# Patient Record
Sex: Female | Born: 1995 | Race: Black or African American | Hispanic: No | Marital: Single | Smoking: Never smoker
Health system: Southern US, Community
[De-identification: ages and names within clinical notes are randomized; demographics above are authoritative.]

## PROBLEM LIST (undated history)

## (undated) ENCOUNTER — Inpatient Hospital Stay: Payer: Self-pay

## (undated) DIAGNOSIS — F419 Anxiety disorder, unspecified: Secondary | ICD-10-CM

## (undated) DIAGNOSIS — K219 Gastro-esophageal reflux disease without esophagitis: Secondary | ICD-10-CM

## (undated) DIAGNOSIS — D649 Anemia, unspecified: Secondary | ICD-10-CM

## (undated) DIAGNOSIS — F1991 Other psychoactive substance use, unspecified, in remission: Secondary | ICD-10-CM

## (undated) DIAGNOSIS — F329 Major depressive disorder, single episode, unspecified: Secondary | ICD-10-CM

## (undated) DIAGNOSIS — Z8619 Personal history of other infectious and parasitic diseases: Secondary | ICD-10-CM

## (undated) DIAGNOSIS — R2990 Unspecified symptoms and signs involving the nervous system: Secondary | ICD-10-CM

## (undated) DIAGNOSIS — Z87898 Personal history of other specified conditions: Secondary | ICD-10-CM

## (undated) DIAGNOSIS — G459 Transient cerebral ischemic attack, unspecified: Secondary | ICD-10-CM

## (undated) DIAGNOSIS — K8020 Calculus of gallbladder without cholecystitis without obstruction: Secondary | ICD-10-CM

## (undated) DIAGNOSIS — F32A Depression, unspecified: Secondary | ICD-10-CM

## (undated) DIAGNOSIS — M7918 Myalgia, other site: Secondary | ICD-10-CM

## (undated) HISTORY — PX: NO PAST SURGERIES: SHX2092

## (undated) HISTORY — DX: Other psychoactive substance use, unspecified, in remission: F19.91

## (undated) HISTORY — DX: Personal history of other specified conditions: Z87.898

---
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
## 2013-01-03 ENCOUNTER — Emergency Department: Payer: Self-pay | Admitting: Emergency Medicine

## 2013-01-07 ENCOUNTER — Emergency Department: Payer: Self-pay | Admitting: Emergency Medicine

## 2013-01-09 LAB — BETA STREP CULTURE(ARMC)

## 2013-04-30 ENCOUNTER — Emergency Department: Payer: Self-pay | Admitting: Emergency Medicine

## 2013-04-30 LAB — CBC WITH DIFFERENTIAL/PLATELET
BASOS ABS: 0.1 10*3/uL (ref 0.0–0.1)
BASOS PCT: 0.4 %
EOS ABS: 0 10*3/uL (ref 0.0–0.7)
Eosinophil %: 0.1 %
HCT: 36.7 % (ref 35.0–47.0)
HGB: 12.1 g/dL (ref 12.0–16.0)
LYMPHS PCT: 7.8 %
Lymphocyte #: 1.4 10*3/uL (ref 1.0–3.6)
MCH: 25.8 pg — ABNORMAL LOW (ref 26.0–34.0)
MCHC: 32.9 g/dL (ref 32.0–36.0)
MCV: 78 fL — ABNORMAL LOW (ref 80–100)
MONOS PCT: 7.4 %
Monocyte #: 1.3 x10 3/mm — ABNORMAL HIGH (ref 0.2–0.9)
NEUTROS PCT: 84.3 %
Neutrophil #: 15.2 10*3/uL — ABNORMAL HIGH (ref 1.4–6.5)
Platelet: 342 10*3/uL (ref 150–440)
RBC: 4.69 10*6/uL (ref 3.80–5.20)
RDW: 14.1 % (ref 11.5–14.5)
WBC: 18 10*3/uL — AB (ref 3.6–11.0)

## 2013-04-30 LAB — COMPREHENSIVE METABOLIC PANEL
AST: 31 U/L — AB (ref 0–26)
Albumin: 3.3 g/dL — ABNORMAL LOW (ref 3.8–5.6)
Alkaline Phosphatase: 130 U/L — ABNORMAL HIGH
Anion Gap: 6 — ABNORMAL LOW (ref 7–16)
BILIRUBIN TOTAL: 0.4 mg/dL (ref 0.2–1.0)
BUN: 8 mg/dL (ref 7–18)
CO2: 24 mmol/L (ref 21–32)
CREATININE: 0.87 mg/dL (ref 0.60–1.30)
Calcium, Total: 8.9 mg/dL — ABNORMAL LOW (ref 9.0–10.7)
Chloride: 103 mmol/L (ref 98–107)
EGFR (African American): >60
EGFR (Non-African Amer.): >60
GLUCOSE: 108 mg/dL — AB (ref 65–99)
OSMOLALITY: 265 (ref 275–301)
Potassium: 3.4 mmol/L — ABNORMAL LOW (ref 3.5–5.1)
SGPT (ALT): 57 U/L (ref 12–78)
Sodium: 133 mmol/L — ABNORMAL LOW (ref 136–145)
TOTAL PROTEIN: 8.9 g/dL — AB (ref 6.4–8.6)

## 2013-04-30 LAB — GC/CHLAMYDIA PROBE AMP

## 2013-04-30 LAB — MONONUCLEOSIS SCREEN: Mono Test: NEGATIVE

## 2013-05-02 LAB — BETA STREP CULTURE(ARMC)

## 2013-05-11 ENCOUNTER — Emergency Department: Payer: Self-pay | Admitting: Emergency Medicine

## 2013-05-11 LAB — CBC
HCT: 33.4 % — ABNORMAL LOW (ref 35.0–47.0)
HGB: 11 g/dL — ABNORMAL LOW (ref 12.0–16.0)
MCH: 25.7 pg — AB (ref 26.0–34.0)
MCHC: 32.8 g/dL (ref 32.0–36.0)
MCV: 78 fL — AB (ref 80–100)
PLATELETS: 351 10*3/uL (ref 150–440)
RBC: 4.26 10*6/uL (ref 3.80–5.20)
RDW: 14.1 % (ref 11.5–14.5)
WBC: 13.5 10*3/uL — AB (ref 3.6–11.0)

## 2013-05-11 LAB — MONONUCLEOSIS SCREEN: Mono Test: NEGATIVE

## 2014-02-08 ENCOUNTER — Emergency Department: Payer: Self-pay | Admitting: Emergency Medicine

## 2014-02-08 LAB — COMPREHENSIVE METABOLIC PANEL
ANION GAP: 9 (ref 7–16)
Albumin: 2.9 g/dL — ABNORMAL LOW (ref 3.4–5.0)
Alkaline Phosphatase: 121 U/L — ABNORMAL HIGH
BILIRUBIN TOTAL: 0.6 mg/dL (ref 0.2–1.0)
BUN: 6 mg/dL — ABNORMAL LOW (ref 7–18)
CHLORIDE: 99 mmol/L (ref 98–107)
CO2: 27 mmol/L (ref 21–32)
CREATININE: 1.07 mg/dL (ref 0.60–1.30)
Calcium, Total: 9.2 mg/dL (ref 8.5–10.1)
EGFR (Non-African Amer.): >60
Glucose: 122 mg/dL — ABNORMAL HIGH (ref 65–99)
OSMOLALITY: 269 (ref 275–301)
POTASSIUM: 3.2 mmol/L — AB (ref 3.5–5.1)
SGOT(AST): 33 U/L (ref 15–37)
SGPT (ALT): 52 U/L
SODIUM: 135 mmol/L — AB (ref 136–145)
Total Protein: 8.9 g/dL — ABNORMAL HIGH (ref 6.4–8.2)

## 2014-02-08 LAB — URINALYSIS, COMPLETE
Glucose,UR: NEGATIVE mg/dL (ref 0–75)
Ketone: NEGATIVE
Leukocyte Esterase: NEGATIVE
Nitrite: NEGATIVE
Ph: 5 (ref 4.5–8.0)
Protein: 100
RBC,UR: 13 /HPF (ref 0–5)
Specific Gravity: 1.031 (ref 1.003–1.030)
Squamous Epithelial: 1
WBC UR: 10 /HPF (ref 0–5)

## 2014-02-08 LAB — CBC WITH DIFFERENTIAL/PLATELET
BASOS PCT: 0.2 %
Basophil #: 0 10*3/uL (ref 0.0–0.1)
Eosinophil #: 0 10*3/uL (ref 0.0–0.7)
Eosinophil %: 0 %
HCT: 34.1 % — ABNORMAL LOW (ref 35.0–47.0)
HGB: 11 g/dL — AB (ref 12.0–16.0)
LYMPHS PCT: 6.5 %
Lymphocyte #: 0.9 10*3/uL — ABNORMAL LOW (ref 1.0–3.6)
MCH: 25.3 pg — ABNORMAL LOW (ref 26.0–34.0)
MCHC: 32.3 g/dL (ref 32.0–36.0)
MCV: 78 fL — ABNORMAL LOW (ref 80–100)
Monocyte #: 1.4 x10 3/mm — ABNORMAL HIGH (ref 0.2–0.9)
Monocyte %: 10 %
NEUTROS PCT: 83.3 %
Neutrophil #: 11.7 10*3/uL — ABNORMAL HIGH (ref 1.4–6.5)
Platelet: 267 10*3/uL (ref 150–440)
RBC: 4.36 10*6/uL (ref 3.80–5.20)
RDW: 14.5 % (ref 11.5–14.5)
WBC: 14.1 10*3/uL — ABNORMAL HIGH (ref 3.6–11.0)

## 2014-02-11 LAB — BETA STREP CULTURE(ARMC)

## 2014-04-18 ENCOUNTER — Emergency Department: Payer: Self-pay | Admitting: Emergency Medicine

## 2014-06-13 NOTE — H&P (Signed)
L&D Evaluation:  History:   HPI 19 yo G1 [facility]648[facility]0d [facility] presenting with abdominal pain and labial swelling.  Also initially reported decreased fetal movement but has felt good movement ever since presenting to L&D.  No LOF, no VB.  Pain described as bilateral flank pain radiating into groin, exacerbated by standing and walking.  Radiating into vagina as well.    Presents with abdominal pain, vaginal swelling    Patient's Medical History No Chronic Illness    Patient's Surgical History none    Medications Pre Natal Vitamins    Allergies NKDA   ROS:   ROS All systems were reviewed.  HEENT, CNS, GI, GU, Respiratory, CV, Renal and Musculoskeletal systems were found to be normal.   Exam:   Vital Signs stable    Urine Protein UA    General no apparent distress    Mental Status clear    Abdomen gravid, non-tender    Estimated Fetal Weight Average for gestational age    Back no CVAT    Edema No lower extremity edema, some right labial edema, no erythema, no tenderness, no fluctulance.  Appears consistent with dependent edema  as opposed to infection    FHT normal rate with no decels    Ucx absent   Impression:   Impression Round ligament pain, labial swelling   Plan:   Comments 1) Round ligament pain - discussed supportive measures, warm packs, tylenol 2) Labial swelling - looks consistent with dependent edema.  Discussed cold compressed pelvic rest to avoid irritating further.  If patient notes worsening of swelling, or development of fevers, discharge discussed representing to re-evaluation 3) UA - pending to r/o possible UTI as contributing to patient abomdinal pain. 4) F/U 11/4    Follow Up Appointment already scheduled   Electronic Signatures for Addendum Section:  Lorrene ReidStaebler, Kasandra Fehr M (MD) (Signed Addendum 20-Oct-13 17:43)  Rx macrobid for possible UTI, UCx sent   Electronic Signatures: Lorrene ReidStaebler, Joshoa Shawler M (MD)  (Signed 20-Oct-13  17:43)  Authored: L&D Evaluation   Last Updated: 20-Oct-13 17:43 by Lorrene ReidStaebler, Roneisha Stern M (MD)

## 2014-06-13 NOTE — H&P (Signed)
L&D Evaluation:  History Expanded:   HPI 19 yo [facility] 3543[facility]6d [facility] with worsening contractions since 0500 and spontaneous rupture of membranes here on labo floor. No vaginal bleeding.   [facility]. No complications.    Gravida 1    Term 0    [facility]    Presents with contractions    Patient's Medical History No Chronic Illness    Patient's Surgical History none    Medications Pre Natal Vitamins    Allergies NKDA    Social History none    Family History Non-Contributory   ROS:   ROS All systems were reviewed.  HEENT, CNS, GI, GU, Respiratory, CV, Renal and Musculoskeletal systems were found to be normal.   Exam:   Vital Signs stable    General no apparent distress    Mental Status clear    Chest clear    Heart normal sinus rhythm    Abdomen gravid, tender with contractions    Estimated Fetal Weight Average for gestational age    Back no CVAT    Edema no edema    Reflexes 1+    Pelvic no external lesions, 6/100/0, spontaneous rupture of membranes forebag    Mebranes Ruptured    Description clear    FHT normal rate with no decels    Fetal Heart Rate 130    Ucx regular, absent    Ucx Frequency 2 min    Skin dry   Impression:   Impression active labor   Plan:   Plan EFM/NST, monitor contractions and for cervical change, fluids    Comments Epidural and pain meds Counseled on labor expectations    Follow Up Appointment already scheduled   Electronic Signatures: Letitia LibraHarris, Robert Paul (MD)  (Signed 21-Dec-13 08:57)  Authored: L&D Evaluation   Last Updated: 21-Dec-13 08:57 by Letitia LibraHarris, Robert Paul (MD)

## 2014-06-13 NOTE — H&P (Signed)
L&D Evaluation:  History:   HPI 19 yo G1P0 @ 22wks EDC 01/25/12 by 9 wk uls presents with vaginal bleeding.  She had spotting with wiping this morning.  Reports bleeding significantly increased later and she passed a clot.  Had bleeding that ran down her leg.  Denies sex or anything else in her vagina.  +FM.  Denies pain or other symptoms.    Presents with vaginal bleeding    Patient's Medical History No Chronic Illness    Patient's Surgical History none    Medications Pre Natal Vitamins    Allergies NKDA    Social History none    Family History Breast cancer, DM, thyroid disease   ROS:   ROS see HPI, all others reviewed and neg   Exam:   Vital Signs stable    Urine Protein not completed    General no apparent distress    Mental Status clear    Abdomen gravid, non-tender    Edema no edema    Pelvic cervix closed and thick, no bleeding in vagina or cervix, no pooling, neg nitrazine.  1cm superficial laceration left labia minora with active bleeding    Mebranes Intact    FHT 150s, reassuring for EGA    Ucx absent    Skin dry   Impression:   Impression Labial laceration   Plan:   Comments Pt reported that she washed herself vigorously during bathing yesterday and "scratched herself".  Denies recent sex.  Conservative management.  Superficial lac with no need for repair.  Keep area clean and dry.  Return with worsening bleeding or other concerns.   Electronic Signatures: Senaida LangeWeaver-Lee, Jessica Randall (MD)  (Signed 17-Aug-13 18:00)  Authored: L&D Evaluation   Last Updated: 17-Aug-13 18:00 by Senaida LangeWeaver-Lee, Jessica Randall (MD)

## 2014-08-08 ENCOUNTER — Encounter: Payer: Self-pay | Admitting: Emergency Medicine

## 2014-08-08 ENCOUNTER — Emergency Department: Payer: Medicaid Other

## 2014-08-08 ENCOUNTER — Emergency Department
Admission: EM | Admit: 2014-08-08 | Discharge: 2014-08-08 | Disposition: A | Discharge status: Home / Self Care | Payer: Medicaid Other | Attending: Emergency Medicine | Admitting: Emergency Medicine

## 2014-08-08 DIAGNOSIS — Z3A08 8 weeks gestation of pregnancy: Secondary | ICD-10-CM | POA: Diagnosis not present

## 2014-08-08 DIAGNOSIS — O200 Threatened abortion: Secondary | ICD-10-CM | POA: Insufficient documentation

## 2014-08-08 DIAGNOSIS — O209 Hemorrhage in early pregnancy, unspecified: Secondary | ICD-10-CM | POA: Diagnosis present

## 2014-08-08 LAB — ABO/RH: ABO/RH(D): A POS

## 2014-08-08 LAB — CHLAMYDIA/NGC RT PCR (ARMC ONLY)
Chlamydia Tr: NOT DETECTED
N GONORRHOEAE: NOT DETECTED

## 2014-08-08 LAB — CBC WITH DIFFERENTIAL/PLATELET
Basophils Absolute: 0 10*3/uL (ref 0–0.1)
Basophils Relative: 0 %
EOS PCT: 1 %
Eosinophils Absolute: 0.1 10*3/uL (ref 0–0.7)
HCT: 34.1 % — ABNORMAL LOW (ref 35.0–47.0)
Hemoglobin: 11.3 g/dL — ABNORMAL LOW (ref 12.0–16.0)
Lymphocytes Relative: 17 %
Lymphs Abs: 1.7 10*3/uL (ref 1.0–3.6)
MCH: 25.7 pg — ABNORMAL LOW (ref 26.0–34.0)
MCHC: 33.3 g/dL (ref 32.0–36.0)
MCV: 77 fL — ABNORMAL LOW (ref 80.0–100.0)
Monocytes Absolute: 0.8 10*3/uL (ref 0.2–0.9)
Monocytes Relative: 8 %
NEUTROS PCT: 74 %
Neutro Abs: 7.4 10*3/uL — ABNORMAL HIGH (ref 1.4–6.5)
PLATELETS: 243 10*3/uL (ref 150–440)
RBC: 4.42 MIL/uL (ref 3.80–5.20)
RDW: 16 % — AB (ref 11.5–14.5)
WBC: 10 10*3/uL (ref 3.6–11.0)

## 2014-08-08 LAB — COMPREHENSIVE METABOLIC PANEL
ALBUMIN: 3.4 g/dL — AB (ref 3.5–5.0)
ALK PHOS: 53 U/L (ref 38–126)
ALT: 16 U/L (ref 14–54)
AST: 20 U/L (ref 15–41)
Anion gap: 7 (ref 5–15)
BUN: 7 mg/dL (ref 6–20)
CALCIUM: 9.1 mg/dL (ref 8.9–10.3)
CO2: 22 mmol/L (ref 22–32)
Chloride: 105 mmol/L (ref 101–111)
Creatinine, Ser: 0.6 mg/dL (ref 0.44–1.00)
GFR calc non Af Amer: >60 mL/min (ref >60)
Glucose, Bld: 82 mg/dL (ref 65–99)
Potassium: 3.6 mmol/L (ref 3.5–5.1)
Sodium: 134 mmol/L — ABNORMAL LOW (ref 135–145)
TOTAL PROTEIN: 7 g/dL (ref 6.5–8.1)
Total Bilirubin: 0.3 mg/dL (ref 0.3–1.2)

## 2014-08-08 LAB — WET PREP, GENITAL
Clue Cells Wet Prep HPF POC: NONE SEEN
Trich, Wet Prep: NONE SEEN
YEAST WET PREP: NONE SEEN

## 2014-08-08 LAB — TYPE AND SCREEN
ABO/RH(D): A POS
ANTIBODY SCREEN: NEGATIVE

## 2014-08-08 LAB — PREGNANCY, URINE: PREG TEST UR: POSITIVE — AB

## 2014-08-08 NOTE — ED Notes (Signed)
Patient reports light vaginal bleeding last two days that subsided. States bleeding today is like normal period. Patient states she had positive home pregnancy test. Did not have period last month, but unsure of how many weeks she is.

## 2014-08-08 NOTE — ED Provider Notes (Addendum)
[facility] Emergency Department Provider Note  ____________________________________________  Time seen: Approximately 1:15 PM  I have reviewed the triage vital signs and the nursing notes.   HISTORY  Chief Complaint Vaginal Bleeding    HPI Jessica Randall is a 19 y.o. female patient reports she missed her menstrual period last month and began having a little spotting 2 days ago stopped and has returned now with a little bit of cramping and bleeding about as much as as a normal menstrual period. She has not had this before   History reviewed. No pertinent past medical history.  There are no active problems to display for this patient.   History reviewed. No pertinent past surgical history.  No current outpatient prescriptions on file.  Allergies Review of patient's allergies indicates no known allergies.  No family history on file.  Social History History  Substance Use Topics  . Smoking status: Never Smoker   . Smokeless tobacco: Never Used  . Alcohol Use: No     Comment: occ.     Review of Systems Constitutional: No fever/chills Eyes: No visual changes. ENT: No sore throat. Cardiovascular: Denies chest pain. Respiratory: Denies shortness of breath. Gastrointestinal: No abdominal pain.  No nausea, no vomiting.  No diarrhea.  No constipation. Genitourinary: Negative for dysuria. Musculoskeletal: Negative for back pain. Skin: Negative for rash. Neurological: Negative for headaches, focal weakness or numbness.  10-point ROS otherwise negative.  ____________________________________________   PHYSICAL EXAM:  VITAL SIGNS: ED Triage Vitals  Enc Vitals Group     BP 08/08/14 1224 117/51 mmHg     Pulse Rate 08/08/14 1224 82     Resp 08/08/14 1224 18     Temp 08/08/14 1224 98.2 F (36.8 C)     Temp Source 08/08/14 1224 Oral     SpO2 08/08/14 1224 99 %     Weight 08/08/14 1224 170 lb (77.111 kg)     Height 08/08/14 1224 5\' 1"  (1.549 m)      Head Cir --      Peak Flow --      Pain Score 08/08/14 1225 5     Pain Loc --      Pain Edu? --      Excl. in GC? --     Constitutional: Alert and oriented. Well appearing and in no acute distress. Eyes: Conjunctivae are normal. PERRL. EOMI. Head: Atraumatic. Nose: No congestion/rhinnorhea. Mouth/Throat: Mucous membranes are moist.  Oropharynx non-erythematous. Neck: No stridor. Cardiovascular: Normal rate, regular rhythm. Grossly normal heart sounds.  Good peripheral circulation. Respiratory: Normal respiratory effort.  No retractions. Lungs CTAB. Gastrointestinal: Soft and nontender. No distention. No abdominal bruits. No CVA tenderness. Genitourinary: Normal introitus and vagina there is a very small amount of blood at the os. No discharge. Bimanual shows a closed cervix is no cervical motion tenderness uterus is slightly enlarged. There are no masses on palpation. Musculoskeletal: No lower extremity tenderness nor edema.  No joint effusions. Neurologic:  Normal speech and language. No gross focal neurologic deficits are appreciated. Speech is normal. No gait instability. Skin:  Skin is warm, dry and intact. No rash noted. Psychiatric: Mood and affect are normal. Speech and behavior are normal.  ____________________________________________   LABS (all labs ordered are listed, but only abnormal results are displayed)  Labs Reviewed  WET PREP, GENITAL - Abnormal; Notable for the following:    WBC, Wet Prep HPF POC FEW (*)    All other components within normal limits  COMPREHENSIVE METABOLIC  PANEL - Abnormal; Notable for the following:    Sodium 134 (*)    Albumin 3.4 (*)    All other components within normal limits  CBC WITH DIFFERENTIAL/PLATELET - Abnormal; Notable for the following:    Hemoglobin 11.3 (*)    HCT 34.1 (*)    MCV 77.0 (*)    MCH 25.7 (*)    RDW 16.0 (*)    Neutro Abs 7.4 (*)    All other components within normal limits  PREGNANCY, URINE - Abnormal;  Notable for the following:    Preg Test, Ur POSITIVE (*)    All other components within normal limits  CHLAMYDIA/NGC RT PCR ([facility] ONLY)  HCG, QUANTITATIVE, PREGNANCY  TYPE AND SCREEN  ABO/RH   ____________________________________________  EKG   ____________________________________________  RADIOLOGY  Ultrasound shows a normal IUP at 7 weeks and 6 days with no acute findings discussed with patient the fact that she will be diagnosed as having a threatened miscarriage will need to follow-up with OB Dr. Dalbert Garnet who could not O clinic is on-call only for the patient to Dr. Dalbert Garnet structure for threatened miscarriage provided ____________________________________________   PROCEDURES    ____________________________________________   INITIAL IMPRESSION / ASSESSMENT AND PLAN / ED COURSE  Pertinent labs & imaging results that were available during my care of the patient were reviewed by me and considered in my medical decision making (see chart for details).   ____________________________________________   FINAL CLINICAL IMPRESSION(S) / ED DIAGNOSES  Final diagnoses:  Threatened miscarriage in early pregnancy      Arnaldo Natal, MD 08/08/14 1618  Scheduled Dr. Dalbert Garnet who wishes to make sure that the patient's taking prenatal vitamins with folic acid I have given her prescription for that  Arnaldo Natal, MD 08/08/14 2350

## 2014-08-08 NOTE — Discharge Instructions (Signed)

## 2014-08-23 ENCOUNTER — Emergency Department
Admission: EM | Admit: 2014-08-23 | Discharge: 2014-08-23 | Disposition: A | Discharge status: Home / Self Care | Payer: Medicaid Other | Attending: Emergency Medicine | Admitting: Emergency Medicine

## 2014-08-23 ENCOUNTER — Encounter: Payer: Self-pay | Admitting: Urgent Care

## 2014-08-23 DIAGNOSIS — O200 Threatened abortion: Secondary | ICD-10-CM | POA: Insufficient documentation

## 2014-08-23 DIAGNOSIS — B9689 Other specified bacterial agents as the cause of diseases classified elsewhere: Secondary | ICD-10-CM

## 2014-08-23 DIAGNOSIS — O23591 Infection of other part of genital tract in pregnancy, first trimester: Secondary | ICD-10-CM | POA: Insufficient documentation

## 2014-08-23 DIAGNOSIS — O209 Hemorrhage in early pregnancy, unspecified: Secondary | ICD-10-CM | POA: Diagnosis present

## 2014-08-23 DIAGNOSIS — Z3A12 12 weeks gestation of pregnancy: Secondary | ICD-10-CM | POA: Diagnosis not present

## 2014-08-23 DIAGNOSIS — O2341 Unspecified infection of urinary tract in pregnancy, first trimester: Secondary | ICD-10-CM | POA: Diagnosis not present

## 2014-08-23 DIAGNOSIS — N390 Urinary tract infection, site not specified: Secondary | ICD-10-CM

## 2014-08-23 DIAGNOSIS — N760 Acute vaginitis: Secondary | ICD-10-CM

## 2014-08-23 LAB — CBC
HEMATOCRIT: 34.4 % — AB (ref 35.0–47.0)
Hemoglobin: 11.5 g/dL — ABNORMAL LOW (ref 12.0–16.0)
MCH: 25.6 pg — ABNORMAL LOW (ref 26.0–34.0)
MCHC: 33.2 g/dL (ref 32.0–36.0)
MCV: 77.1 fL — ABNORMAL LOW (ref 80.0–100.0)
PLATELETS: 268 10*3/uL (ref 150–440)
RBC: 4.47 MIL/uL (ref 3.80–5.20)
RDW: 16.7 % — AB (ref 11.5–14.5)
WBC: 12.3 10*3/uL — AB (ref 3.6–11.0)

## 2014-08-23 LAB — URINALYSIS COMPLETE WITH MICROSCOPIC (ARMC ONLY)
Bacteria, UA: NONE SEEN
Bilirubin Urine: NEGATIVE
Glucose, UA: NEGATIVE mg/dL
Hgb urine dipstick: NEGATIVE
Ketones, ur: NEGATIVE mg/dL
NITRITE: NEGATIVE
PH: 6 (ref 5.0–8.0)
Protein, ur: 30 mg/dL — AB
Specific Gravity, Urine: 1.032 — ABNORMAL HIGH (ref 1.005–1.030)

## 2014-08-23 LAB — CHLAMYDIA/NGC RT PCR (ARMC ONLY)
CHLAMYDIA TR: NOT DETECTED
N GONORRHOEAE: NOT DETECTED

## 2014-08-23 LAB — BASIC METABOLIC PANEL
Anion gap: 7 (ref 5–15)
BUN: 8 mg/dL (ref 6–20)
CHLORIDE: 106 mmol/L (ref 101–111)
CO2: 22 mmol/L (ref 22–32)
Calcium: 9.3 mg/dL (ref 8.9–10.3)
Creatinine, Ser: 0.69 mg/dL (ref 0.44–1.00)
GLUCOSE: 91 mg/dL (ref 65–99)
Potassium: 3.2 mmol/L — ABNORMAL LOW (ref 3.5–5.1)
Sodium: 135 mmol/L (ref 135–145)

## 2014-08-23 LAB — WET PREP, GENITAL
TRICH WET PREP: NONE SEEN
Yeast Wet Prep HPF POC: NONE SEEN

## 2014-08-23 LAB — HCG, QUANTITATIVE, PREGNANCY: hCG, Beta Chain, Quant, S: 262045 m[IU]/mL — ABNORMAL HIGH (ref <5)

## 2014-08-23 MED ORDER — METRONIDAZOLE 500 MG PO TABS: 500.0000 mg | ORAL_TABLET | Freq: Two times a day (BID) | ORAL | Status: AC

## 2014-08-23 MED ORDER — METRONIDAZOLE 500 MG PO TABS
500.0000 mg | ORAL_TABLET | Freq: Once | ORAL | Status: AC
  Administered 2014-08-23: 500 mg via ORAL
  Filled 2014-08-23: qty 1

## 2014-08-23 MED ORDER — CEPHALEXIN 500 MG PO CAPS
500.0000 mg | ORAL_CAPSULE | Freq: Once | ORAL | Status: AC
  Administered 2014-08-23: 500 mg via ORAL
  Filled 2014-08-23: qty 1

## 2014-08-23 MED ORDER — CEPHALEXIN 500 MG PO CAPS: 500.0000 mg | ORAL_CAPSULE | Freq: Four times a day (QID) | ORAL | Status: AC

## 2014-08-23 NOTE — ED Provider Notes (Signed)
[facility] Emergency Department Provider Note  ____________________________________________  Time seen: Approximately 945 PM  I have reviewed the triage vital signs and the nursing notes.   HISTORY  Chief Complaint Vaginal Bleeding and Abdominal Cramping    HPI Jessica Randall is a 19 y.o. female who is a G2 P1 at 5812 weeks gestation who presents with lower abdominal cramping as well as vaginal spotting. She is also complaining of a white to yellow discharge. She has a previous history of chlamydia but is not concerned for STDs at this time.  She said that she was spotting earlier in the day today but it since has stopped. Denies any dysuria. York SpanielSaid that this happened several weeks ago and was evaluated at this emergency department. Has a prescription for by mouth vitamins at home but has not filled it. [facility].   History reviewed. No pertinent past medical history.  There are no active problems to display for this patient.   History reviewed. No pertinent past surgical history.  No current outpatient prescriptions on file.  Allergies Review of patient's allergies indicates no known allergies.  No family history on file.  Social History History  Substance Use Topics  . Smoking status: Never Smoker   . Smokeless tobacco: Never Used  . Alcohol Use: No     Comment: occ.     Review of Systems Constitutional: No fever/chills Eyes: No visual changes. ENT: No sore throat. Cardiovascular: Denies chest pain. Respiratory: Denies shortness of breath. Gastrointestinal:   No nausea, no vomiting.  No diarrhea.  No constipation. Genitourinary: Negative for dysuria. Musculoskeletal: Negative for back pain. Skin: Negative for rash. Neurological: Negative for headaches, focal weakness or numbness.  10-point ROS otherwise negative.  ____________________________________________   PHYSICAL  EXAM:  VITAL SIGNS: ED Triage Vitals  Enc Vitals Group     BP 08/23/14 2110 111/46 mmHg     Pulse Rate 08/23/14 2110 93     Resp 08/23/14 2110 18     Temp 08/23/14 2110 98.6 F (37 C)     Temp src --      SpO2 08/23/14 2110 100 %     Weight 08/23/14 2110 167 lb (75.751 kg)     Height 08/23/14 2110 4\' 11"  (1.499 m)     Head Cir --      Peak Flow --      Pain Score 08/23/14 2110 5     Pain Loc --      Pain Edu? --      Excl. in GC? --     Constitutional: Alert and oriented. Well appearing and in no acute distress. Eyes: Conjunctivae are normal. PERRL. EOMI. Head: Atraumatic. Nose: No congestion/rhinnorhea. Mouth/Throat: Mucous membranes are moist.  Oropharynx non-erythematous. Neck: No stridor.   Cardiovascular: Normal rate, regular rhythm. Grossly normal heart sounds.  Good peripheral circulation. Respiratory: Normal respiratory effort.  No retractions. Lungs CTAB. Gastrointestinal: Soft and nontender. No distention. No abdominal bruits. No CVA tenderness. Genitourinary: Normal external exam. Speculum exam without any bleeding. Small amount of mucus-like discharge around the cervix. Bimanual exam with a closed cervix. Mild uterine tenderness without any bogginess. No adnexal masses or tenderness to palpation. Musculoskeletal: No lower extremity tenderness nor edema.  No joint effusions. Neurologic:  Normal speech and language. No gross focal neurologic deficits are appreciated. No gait instability. Skin:  Skin is warm, dry and intact. No rash noted. Psychiatric: Mood and affect are  normal. Speech and behavior are normal.  ____________________________________________   LABS (all labs ordered are listed, but only abnormal results are displayed)  Labs Reviewed  WET PREP, GENITAL - Abnormal; Notable for the following:    Clue Cells Wet Prep HPF POC FEW (*)    WBC, Wet Prep HPF POC MODERATE (*)    All other components within normal limits  CBC - Abnormal; Notable for the  following:    WBC 12.3 (*)    Hemoglobin 11.5 (*)    HCT 34.4 (*)    MCV 77.1 (*)    MCH 25.6 (*)    RDW 16.7 (*)    All other components within normal limits  BASIC METABOLIC PANEL - Abnormal; Notable for the following:    Potassium 3.2 (*)    All other components within normal limits  URINALYSIS COMPLETEWITH MICROSCOPIC ([facility] ONLY) - Abnormal; Notable for the following:    Color, Urine YELLOW (*)    APPearance CLEAR (*)    Specific Gravity, Urine 1.032 (*)    Protein, ur 30 (*)    Leukocytes, UA TRACE (*)    Squamous Epithelial / LPF 0-5 (*)    All other components within normal limits  CHLAMYDIA/NGC RT PCR ([facility] ONLY)  URINE CULTURE  HCG, QUANTITATIVE, PREGNANCY   ____________________________________________  EKG   ____________________________________________  RADIOLOGY  Bedside ultrasound done with fetal heart rate of 158. ____________________________________________   PROCEDURES    ____________________________________________   INITIAL IMPRESSION / ASSESSMENT AND PLAN / ED COURSE  Pertinent labs & imaging results that were available during my care of the patient were reviewed by me and considered in my medical decision making (see chart for details).  Trace leukocytes as well as few clue cells seen on the wet mount. Because this is the patient's second visit with spotting and cramping I we'll treat for both urinary tract infection and bacterial vaginosis. Patient will have prescription filled for her prenatal vitamins when she picks up these medicines. ____________________________________________   FINAL CLINICAL IMPRESSION(S) / ED DIAGNOSES  Acute threatened abortion. Acute urinary tract infection. Acute bacterial vaginosis. Return visit.    Myrna Blazer, MD 08/23/14 [phone]

## 2014-08-23 NOTE — ED Notes (Signed)
Patient presents with c/o vaginal bleeding and abdominal cramping that began earlier today. Of note, patient is a 12 week G2P1 who has previously been seen and evaluated here and advised of a threatened miscarriage.

## 2014-08-25 LAB — URINE CULTURE: Culture: NO GROWTH

## 2014-09-17 ENCOUNTER — Emergency Department: Payer: Medicaid Other

## 2014-09-17 ENCOUNTER — Encounter: Payer: Self-pay | Admitting: Emergency Medicine

## 2014-09-17 ENCOUNTER — Emergency Department
Admission: EM | Admit: 2014-09-17 | Discharge: 2014-09-17 | Disposition: A | Discharge status: Home / Self Care | Payer: Medicaid Other | Attending: Emergency Medicine | Admitting: Emergency Medicine

## 2014-09-17 DIAGNOSIS — R102 Pelvic and perineal pain: Secondary | ICD-10-CM | POA: Diagnosis not present

## 2014-09-17 DIAGNOSIS — O9989 Other specified diseases and conditions complicating pregnancy, childbirth and the puerperium: Secondary | ICD-10-CM | POA: Insufficient documentation

## 2014-09-17 DIAGNOSIS — O26891 Other specified pregnancy related conditions, first trimester: Secondary | ICD-10-CM

## 2014-09-17 DIAGNOSIS — Z3A16 16 weeks gestation of pregnancy: Secondary | ICD-10-CM | POA: Diagnosis not present

## 2014-09-17 LAB — URINALYSIS COMPLETE WITH MICROSCOPIC (ARMC ONLY)
BILIRUBIN URINE: NEGATIVE
Glucose, UA: NEGATIVE mg/dL
Hgb urine dipstick: NEGATIVE
NITRITE: NEGATIVE
PROTEIN: 30 mg/dL — AB
SPECIFIC GRAVITY, URINE: 1.032 — AB (ref 1.005–1.030)
pH: 5 (ref 5.0–8.0)

## 2014-09-17 LAB — CBC
HCT: 33.6 % — ABNORMAL LOW (ref 35.0–47.0)
Hemoglobin: 11.2 g/dL — ABNORMAL LOW (ref 12.0–16.0)
MCH: 26.1 pg (ref 26.0–34.0)
MCHC: 33.3 g/dL (ref 32.0–36.0)
MCV: 78.2 fL — ABNORMAL LOW (ref 80.0–100.0)
PLATELETS: 278 10*3/uL (ref 150–440)
RBC: 4.3 MIL/uL (ref 3.80–5.20)
RDW: 17.4 % — ABNORMAL HIGH (ref 11.5–14.5)
WBC: 11.9 10*3/uL — ABNORMAL HIGH (ref 3.6–11.0)

## 2014-09-17 LAB — HCG, QUANTITATIVE, PREGNANCY: hCG, Beta Chain, Quant, S: 65292 m[IU]/mL — ABNORMAL HIGH (ref <5)

## 2014-09-17 NOTE — ED Notes (Signed)
Patient reports lower abd cramping for 3 days.  Denies vaginal bleeding.

## 2014-09-17 NOTE — ED Provider Notes (Addendum)
[facility] Emergency Department Provider Note  ____________________________________________  Time seen: 3:20 AM  I have reviewed the triage vital signs and the nursing notes.   HISTORY  Chief Complaint Abdominal Pain     HPI Jessica Randall is a 19 y.o. female who reports she is proximally [redacted] weeks pregnant. She reports having left abdominal discomfort. This is worse when she first wakes up in the morning and is noticeable when she is up walking. She is concerned about her pregnancy. She has been seen in the emergency department twice in the past 5 weeks. 5 weeks ago she had some light spotting and was diagnosed with possible miscarriage. She had a subsequent visit to the emergency department towards the end of July and diagnosed with a urinary tract infection.  She denies any bleeding. She feels like she is having some cramping.    History reviewed. No pertinent past medical history.  There are no active problems to display for this patient.   History reviewed. No pertinent past surgical history.  No current outpatient prescriptions on file.  Allergies Review of patient's allergies indicates no known allergies.  History reviewed. No pertinent family history.  Social History Social History  Substance Use Topics  . Smoking status: Never Smoker   . Smokeless tobacco: Never Used  . Alcohol Use: No     Comment: occ.     Review of Systems  Constitutional: Negative for fever. ENT: Negative for sore throat. Cardiovascular: Negative for chest pain. Respiratory: Negative for shortness of breath. Gastrointestinal: Left abdominal discomfort. Pregnant approximate 16 weeks. See history of present illness Genitourinary: Pregnant. UTI a few weeks ago but no dysuria currently. Pain in left pelvis. Musculoskeletal: No myalgias or injuries. Skin: Negative for rash. Neurological: Negative for headaches   10-point ROS otherwise  negative.  ____________________________________________   PHYSICAL EXAM:  VITAL SIGNS: ED Triage Vitals  Enc Vitals Group     BP 09/17/14 0157 121/67 mmHg     Pulse Rate 09/17/14 0157 102     Resp --      Temp 09/17/14 0157 98.3 F (36.8 C)     Temp Source 09/17/14 0157 Oral     SpO2 09/17/14 0157 99 %     Weight 09/17/14 0157 154 lb (69.854 kg)     Height 09/17/14 0157 4\' 11"  (1.499 m)     Head Cir --      Peak Flow --      Pain Score 09/17/14 0204 6     Pain Loc --      Pain Edu? --      Excl. in GC? --     Constitutional:  Alert and oriented. Well appearing and in no distress. ENT   Head: Normocephalic and atraumatic.   Nose: No congestion/rhinnorhea. Cardiovascular: Normal rate, regular rhythm, no murmur noted Respiratory:  Normal respiratory effort, no tachypnea.    Breath sounds are clear and equal bilaterally.  Gastrointestinal: Soft. Mild tenderness in the left abdomen, worse in the lower portion. She has a palpable uterus. There is no distention.  Back: No muscle spasm, no tenderness, no CVA tenderness. Musculoskeletal: No deformity noted. Nontender with normal range of motion in all extremities.  No noted edema. Neurologic:  Normal speech and language. No gross focal neurologic deficits are appreciated.  Skin:  Skin is warm, dry. No rash noted. Psychiatric: Mood and affect are normal. Speech and behavior are normal.  ____________________________________________    LABS (pertinent positives/negatives)  Labs Reviewed  CBC - Abnormal; Notable for the following:    WBC 11.9 (*)    Hemoglobin 11.2 (*)    HCT 33.6 (*)    MCV 78.2 (*)    RDW 17.4 (*)    All other components within normal limits  HCG, QUANTITATIVE, PREGNANCY - Abnormal; Notable for the following:    hCG, Beta Chain, Quant, [place] [address] (*)    All other components within normal limits  URINALYSIS COMPLETEWITH MICROSCOPIC ([facility] ONLY) - Abnormal; Notable for the following:    Color, Urine  YELLOW (*)    APPearance CLEAR (*)    Ketones, ur 2+ (*)    Specific Gravity, Urine 1.032 (*)    Protein, ur 30 (*)    Leukocytes, UA 1+ (*)    Bacteria, UA RARE (*)    Squamous Epithelial / LPF 0-5 (*)    All other components within normal limits     ____________________________________________    RADIOLOGY  Pelvic ultrasound, OB:  IMPRESSION: Single live intrauterine pregnancy noted, with a crown-rump length of 7.7 cm, corresponding to a gestational age of [redacted] weeks 5 days. This matches the gestational age of [redacted] weeks 4 days by the first ultrasound, reflecting an estimated date of delivery of March 21, 2015.   ____________________________________________   INITIAL IMPRESSION / ASSESSMENT AND PLAN / ED COURSE  Pertinent labs & imaging results that were available during my care of the patient were reviewed by me and considered in my medical decision making (see chart for details).  Overall well-appearing 19 year old female G2, P1, [redacted] weeks pregnant, with left-sided abdominal pain.  She is concerned about pregnancy. We attempted to obtain Doppler fetal heart tones but were not able to detect them. We will obtain an ultrasound for further evaluation of the pregnancy.  ----------------------------------------- 6:40 AM on 09/17/2014 -----------------------------------------  Ultrasound shows a single live IUP consistent with 13 weeks 5 days with fetal heart rate of 155.  Reassessment of the patient finds her overall comfortable. We will discharge her for follow-up with [facility].  ____________________________________________   FINAL CLINICAL IMPRESSION(S) / ED DIAGNOSES  Final diagnoses:  Pregnancy related pelvic pain, antepartum, first trimester      Darien Ramus, MD 09/17/14 1308  Darien Ramus, MD 09/17/14 [phone]

## 2014-09-17 NOTE — Discharge Instructions (Signed)
Your ultrasound looks normal. 13 weeks 5 days with normal fetal heart activity. Follow-up with [facility]. Return to the emergency department if you have worsening pain or other urgent concerns.  Abdominal Pain During Pregnancy Abdominal pain is common in pregnancy. Most of the time, it does not cause harm. There are many causes of abdominal pain. Some causes are more serious than others. Some of the causes of abdominal pain in pregnancy are easily diagnosed. Occasionally, the diagnosis takes time to understand. Other times, the cause is not determined. Abdominal pain can be a sign that something is very wrong with the pregnancy, or the pain may have nothing to do with the pregnancy at all. For this reason, always tell your health care provider if you have any abdominal discomfort. HOME CARE INSTRUCTIONS  Monitor your abdominal pain for any changes. The following actions may help to alleviate any discomfort you are experiencing:  Do not have sexual intercourse or put anything in your vagina until your symptoms go away completely.  Get plenty of rest until your pain improves.  Drink clear fluids if you feel nauseous. Avoid solid food as long as you are uncomfortable or nauseous.  Only take over-the-counter or prescription medicine as directed by your health care provider.  Keep all follow-up appointments with your health care provider. SEEK IMMEDIATE MEDICAL CARE IF:  You are bleeding, leaking fluid, or passing tissue from the vagina.  You have increasing pain or cramping.  You have persistent vomiting.  You have painful or bloody urination.  You have a fever.  You notice a decrease in your baby's movements.  You have extreme weakness or feel faint.  You have shortness of breath, with or without abdominal pain.  You develop a severe headache with abdominal pain.  You have abnormal vaginal discharge with abdominal pain.  You have persistent diarrhea.  You have abdominal pain  that continues even after rest, or gets worse. MAKE SURE YOU:   Understand these instructions.  Will watch your condition.  Will get help right away if you are not doing well or get worse. Document Released: 01/20/2005 Document Revised: 11/10/2012 Document Reviewed: 08/19/2012 [facility] Patient Information 2015 [place], [place]. This information is not intended to replace advice given to you by your health care provider. Make sure you discuss any questions you have with your health care provider.

## 2014-11-10 LAB — OB RESULTS CONSOLE RUBELLA ANTIBODY, IGM: Rubella: IMMUNE

## 2014-11-27 ENCOUNTER — Other Ambulatory Visit: Payer: Self-pay | Admitting: Physician Assistant

## 2014-11-27 DIAGNOSIS — Z3490 Encounter for supervision of normal pregnancy, unspecified, unspecified trimester: Secondary | ICD-10-CM

## 2014-12-01 ENCOUNTER — Other Ambulatory Visit: Payer: Self-pay | Admitting: Physician Assistant

## 2014-12-01 DIAGNOSIS — Z3689 Encounter for other specified antenatal screening: Secondary | ICD-10-CM

## 2014-12-06 ENCOUNTER — Ambulatory Visit: Payer: Medicaid Other

## 2014-12-25 LAB — OB RESULTS CONSOLE HIV ANTIBODY (ROUTINE TESTING): HIV: NONREACTIVE

## 2014-12-25 LAB — OB RESULTS CONSOLE RPR: RPR: NONREACTIVE

## 2015-01-06 ENCOUNTER — Observation Stay
Admission: EM | Admit: 2015-01-06 | Discharge: 2015-01-06 | Disposition: A | Discharge status: Home / Self Care | Payer: Medicaid Other | Attending: Obstetrics & Gynecology | Admitting: Obstetrics & Gynecology

## 2015-01-06 DIAGNOSIS — O26893 Other specified pregnancy related conditions, third trimester: Principal | ICD-10-CM | POA: Insufficient documentation

## 2015-01-06 DIAGNOSIS — Z3A29 29 weeks gestation of pregnancy: Secondary | ICD-10-CM | POA: Diagnosis not present

## 2015-01-06 DIAGNOSIS — M549 Dorsalgia, unspecified: Secondary | ICD-10-CM | POA: Diagnosis present

## 2015-01-06 NOTE — OB Triage Note (Signed)
[name] here with c/o back pain and contractions, states every 10 min for past few hours, denies bleeding, LOF.

## 2015-01-07 NOTE — Discharge Summary (Signed)
Patient presented for evaluation of back pain and contractions @ 29.4 weeks. Upon being put on the monitor there were no contractions.  Patient stated that her pain and concern had resolved.   I reviewed her vital signs and fetal tracing, both of which were reassuring.  Patient was discharged as she was not laboring.  Baseline FHT 130, mod + 10x10s NST interpretation: Reactive.  Jessica Plumberhelsea Glorious Flicker, MD Attending Obstetrician and Gynecologist [facility]

## 2015-01-09 ENCOUNTER — Inpatient Hospital Stay: Payer: Medicaid Other | Admitting: Oncology

## 2015-01-12 ENCOUNTER — Observation Stay
Admission: EM | Admit: 2015-01-12 | Discharge: 2015-01-12 | Disposition: A | Discharge status: Home / Self Care | Payer: Medicaid Other | Attending: Obstetrics and Gynecology | Admitting: Obstetrics and Gynecology

## 2015-01-12 DIAGNOSIS — N6489 Other specified disorders of breast: Secondary | ICD-10-CM | POA: Diagnosis present

## 2015-01-12 DIAGNOSIS — O26893 Other specified pregnancy related conditions, third trimester: Principal | ICD-10-CM | POA: Insufficient documentation

## 2015-01-12 DIAGNOSIS — Z3A30 30 weeks gestation of pregnancy: Secondary | ICD-10-CM | POA: Diagnosis not present

## 2015-01-12 DIAGNOSIS — R109 Unspecified abdominal pain: Secondary | ICD-10-CM | POA: Diagnosis present

## 2015-01-12 LAB — CHLAMYDIA/NGC RT PCR (ARMC ONLY)
Chlamydia Tr: NOT DETECTED
N gonorrhoeae: NOT DETECTED

## 2015-01-12 NOTE — Final Progress Note (Signed)
Physician Final Progress Note  Patient ID: Jessica Randall MRN: [id number] DOB/AGE: 1995-10-27 19 y.o.  Admit date: 01/12/2015 Admitting provider: Conard NovakStephen D Tasnim Balentine, MD Discharge date: 01/12/2015   Admission Diagnoses: Chlamydia exposure in pregnancy  Discharge Diagnoses:  Active Problems:   Labor and delivery, indication for care  Chlamydia exposure, no evidence of infection on exam and testing  Consults: None  Significant Findings/ Diagnostic Studies: microbiology: Gonorrhea/chlamydia NAAT negative  See progress note from Tresea MallJane Gledhill, CNM, who performed her exam on arrival to L&D.  Procedures:  NST reactive  Discharge Condition: good  Disposition: 01-Home or Self Care  Diet: Regular diet  Discharge Activity: Activity as tolerated     Medication List    Notice    You have not been prescribed any medications.       Total time spent taking care of this patient: 30 minutes  Signed: Conard NovakJackson, Jolene Guyett D, MD 01/12/2015, 6:31 PM

## 2015-01-12 NOTE — Progress Notes (Signed)
Patient ID: Jessica StampsDalisha Barnett, female   DOB: 12/04/1995, 19 y.o.   MRN: [id number]   S: Pt here with complaint of vaginal discharge, abdominal cramps  O: BP 108/83 mmHg  Pulse 97  Temp(Src) 98.2 F (36.8 C) (Oral)  LMP 06/12/2014 (Approximate) General: NAD Abdomen: Gravid, NT Genital: Speculum exam, leukorrhea, cervix closed  A: 19 y.o. G2P0 at 8563w4d presents with the above.  Noted recent diagnosis of chlamydia in partner.  P: Aptima screen for GC/CT   This patient and plan were discussed with Dr Jean RosenthalJackson  01/12/2015   Tresea MallGLEDHILL,Zacheriah Stumpe, CNM 01/12/2015 4:51 PM

## 2015-01-12 NOTE — OB Triage Note (Signed)
Pt placed on monitor and oriented to obs rm 2. C/o contractions/cramping this morning and white creamy vaginal discharge. Sexual partner was dx with chlamydia 01/12/2015 would like to be checked.

## 2015-01-22 ENCOUNTER — Inpatient Hospital Stay: Payer: Medicaid Other | Attending: Oncology | Admitting: Oncology

## 2015-02-04 NOTE — L&D Delivery Note (Signed)
Delivery Note At 4:46 PM a viable unspecified sex was delivered via Vaginal, Spontaneous Delivery (Presentation: ROA ).  APGAR: 8, 8; weight 6 lb 13 oz (3090 g).   Placenta status: Intact, Spontaneous.  Cord: 3 vessels with the following complications: none.  Cord pH: NA  Called to see patient.  Mom pushed to delivery viable female infant.  The head followed by shoulders, which delivered without difficulty, and the rest of the body.  No nuchal cord noted.  Baby to mom's chest.  Cord clamped and cut after 5 min delay.  Cord blood obtained.  Placenta delivered spontaneously, intact, with a 3-vessel cord.  Perineum intact.  All counts correct.  Hemostasis obtained with IV pitocin and fundal massage. EBL 100 mL.    Anesthesia: Epidural  Episiotomy: None Lacerations: None Suture Repair: None Est. Blood Loss (mL):   Mom to postpartum.  Baby to Couplet care / Skin to Skin.  Tresea Mall, CNM  Dr Jean Rosenthal was present for observation/assistance with the delivery.

## 2015-03-07 LAB — OB RESULTS CONSOLE GBS: STREP GROUP B AG: POSITIVE

## 2015-03-07 LAB — OB RESULTS CONSOLE GC/CHLAMYDIA
Chlamydia: NEGATIVE
Gonorrhea: NEGATIVE

## 2015-03-19 ENCOUNTER — Observation Stay
Admission: EM | Admit: 2015-03-19 | Discharge: 2015-03-19 | Disposition: A | Discharge status: Home / Self Care | Payer: Medicaid Other | Attending: Obstetrics & Gynecology | Admitting: Obstetrics & Gynecology

## 2015-03-19 DIAGNOSIS — Z3A40 40 weeks gestation of pregnancy: Secondary | ICD-10-CM | POA: Insufficient documentation

## 2015-03-19 DIAGNOSIS — O368130 Decreased fetal movements, third trimester, not applicable or unspecified: Secondary | ICD-10-CM | POA: Diagnosis present

## 2015-03-19 DIAGNOSIS — O471 False labor at or after 37 completed weeks of gestation: Secondary | ICD-10-CM | POA: Diagnosis not present

## 2015-03-19 HISTORY — DX: Anemia, unspecified: D64.9

## 2015-03-19 MED ORDER — ZOLPIDEM TARTRATE 5 MG PO TABS
ORAL_TABLET | ORAL | Status: AC
  Administered 2015-03-19: 5 mg via ORAL
  Filled 2015-03-19: qty 1

## 2015-03-19 MED ORDER — ZOLPIDEM TARTRATE 5 MG PO TABS
5.0000 mg | ORAL_TABLET | Freq: Every evening | ORAL | Status: DC | PRN
  Administered 2015-03-19: 5 mg via ORAL

## 2015-03-19 NOTE — Discharge Summary (Signed)
Obstetric History and Physical  Jessica Randall is a 20 y.o. G2P1001 with Estimated Date of Delivery: 03/19/15 per LMP and 2nd trimester [place] who presents at [redacted]w[redacted]d  presenting for occasional contractions. No vaginal bleeding, intact membranes, with decreased  fetal movement.  FM became active once on NST. Pt also very uncomfortable and reporting difficulty sleeping.   Prenatal Course Source of Care: [facility]  with onset of care at 22 weeks Pregnancy complications or risks: Late to care + Gonorrhea with negative TOC H/o depression with h/o SI -no meds currently Anemia  Hx of MJ use - neg UDS in February  Patient Active Problem List   Diagnosis Date Noted  . Labor and delivery, indication for care 01/12/2015   She plans to breastfeed, plans to bottle feed She desires Nexplanon for postpartum contraception.    Prenatal Transfer Tool   Past Medical History  Diagnosis Date  . Anemia   Depression  History reviewed. No pertinent past surgical history.  OB History  Gravida Para Term Preterm AB SAB TAB Ectopic Multiple Living  2 0 0 0 0     1    # Outcome Date GA Lbr Len/2nd Weight Sex Delivery Anes PTL Lv  2 Current           1 Gravida 01/24/12              Social History   Social History  . Marital Status: Single    Spouse Name: N/A  . Number of Children: N/A  . Years of Education: N/A   Social History Main Topics  . Smoking status: Never Smoker   . Smokeless tobacco: Never Used  . Alcohol Use: No     Comment: occ.   . Drug Use: No  . Sexual Activity: Yes   Other Topics Concern  . None   Social History Narrative    History reviewed. No pertinent family history.  No prescriptions prior to admission    No Known Allergies  Review of Systems: Negative except for what is mentioned in HPI.  Physical Exam: BP 123/62 mmHg  Pulse 93  LMP 06/12/2014 (Approximate) GENERAL: Well-developed, well-nourished female in no acute distress.  ABDOMEN: Soft, nontender,  nondistended, gravid. EXTREMITIES: Nontender, no edema Cervical Exam: Dilatation 2.5-3 cm   Effacement 80 %   Station -2   Presentation: cephalic FHT: Category: 1 Baseline rate 130 bpm   Variability moderate  Accelerations present   Decelerations none Contractions: irregular   Pertinent Labs/Studies:   No results found for this or any previous visit (from the past 24 hour(s)).  Assessment : IUP at [redacted]w[redacted]d, false labor   Plan: Discharge Home  Ambien given and Rx given to pt.  IOL scheduled for 2/19 pm - has appt on 2/15 at [facility]

## 2015-03-19 NOTE — OB Triage Note (Signed)
Pt. reports occasional contracting beginning yesterday. Reports slight LOF; denies VB. States she has not felt fetal movement today.

## 2015-03-21 ENCOUNTER — Inpatient Hospital Stay: Payer: Medicaid Other | Admitting: Certified Registered Nurse Anesthetist

## 2015-03-21 ENCOUNTER — Inpatient Hospital Stay: Payer: Medicaid Other

## 2015-03-21 ENCOUNTER — Inpatient Hospital Stay
Admission: AD | Admit: 2015-03-21 | Discharge: 2015-03-23 | DRG: 775 | Disposition: A | Discharge status: Home / Self Care | Payer: Medicaid Other | Source: Other Acute Inpatient Hospital | Attending: Obstetrics and Gynecology | Admitting: Obstetrics and Gynecology

## 2015-03-21 DIAGNOSIS — W19XXXA Unspecified fall, initial encounter: Secondary | ICD-10-CM

## 2015-03-21 DIAGNOSIS — F419 Anxiety disorder, unspecified: Secondary | ICD-10-CM | POA: Diagnosis present

## 2015-03-21 DIAGNOSIS — Z3490 Encounter for supervision of normal pregnancy, unspecified, unspecified trimester: Secondary | ICD-10-CM

## 2015-03-21 DIAGNOSIS — F329 Major depressive disorder, single episode, unspecified: Secondary | ICD-10-CM | POA: Diagnosis present

## 2015-03-21 DIAGNOSIS — O480 Post-term pregnancy: Secondary | ICD-10-CM | POA: Diagnosis present

## 2015-03-21 DIAGNOSIS — W1830XA Fall on same level, unspecified, initial encounter: Secondary | ICD-10-CM | POA: Diagnosis not present

## 2015-03-21 DIAGNOSIS — Y92230 Patient room in hospital as the place of occurrence of the external cause: Secondary | ICD-10-CM | POA: Diagnosis not present

## 2015-03-21 DIAGNOSIS — O0933 Supervision of pregnancy with insufficient antenatal care, third trimester: Secondary | ICD-10-CM | POA: Diagnosis not present

## 2015-03-21 DIAGNOSIS — O99824 Streptococcus B carrier state complicating childbirth: Principal | ICD-10-CM | POA: Diagnosis present

## 2015-03-21 DIAGNOSIS — O99820 Streptococcus B carrier state complicating pregnancy: Secondary | ICD-10-CM | POA: Diagnosis present

## 2015-03-21 DIAGNOSIS — Z3A40 40 weeks gestation of pregnancy: Secondary | ICD-10-CM | POA: Diagnosis not present

## 2015-03-21 DIAGNOSIS — R51 Headache: Secondary | ICD-10-CM | POA: Diagnosis not present

## 2015-03-21 DIAGNOSIS — O99344 Other mental disorders complicating childbirth: Secondary | ICD-10-CM | POA: Diagnosis present

## 2015-03-21 DIAGNOSIS — Z23 Encounter for immunization: Secondary | ICD-10-CM

## 2015-03-21 DIAGNOSIS — R42 Dizziness and giddiness: Secondary | ICD-10-CM | POA: Diagnosis not present

## 2015-03-21 DIAGNOSIS — Y92239 Unspecified place in hospital as the place of occurrence of the external cause: Secondary | ICD-10-CM

## 2015-03-21 HISTORY — DX: Major depressive disorder, single episode, unspecified: F32.9

## 2015-03-21 HISTORY — DX: Personal history of other infectious and parasitic diseases: Z86.19

## 2015-03-21 HISTORY — DX: Depression, unspecified: F32.A

## 2015-03-21 HISTORY — DX: Anxiety disorder, unspecified: F41.9

## 2015-03-21 LAB — CBC
HEMATOCRIT: 25.8 % — AB (ref 35.0–47.0)
Hemoglobin: 8.2 g/dL — ABNORMAL LOW (ref 12.0–16.0)
MCH: 21.3 pg — ABNORMAL LOW (ref 26.0–34.0)
MCHC: 31.7 g/dL — AB (ref 32.0–36.0)
MCV: 67.2 fL — ABNORMAL LOW (ref 80.0–100.0)
PLATELETS: 265 10*3/uL (ref 150–440)
RBC: 3.84 MIL/uL (ref 3.80–5.20)
RDW: 17.1 % — AB (ref 11.5–14.5)
WBC: 10.2 10*3/uL (ref 3.6–11.0)

## 2015-03-21 LAB — URINE DRUG SCREEN, QUALITATIVE (ARMC ONLY)
AMPHETAMINES, UR SCREEN: NOT DETECTED
Barbiturates, Ur Screen: NOT DETECTED
Benzodiazepine, Ur Scrn: NOT DETECTED
COCAINE METABOLITE, UR ~~LOC~~: NOT DETECTED
Cannabinoid 50 Ng, Ur ~~LOC~~: NOT DETECTED
MDMA (ECSTASY) UR SCREEN: NOT DETECTED
Methadone Scn, Ur: NOT DETECTED
Opiate, Ur Screen: NOT DETECTED
PHENCYCLIDINE (PCP) UR S: NOT DETECTED
Tricyclic, Ur Screen: NOT DETECTED

## 2015-03-21 LAB — TYPE AND SCREEN
ABO/RH(D): A POS
ANTIBODY SCREEN: NEGATIVE

## 2015-03-21 LAB — CHLAMYDIA/NGC RT PCR (ARMC ONLY)
Chlamydia Tr: NOT DETECTED
N GONORRHOEAE: NOT DETECTED

## 2015-03-21 MED ORDER — OXYTOCIN 40 UNITS IN LACTATED RINGERS INFUSION - SIMPLE MED
2.5000 [IU]/h | INTRAVENOUS | Status: DC
  Administered 2015-03-21: 2.5 [IU]/h via INTRAVENOUS

## 2015-03-21 MED ORDER — ACETAMINOPHEN 325 MG PO TABS
650.0000 mg | ORAL_TABLET | ORAL | Status: DC | PRN
  Administered 2015-03-21: 650 mg via ORAL
  Filled 2015-03-21: qty 2

## 2015-03-21 MED ORDER — IBUPROFEN 600 MG PO TABS
600.0000 mg | ORAL_TABLET | Freq: Four times a day (QID) | ORAL | Status: DC
  Administered 2015-03-21 – 2015-03-23 (×5): 600 mg via ORAL
  Filled 2015-03-21 (×6): qty 1

## 2015-03-21 MED ORDER — EPHEDRINE 5 MG/ML INJ
10.0000 mg | INTRAVENOUS | Status: DC | PRN
  Filled 2015-03-21: qty 2

## 2015-03-21 MED ORDER — LACTATED RINGERS IV SOLN
INTRAVENOUS | Status: DC
  Administered 2015-03-21 (×2): via INTRAVENOUS

## 2015-03-21 MED ORDER — MISOPROSTOL 200 MCG PO TABS
ORAL_TABLET | ORAL | Status: AC
  Filled 2015-03-21: qty 4

## 2015-03-21 MED ORDER — LIDOCAINE-EPINEPHRINE (PF) 1.5 %-1:200000 IJ SOLN
INTRAMUSCULAR | Status: DC | PRN
  Administered 2015-03-21: 3 mL via EPIDURAL

## 2015-03-21 MED ORDER — PRENATAL MULTIVITAMIN CH
1.0000 | ORAL_TABLET | Freq: Every day | ORAL | Status: DC
  Administered 2015-03-22 – 2015-03-23 (×2): 1 via ORAL
  Filled 2015-03-21 (×2): qty 1

## 2015-03-21 MED ORDER — FENTANYL CITRATE (PF) 100 MCG/2ML IJ SOLN
50.0000 ug | INTRAMUSCULAR | Status: DC | PRN
  Administered 2015-03-21: 50 ug via INTRAVENOUS
  Filled 2015-03-21: qty 2

## 2015-03-21 MED ORDER — PHENYLEPHRINE 40 MCG/ML (10ML) SYRINGE FOR IV PUSH (FOR BLOOD PRESSURE SUPPORT)
80.0000 ug | PREFILLED_SYRINGE | INTRAVENOUS | Status: DC | PRN
  Filled 2015-03-21: qty 2

## 2015-03-21 MED ORDER — FENTANYL 2.5 MCG/ML W/ROPIVACAINE 0.2% IN NS 100 ML EPIDURAL INFUSION (ARMC-ANES)
EPIDURAL | Status: AC
  Filled 2015-03-21: qty 100

## 2015-03-21 MED ORDER — DIPHENHYDRAMINE HCL 25 MG PO CAPS: 25.0000 mg | ORAL_CAPSULE | Freq: Four times a day (QID) | ORAL | Status: DC | PRN

## 2015-03-21 MED ORDER — CITRIC ACID-SODIUM CITRATE 334-500 MG/5ML PO SOLN: 30.0000 mL | ORAL | Status: DC | PRN

## 2015-03-21 MED ORDER — OXYCODONE HCL 5 MG PO TABS
5.0000 mg | ORAL_TABLET | ORAL | Status: DC | PRN
Start: 2015-03-21 — End: 2015-03-23
  Administered 2015-03-21 – 2015-03-22 (×3): 5 mg via ORAL
  Filled 2015-03-21 (×3): qty 1

## 2015-03-21 MED ORDER — SIMETHICONE 80 MG PO CHEW: 80.0000 mg | CHEWABLE_TABLET | ORAL | Status: DC | PRN

## 2015-03-21 MED ORDER — DIBUCAINE 1 % RE OINT: 1.0000 "application " | TOPICAL_OINTMENT | RECTAL | Status: DC | PRN

## 2015-03-21 MED ORDER — LIDOCAINE HCL (PF) 1 % IJ SOLN
INTRAMUSCULAR | Status: DC | PRN
  Administered 2015-03-21 (×2): 3 mL via SUBCUTANEOUS

## 2015-03-21 MED ORDER — TERBUTALINE SULFATE 1 MG/ML IJ SOLN: 0.2500 mg | Freq: Once | INTRAMUSCULAR | Status: DC | PRN

## 2015-03-21 MED ORDER — LACTATED RINGERS IV SOLN: 500.0000 mL | INTRAVENOUS | Status: DC | PRN

## 2015-03-21 MED ORDER — LACTATED RINGERS IV SOLN
500.0000 mL | Freq: Once | INTRAVENOUS | Status: AC
  Administered 2015-03-21: 500 mL via INTRAVENOUS

## 2015-03-21 MED ORDER — OXYTOCIN BOLUS FROM INFUSION
500.0000 mL | INTRAVENOUS | Status: DC
  Administered 2015-03-21: 500 mL via INTRAVENOUS

## 2015-03-21 MED ORDER — ACETAMINOPHEN 325 MG PO TABS: 650.0000 mg | ORAL_TABLET | ORAL | Status: DC | PRN

## 2015-03-21 MED ORDER — OXYTOCIN 40 UNITS IN LACTATED RINGERS INFUSION - SIMPLE MED
1.0000 m[IU]/min | INTRAVENOUS | Status: DC
  Administered 2015-03-21: 1 m[IU]/min via INTRAVENOUS
  Filled 2015-03-21: qty 1000

## 2015-03-21 MED ORDER — OXYTOCIN 10 UNIT/ML IJ SOLN
INTRAMUSCULAR | Status: AC
  Filled 2015-03-21: qty 2

## 2015-03-21 MED ORDER — ONDANSETRON HCL 4 MG PO TABS: 4.0000 mg | ORAL_TABLET | ORAL | Status: DC | PRN

## 2015-03-21 MED ORDER — LIDOCAINE HCL (PF) 1 % IJ SOLN
INTRAMUSCULAR | Status: AC
  Filled 2015-03-21: qty 30

## 2015-03-21 MED ORDER — SENNOSIDES-DOCUSATE SODIUM 8.6-50 MG PO TABS
2.0000 | ORAL_TABLET | ORAL | Status: DC
  Administered 2015-03-22: 2 via ORAL
  Filled 2015-03-21: qty 2

## 2015-03-21 MED ORDER — ONDANSETRON HCL 4 MG/2ML IJ SOLN: 4.0000 mg | Freq: Four times a day (QID) | INTRAMUSCULAR | Status: DC | PRN

## 2015-03-21 MED ORDER — WITCH HAZEL-GLYCERIN EX PADS: 1.0000 "application " | MEDICATED_PAD | CUTANEOUS | Status: DC | PRN

## 2015-03-21 MED ORDER — PENICILLIN G POTASSIUM 5000000 UNITS IJ SOLR
5.0000 10*6.[IU] | Freq: Once | INTRAVENOUS | Status: AC
  Administered 2015-03-21: 5 10*6.[IU] via INTRAVENOUS
  Filled 2015-03-21: qty 5

## 2015-03-21 MED ORDER — PENICILLIN G POTASSIUM 5000000 UNITS IJ SOLR
2.5000 10*6.[IU] | INTRAVENOUS | Status: DC
  Administered 2015-03-21 (×2): 2.5 10*6.[IU] via INTRAVENOUS
  Filled 2015-03-21 (×7): qty 2.5

## 2015-03-21 MED ORDER — DIPHENHYDRAMINE HCL 50 MG/ML IJ SOLN: 12.5000 mg | INTRAMUSCULAR | Status: DC | PRN

## 2015-03-21 MED ORDER — FENTANYL 2.5 MCG/ML W/ROPIVACAINE 0.2% IN NS 100 ML EPIDURAL INFUSION (ARMC-ANES): 9.0000 mL/h | EPIDURAL | Status: DC

## 2015-03-21 MED ORDER — LANOLIN HYDROUS EX OINT: TOPICAL_OINTMENT | CUTANEOUS | Status: DC | PRN

## 2015-03-21 MED ORDER — TETANUS-DIPHTH-ACELL PERTUSSIS 5-2.5-18.5 LF-MCG/0.5 IM SUSP
0.5000 mL | Freq: Once | INTRAMUSCULAR | Status: AC
  Administered 2015-03-22: 0.5 mL via INTRAMUSCULAR
  Filled 2015-03-21: qty 0.5

## 2015-03-21 MED ORDER — BENZOCAINE-MENTHOL 20-0.5 % EX AERO
1.0000 "application " | INHALATION_SPRAY | CUTANEOUS | Status: DC | PRN
  Filled 2015-03-21: qty 56

## 2015-03-21 MED ORDER — OXYCODONE HCL 5 MG PO TABS: 10.0000 mg | ORAL_TABLET | ORAL | Status: DC | PRN

## 2015-03-21 MED ORDER — AMMONIA AROMATIC IN INHA
RESPIRATORY_TRACT | Status: AC
  Filled 2015-03-21: qty 10

## 2015-03-21 MED ORDER — FENTANYL 2.5 MCG/ML W/ROPIVACAINE 0.2% IN NS 100 ML EPIDURAL INFUSION (ARMC-ANES)
EPIDURAL | Status: DC | PRN
  Administered 2015-03-21: 8 mL/h via EPIDURAL

## 2015-03-21 MED ORDER — ONDANSETRON HCL 4 MG/2ML IJ SOLN: 4.0000 mg | INTRAMUSCULAR | Status: DC | PRN

## 2015-03-21 MED ORDER — BUPIVACAINE HCL (PF) 0.25 % IJ SOLN
INTRAMUSCULAR | Status: DC | PRN
  Administered 2015-03-21: 5 mL via EPIDURAL

## 2015-03-21 MED ORDER — LIDOCAINE HCL (PF) 1 % IJ SOLN: 30.0000 mL | INTRAMUSCULAR | Status: DC | PRN

## 2015-03-21 NOTE — Progress Notes (Signed)
When asked patient states she was asleep and woke up and had to use the bathroom and forget she could not feel her legs and fell to the floor and could not remember.

## 2015-03-21 NOTE — Progress Notes (Signed)
  Labor Progress Note   20 y.o. G2P1001 @ [redacted]w[redacted]d , admitted for  Pregnancy, Labor Management.  Subjective:  Patient is comfortable with epidural and feeling some intermittent pressure  Objective:  BP 114/59 mmHg  Pulse 92  Temp(Src) 98.3 F (36.8 C) (Oral)  Ht 5' (1.524 m)  Wt 68.947 kg (152 lb)  BMI 29.69 kg/m2  SpO2 100%  LMP 06/12/2014 (Approximate) Abd: mild Extr: trace to 1+ bilateral pedal edema SVE: CERVIX: 5 cm dilated, 90 effaced, 0 station  EFM: FHR: 120 bpm, variability: moderate,  accelerations:  Present,  decelerations:  Absent Toco: Frequency: Every 2-4 minutes   Assessment & Plan:  G2P1001 @ [redacted]w[redacted]d, admitted for  Pregnancy and Labor/Delivery Management  1. Pain management: epidural. 2. FWB: FHT category I.  3. ID: GBS positive 4. Labor management: pitocin augmentation as needed All discussed with patient, see orders  Ramata Strothman, CNM   This patient and plan were discussed with Dr Jean Rosenthal 03/21/2015

## 2015-03-21 NOTE — Discharge Summary (Signed)
OB Discharge Summary  Patient Name: Jessica Randall DOB: 02/10/1995 MRN: [id number]  Date of admission: 03/21/2015 Delivering MD: Thomasene Mohair, MD Date of Delivery: 03/21/2015 Date of discharge: 03/23/2015  Admitting diagnosis: Pregnant contractions Intrauterine pregnancy: [redacted]w[redacted]d     Secondary diagnosis: Late Prenatal Care, History of Anxiety/Depression     Discharge diagnosis: Term Pregnancy Delivered                                                                                                Post partum procedures:CT scan after ground level fall following delivery  Augmentation: Pitocin  Complications: None  Hospital course:  Onset of Labor With Vaginal Delivery     19 y.o. yo G2P1001 at [redacted]w[redacted]d was admitted in Active Labor on 03/21/2015. Patient had an uncomplicated labor course as follows:  Membrane Rupture Time/Date: 4:32 PM ,03/21/2015   Intrapartum Procedures: Episiotomy: None [1]                                         Lacerations:  None [1]  Patient had a delivery of a Viable infant. 03/21/2015  Information for the patient's newborn:  Adylin, Hankey [[id number]]  Delivery Method: Vag-Spont     Pateint had an uncomplicated postpartum course.  She is ambulating, tolerating a regular diet, passing flatus, and urinating well. Patient is discharged home in stable condition on 03/23/2015.  Patient did have ground level fall postpartum day 0 after attempting to ambulate unassisted and hit her head, negative head CT no sequela on discharge    Physical exam  Filed Vitals:   03/22/15 1156 03/22/15 1609 03/22/15 1946 03/23/15 0721  BP: 124/72 122/74 120/88 128/89  Pulse: 84 71 80 68  Temp: 97.6 F (36.4 C) 98.4 F (36.9 C) 97.9 F (36.6 C) 98.1 F (36.7 C)  TempSrc: Oral Oral Oral Oral  Resp: Height:      Weight:      SpO2: 100% 100% 100% 100%   General: alert, cooperative and no distress Lochia: appropriate Uterine Fundus: firm DVT Evaluation: No  evidence of DVT seen on physical exam.  Labs: Lab Results  Component Value Date   WBC 13.6* 03/22/2015   HGB 8.4* 03/22/2015   HCT 26.6* 03/22/2015   MCV 66.5* 03/22/2015   PLT 239 03/22/2015   CMP Latest Ref Rng 08/23/2014  Glucose 65 - 99 mg/dL 91  BUN 6 - 20 mg/dL 8  Creatinine 7.82 - 9.56 mg/dL 2.13  Sodium 086 - 578 mmol/L 135  Potassium 3.5 - 5.1 mmol/L 3.2(L)  Chloride 101 - 111 mmol/L 106  CO2 22 - 32 mmol/L 22  Calcium 8.9 - 10.3 mg/dL 9.3  Total Protein 6.5 - 8.1 g/dL -  Total Bilirubin 0.3 - 1.2 mg/dL -  Alkaline Phos 38 - 469 U/L -  AST 15 - 41 U/L -  ALT 14 - 54 U/L -    Discharge instruction: per After Visit Summary.  Medications:    Medication List  TAKE these medications        ferrous sulfate 325 (65 FE) MG tablet  Take 1 tablet (325 mg total) by mouth 2 (two) times daily with a meal.     ibuprofen 600 MG tablet  Commonly known as:  ADVIL,MOTRIN  Take 1 tablet (600 mg total) by mouth every 6 (six) hours.     prenatal multivitamin Tabs tablet  Take 1 tablet by mouth daily at 12 noon.        Diet: routine diet  Activity: Advance as tolerated. Pelvic rest for 6 weeks.   Outpatient follow up: No Follow-up on file.  Postpartum contraception: Undecided Rhogam Given postpartum: no Rubella vaccine given postpartum: no Varicella vaccine given postpartum: no TDaP given antepartum or postpartum: TDaP given 09/08/14 Flu vaccine given antepartum or postpartum: no  Newborn Data: Live born female  Birth Weight: 6 lb 13 oz (3090 g) APGAR: 8, 8   Baby Feeding: Bottle and Breast  Disposition:home with mother  Lorrene Reid, MD

## 2015-03-21 NOTE — Progress Notes (Signed)
Patient educated on not trying to get out of bed by herself after getting an epidural. Patient instructed to call out and let someone know when she had to use the bathroom. Patient also stated that she could still not fully feel her legs still. Patient verbally stated she would call out if she needed to use the bathroom. Will continue to monitor.

## 2015-03-21 NOTE — Anesthesia Preprocedure Evaluation (Signed)
Anesthesia Evaluation  Patient identified by MRN, date of birth, ID band  Reviewed: Allergy & Precautions, NPO status , Patient's Chart, lab work & pertinent test results  Airway Mallampati: II       Dental   Pulmonary           Cardiovascular      Neuro/Psych PSYCHIATRIC DISORDERS Anxiety Depression    GI/Hepatic   Endo/Other    Renal/GU      Musculoskeletal   Abdominal   Peds  Hematology   Anesthesia Other Findings   Reproductive/Obstetrics (+) Pregnancy                             Anesthesia Physical Anesthesia Plan  ASA: II  Anesthesia Plan: Epidural   Post-op Pain Management:    Induction:   Airway Management Planned:   Additional Equipment:   Intra-op Plan:   Post-operative Plan:   Informed Consent: I have reviewed the patients History and Physical, chart, labs and discussed the procedure including the risks, benefits and alternatives for the proposed anesthesia with the patient or authorized representative who has indicated his/her understanding and acceptance.   Dental advisory given  Plan Discussed with: CRNA and Anesthesiologist  Anesthesia Plan Comments:         Anesthesia Quick Evaluation

## 2015-03-21 NOTE — Progress Notes (Signed)
Called for RN report of patient with ground level fall after attempting to get out of bed unassisted while still recovering from her epidural.  She states she hit her head.   Per protocol will get CT of head and will commence neuro checks.

## 2015-03-21 NOTE — H&P (Signed)
Obstetrics Admission History & Physical  03/21/2015 - 7:17 AM Primary OBGYN: [facility]  Chief Complaint: regular UCs  History of Present Illness  20 y.o. G2P1001 @ [redacted]w[redacted]d (Dating: LMP, EDC 2/13), with the above CC. Pregnancy complicated by: Late PNC, h/o THC, +gonorrhea w/ negative TOC, GBS pos, h/o anxiety/depression with h/o SI, h/o median episotomy.  [name] states that she's having regular UCs. No decreased FM or s/s or ROM  Review of Systems:  her 12 point review of systems is negative or as noted in the History of Present Illness.  PMHx:  Past Medical History  Diagnosis Date  . Anemia    PSHx: History reviewed. No pertinent past surgical history. Medications:  No prescriptions prior to admission   Allergies: has No Known Allergies. OBHx:  OB History  Gravida Para Term Preterm AB SAB TAB Ectopic Multiple Living  2 0 0 0 0     1    # Outcome Date GA Lbr Len/2nd Weight Sex Delivery Anes PTL Lv  2 Current           1 Gravida 01/24/12             01/2012: 7lbs 3oz, median episotomy. No reason given and laceration stated as "none" (Dr. Tiburcio Pea)  GYNHx:  History of abnormal pap smears: unknown History of STIs: yes FHx: History reviewed. No pertinent family history. Soc Hx:  Social History   Social History  . Marital Status: Single    Spouse Name: N/A  . Number of Children: N/A  . Years of Education: N/A   Occupational History  . Not on file.   Social History Main Topics  . Smoking status: Never Smoker   . Smokeless tobacco: Never Used  . Alcohol Use: No     Comment: occ.   . Drug Use: No  . Sexual Activity: Yes   Other Topics Concern  . Not on file   Social History Narrative    Objective    Current Vital Signs 24h Vital Sign Ranges  T 97.9 F (36.6 C) Temp  Avg: 97.9 F (36.6 C)  Min: 97.9 F (36.6 C)  Max: 97.9 F (36.6 C)  BP 126/74 mmHg BP  Min: 126/74  Max: 126/74  HR 83 Pulse  Avg: 83  Min: 83  Max: 83  RR  16 No Data Recorded  SaO2     98 No Data Recorded       24 Hour I/O Current Shift I/O  Time Ins Outs       EFM:  130 baseline, +accels, +early decels, ?slight variables vs maternal artifact, mod var Toco: q3-42m  General: Well nourished, well developed female in no acute distress.  Skin:  Warm and dry.  Cardiovascular: Regular rate and rhythm. Respiratory:  Clear to auscultation bilateral. Normal respiratory effort Abdomen: gravid, nttp Neuro/Psych:  Normal mood and affect.   SVE: 4/80/-1 @ 0615 per RN, BOW intact, cephalic Leopolds/EFW: 3400gm, cephalic  Labs  pending  Radiology none  Perinatal info  A POS/ Rubella  Immune / Varicella Immune/RPR pending/HIV Negative/HepB Surf Ag Negative/TDaP:yes / Flu shot: unknown/pap unknown/  Assessment & Plan   20 y.o. G2P1001 @ [redacted]w[redacted]d likely transitioning into active labor *IUP: likely category I with accels. Will able to trace better once epidural is placed and pt more comfortable *GBS pos: start PCN now *Labor: augment PRN but only after 2nd dose of abx and as long as fetal status is reassuring. Pt amenable to plan *h/o  THC use: UDS has been negative x 2 in pregnancy. Follow up admit one *h/o GC/CT: follow up admit urine test *Psych: strongly consider 7-10d PP follow up for depression screen. Mood currently good *Analgesia: desires epidural and fine to have after admit labs are back. IV PRN in the interim.   Cornelia Copa MD [facility] Pager [phone]

## 2015-03-21 NOTE — OB Triage Note (Signed)
Pt arrived to LDR 1 from ED with c/o contractions starting at 0500 coming every 5 mins. No bleedy discharge or SROM. Oriented to room and placed on monitors.

## 2015-03-21 NOTE — Anesthesia Procedure Notes (Addendum)
Epidural Patient location during procedure: OB Start time: 03/21/2015 8:42 AM End time: 03/21/2015 9:18 AM  Staffing Anesthesiologist: Yevette Edwards Resident/CRNA: Malva Cogan Performed by: resident/CRNA   Preanesthetic Checklist Completed: patient identified, site marked, surgical consent, pre-op evaluation, timeout performed, IV checked, risks and benefits discussed and monitors and equipment checked  Epidural Patient position: sitting Prep: Betadine Patient monitoring: heart rate, continuous pulse ox and blood pressure Approach: midline Location: L3-L4 Injection technique: LOR saline  Needle:  Needle type: Tuohy  Needle gauge: 18 G Needle length: 9 cm and 9 Needle insertion depth: 6 cm Catheter type: closed end flexible Catheter size: 20 Guage Catheter at skin depth: 11 cm Test dose: negative and 1.5% lidocaine with Epi 1:200 K  Assessment Sensory level: T6 Events: blood not aspirated, injection not painful, no injection resistance, negative IV test and no paresthesia  Additional Notes   Patient tolerated the insertion well without complications.Reason for block:procedure for pain

## 2015-03-22 LAB — CBC
HCT: 26.6 % — ABNORMAL LOW (ref 35.0–47.0)
HEMOGLOBIN: 8.4 g/dL — AB (ref 12.0–16.0)
MCH: 21 pg — ABNORMAL LOW (ref 26.0–34.0)
MCHC: 31.5 g/dL — AB (ref 32.0–36.0)
MCV: 66.5 fL — ABNORMAL LOW (ref 80.0–100.0)
Platelets: 239 10*3/uL (ref 150–440)
RBC: 4 MIL/uL (ref 3.80–5.20)
RDW: 17.6 % — ABNORMAL HIGH (ref 11.5–14.5)
WBC: 13.6 10*3/uL — AB (ref 3.6–11.0)

## 2015-03-22 LAB — RPR: RPR Ser Ql: NONREACTIVE

## 2015-03-22 MED ORDER — HYDROXYZINE HCL 10 MG PO TABS
10.0000 mg | ORAL_TABLET | Freq: Three times a day (TID) | ORAL | Status: DC | PRN
  Administered 2015-03-22: 10 mg via ORAL
  Filled 2015-03-22 (×2): qty 1

## 2015-03-22 MED ORDER — FERROUS SULFATE 325 (65 FE) MG PO TABS
325.0000 mg | ORAL_TABLET | Freq: Two times a day (BID) | ORAL | Status: DC
  Administered 2015-03-22 – 2015-03-23 (×2): 325 mg via ORAL
  Filled 2015-03-22 (×2): qty 1

## 2015-03-22 MED ORDER — HYDROCORTISONE 2.5 % RE CREA
TOPICAL_CREAM | Freq: Four times a day (QID) | RECTAL | Status: AC
  Administered 2015-03-22: 21:00:00 via RECTAL
  Filled 2015-03-22: qty 28.35

## 2015-03-22 NOTE — Plan of Care (Signed)
Problem: Life Cycle: Goal: Chance of risk for complications during the postpartum period will decrease Variance: Unpredicted event Comments: Pt fell after delivery; high falls risk now until discharge.

## 2015-03-22 NOTE — Progress Notes (Signed)
  Postpartum Day 1  Subjective: Pt s/p fall last night. Denies feeling lightheaded when walking or any further pain in head after fall. Some dizziness after motrin this am.   Objective: Blood pressure 124/72, pulse 84, temperature 97.6 F (36.4 C), temperature source Oral, resp. rate 20, height 5' (1.524 m), weight 152 lb (68.947 kg), last menstrual period 06/12/2014, SpO2 100 %  Physical Exam:  General: alert and cooperative Lochia: appropriate Uterine Fundus: firm Incision: N/A DVT Evaluation: No evidence of DVT seen on physical exam. Abdomen: soft, NT   Recent Labs  03/21/15 0739 03/22/15 0644  HGB 8.2* 8.4*  HCT 25.8* 26.6*   CT scan of head negative  Assessment PPD #1, S/p fall (hit head) with neg CT and no further complications, iron deficiency anemia  Plan: Continue PP care and Fe replacement, anemia precautions  Fall precautions  Feeding: breast & bottle Contraception: nexplanon Blood Type: A+ RI/VI TDAP UTD  H/o depression - suggest close f/u pp for this    Marta Antu, [place] 03/22/2015, 12:14 PM

## 2015-03-23 MED ORDER — FERROUS SULFATE 325 (65 FE) MG PO TABS: 325.0000 mg | ORAL_TABLET | Freq: Two times a day (BID) | ORAL | Status: DC

## 2015-03-23 MED ORDER — PRENATAL MULTIVITAMIN CH: 1.0000 | ORAL_TABLET | Freq: Every day | ORAL | Status: DC

## 2015-03-23 MED ORDER — IBUPROFEN 600 MG PO TABS: 600.0000 mg | ORAL_TABLET | Freq: Four times a day (QID) | ORAL | Status: DC

## 2015-03-23 NOTE — Clinical Social Work Maternal (Signed)
  CLINICAL SOCIAL WORK MATERNAL/CHILD NOTE  Patient Details  Name: Jessica Randall MRN: [id number] Date of Birth: 04-26-1995  Date:  03/23/2015  Clinical Social Worker Initiating Note:   Jessica Randall, [place] ) Date/ Time Initiated:  03/23/15/1421     Child's Name:      Legal Guardian:  Mother   Need for Interpreter:  None   Date of Referral:  03/23/15     Reason for Referral:  Other (Comment) (Transportation Concerns)   Referral Source:  RN   Address:   ([address] [place], [place] )  Phone number:      Household Members:  Minor Children, Parents   Natural Supports (not living in the home):  Community, Extended Family, Friends   Professional Supports: Case Metallurgist   Employment: Part-time   Type of Work:  (Works at [employer]. )   Education:  Chiropractor Resources:  Medicaid   Other Resources:  [facility]   Cultural/Religious Considerations Which May Impact Care:  none   Strengths:  Home prepared for child    Risk Factors/Current Problems:  Estate manager/land agent:  Goal Oriented    Mood/Affect:  Calm , Comfortable    CSW Assessment: Holiday representative (CSW) received call from RN that patient needs transportation home for herself, infant, and 71 y.o son. CSW met with patient to address consult. Patient was pleasant and open throughout assessment. Patient reported that she [name] with her mother and her 47 y.o son. Patient reported that her mother's car is broken down and her grandmother that lives in [place], [place] usually provides transportation. Patient reported that her grandmother is sick today and cannot come pick her up from the hospital. Patient reported that she has [facility] and works at [employer]. Patient reported that the father of the baby lives in [place] and is not involved. Patient has a car seat for infant. Per patient she also has Medicaid transportation. Medicaid transportation could not be  arranged today because it has to be 2 to 3 days in advance. CSW provided patient with a taxi voucher home. [employer] taxi company will provide a booster seat for the 20 year old. RN is aware of above. Patient reported that she has no other needs at this time. CSW provided patient with list of [facility]. Please reconsult if future social work needs arise. CSW signing off.   CSW Plan/Description:  Information/Referral to [employer] , No Further Intervention Required/No Barriers to Discharge    Jessica Randall 03/23/2015, 2:24 PM

## 2015-03-23 NOTE — Progress Notes (Signed)
Discharge instructions provided by Ilsa Iha, RN.  Pt discharged to home with infant via wheelchair by volunteer at 1305 on 03/23/15 via wheelchair by volunteer. 03/23/2015 1:11 PM Reynold Bowen, RN

## 2015-03-28 ENCOUNTER — Emergency Department (HOSPITAL_COMMUNITY): Payer: Medicaid Other

## 2015-03-28 ENCOUNTER — Observation Stay (HOSPITAL_COMMUNITY): Payer: Medicaid Other

## 2015-03-28 ENCOUNTER — Encounter (HOSPITAL_COMMUNITY): Payer: Self-pay

## 2015-03-28 ENCOUNTER — Observation Stay (HOSPITAL_COMMUNITY)
Admission: EM | Admit: 2015-03-28 | Discharge: 2015-03-29 | Disposition: A | Discharge status: Home / Self Care | Payer: Medicaid Other | Attending: Family Medicine | Admitting: Family Medicine

## 2015-03-28 DIAGNOSIS — R4781 Slurred speech: Secondary | ICD-10-CM | POA: Diagnosis not present

## 2015-03-28 DIAGNOSIS — G459 Transient cerebral ischemic attack, unspecified: Secondary | ICD-10-CM

## 2015-03-28 DIAGNOSIS — E785 Hyperlipidemia, unspecified: Secondary | ICD-10-CM | POA: Diagnosis not present

## 2015-03-28 DIAGNOSIS — O9903 Anemia complicating the puerperium: Secondary | ICD-10-CM | POA: Diagnosis not present

## 2015-03-28 DIAGNOSIS — R2990 Unspecified symptoms and signs involving the nervous system: Secondary | ICD-10-CM

## 2015-03-28 DIAGNOSIS — Z79899 Other long term (current) drug therapy: Secondary | ICD-10-CM | POA: Insufficient documentation

## 2015-03-28 DIAGNOSIS — R531 Weakness: Secondary | ICD-10-CM | POA: Insufficient documentation

## 2015-03-28 DIAGNOSIS — R29818 Other symptoms and signs involving the nervous system: Secondary | ICD-10-CM | POA: Diagnosis not present

## 2015-03-28 DIAGNOSIS — I10 Essential (primary) hypertension: Secondary | ICD-10-CM | POA: Diagnosis present

## 2015-03-28 DIAGNOSIS — O9089 Other complications of the puerperium, not elsewhere classified: Secondary | ICD-10-CM | POA: Diagnosis not present

## 2015-03-28 DIAGNOSIS — I159 Secondary hypertension, unspecified: Secondary | ICD-10-CM | POA: Diagnosis not present

## 2015-03-28 DIAGNOSIS — O99355 Diseases of the nervous system complicating the puerperium: Principal | ICD-10-CM | POA: Insufficient documentation

## 2015-03-28 DIAGNOSIS — I639 Cerebral infarction, unspecified: Secondary | ICD-10-CM

## 2015-03-28 DIAGNOSIS — Z791 Long term (current) use of non-steroidal anti-inflammatories (NSAID): Secondary | ICD-10-CM | POA: Insufficient documentation

## 2015-03-28 DIAGNOSIS — D649 Anemia, unspecified: Secondary | ICD-10-CM | POA: Diagnosis not present

## 2015-03-28 DIAGNOSIS — O165 Unspecified maternal hypertension, complicating the puerperium: Secondary | ICD-10-CM | POA: Diagnosis not present

## 2015-03-28 DIAGNOSIS — E876 Hypokalemia: Secondary | ICD-10-CM | POA: Diagnosis present

## 2015-03-28 HISTORY — DX: Transient cerebral ischemic attack, unspecified: G45.9

## 2015-03-28 HISTORY — DX: Unspecified symptoms and signs involving the nervous system: R29.90

## 2015-03-28 LAB — CBC
HEMATOCRIT: 34 % — AB (ref 36.0–46.0)
HEMOGLOBIN: 10.5 g/dL — AB (ref 12.0–15.0)
MCH: 22.1 pg — ABNORMAL LOW (ref 26.0–34.0)
MCHC: 30.9 g/dL (ref 30.0–36.0)
MCV: 71.4 fL — ABNORMAL LOW (ref 78.0–100.0)
Platelets: 346 10*3/uL (ref 150–400)
RBC: 4.76 MIL/uL (ref 3.87–5.11)
RDW: 17.7 % — ABNORMAL HIGH (ref 11.5–15.5)
WBC: 8.4 10*3/uL (ref 4.0–10.5)

## 2015-03-28 LAB — HEPATIC FUNCTION PANEL
ALBUMIN: 2.7 g/dL — AB (ref 3.5–5.0)
ALK PHOS: 115 U/L (ref 38–126)
ALT: 17 U/L (ref 14–54)
AST: 19 U/L (ref 15–41)
BILIRUBIN TOTAL: 0.6 mg/dL (ref 0.3–1.2)
Total Protein: 6.3 g/dL — ABNORMAL LOW (ref 6.5–8.1)

## 2015-03-28 LAB — DIFFERENTIAL
Basophils Absolute: 0 K/uL (ref 0.0–0.1)
Basophils Relative: 0 %
Eosinophils Absolute: 0.2 K/uL (ref 0.0–0.7)
Eosinophils Relative: 2 %
Lymphocytes Relative: 33 %
Lymphs Abs: 2.8 K/uL (ref 0.7–4.0)
Monocytes Absolute: 0.5 K/uL (ref 0.1–1.0)
Monocytes Relative: 6 %
Neutro Abs: 4.9 K/uL (ref 1.7–7.7)
Neutrophils Relative %: 59 %

## 2015-03-28 LAB — URINE MICROSCOPIC-ADD ON

## 2015-03-28 LAB — ANTITHROMBIN III: AntiThromb III Func: 115 % (ref 75–120)

## 2015-03-28 LAB — I-STAT CHEM 8, ED
BUN: 4 mg/dL — AB (ref 6–20)
CREATININE: 0.5 mg/dL (ref 0.44–1.00)
Calcium, Ion: 1.13 mmol/L (ref 1.12–1.23)
Chloride: 108 mmol/L (ref 101–111)
GLUCOSE: 84 mg/dL (ref 65–99)
HCT: 34 % — ABNORMAL LOW (ref 36.0–46.0)
HEMOGLOBIN: 11.6 g/dL — AB (ref 12.0–15.0)
POTASSIUM: 3.4 mmol/L — AB (ref 3.5–5.1)
Sodium: 143 mmol/L (ref 135–145)
TCO2: 22 mmol/L (ref 0–100)

## 2015-03-28 LAB — COMPREHENSIVE METABOLIC PANEL
ALBUMIN: 3 g/dL — AB (ref 3.5–5.0)
ALK PHOS: 126 U/L (ref 38–126)
ALT: 22 U/L (ref 14–54)
AST: 22 U/L (ref 15–41)
Anion gap: 14 (ref 5–15)
BILIRUBIN TOTAL: 0.6 mg/dL (ref 0.3–1.2)
CALCIUM: 9.3 mg/dL (ref 8.9–10.3)
CO2: 19 mmol/L — AB (ref 22–32)
Chloride: 109 mmol/L (ref 101–111)
Creatinine, Ser: 0.59 mg/dL (ref 0.44–1.00)
GFR calc Af Amer: >60 mL/min (ref >60)
GFR calc non Af Amer: >60 mL/min (ref >60)
GLUCOSE: 86 mg/dL (ref 65–99)
Potassium: 3.5 mmol/L (ref 3.5–5.1)
SODIUM: 142 mmol/L (ref 135–145)
TOTAL PROTEIN: 6.6 g/dL (ref 6.5–8.1)

## 2015-03-28 LAB — I-STAT TROPONIN, ED: Troponin i, poc: 0 ng/mL (ref 0.00–0.08)

## 2015-03-28 LAB — RAPID URINE DRUG SCREEN, HOSP PERFORMED
Amphetamines: NOT DETECTED
Barbiturates: NOT DETECTED
Benzodiazepines: NOT DETECTED
Cocaine: NOT DETECTED
Opiates: NOT DETECTED
Tetrahydrocannabinol: NOT DETECTED

## 2015-03-28 LAB — ETHANOL: Alcohol, Ethyl (B): <5 mg/dL (ref <5)

## 2015-03-28 LAB — URINALYSIS, ROUTINE W REFLEX MICROSCOPIC
BILIRUBIN URINE: NEGATIVE
GLUCOSE, UA: NEGATIVE mg/dL
KETONES UR: NEGATIVE mg/dL
NITRITE: NEGATIVE
PROTEIN: NEGATIVE mg/dL
Specific Gravity, Urine: 1.006 (ref 1.005–1.030)
pH: 8 (ref 5.0–8.0)

## 2015-03-28 LAB — PROTIME-INR
INR: 1.08 (ref 0.00–1.49)
Prothrombin Time: 14.2 seconds (ref 11.6–15.2)

## 2015-03-28 LAB — LIPID PANEL
CHOL/HDL RATIO: 5.3 ratio
Cholesterol: 234 mg/dL — ABNORMAL HIGH (ref 0–200)
HDL: 44 mg/dL (ref >40)
LDL CALC: 168 mg/dL — AB (ref 0–99)
Triglycerides: 108 mg/dL (ref <150)
VLDL: 22 mg/dL (ref 0–40)

## 2015-03-28 LAB — APTT: aPTT: 31 s (ref 24–37)

## 2015-03-28 LAB — CBG MONITORING, ED: Glucose-Capillary: 85 mg/dL (ref 65–99)

## 2015-03-28 MED ORDER — HYDRALAZINE HCL 20 MG/ML IJ SOLN: 5.0000 mg | INTRAMUSCULAR | Status: DC | PRN

## 2015-03-28 MED ORDER — ASPIRIN 325 MG PO TABS
325.0000 mg | ORAL_TABLET | Freq: Every day | ORAL | Status: DC
  Administered 2015-03-28 – 2015-03-29 (×2): 325 mg via ORAL
  Filled 2015-03-28 (×2): qty 1

## 2015-03-28 MED ORDER — SENNOSIDES-DOCUSATE SODIUM 8.6-50 MG PO TABS: 1.0000 | ORAL_TABLET | Freq: Every evening | ORAL | Status: DC | PRN

## 2015-03-28 MED ORDER — FERROUS SULFATE 325 (65 FE) MG PO TABS
325.0000 mg | ORAL_TABLET | Freq: Two times a day (BID) | ORAL | Status: DC
  Administered 2015-03-28 – 2015-03-29 (×4): 325 mg via ORAL
  Filled 2015-03-28 (×4): qty 1

## 2015-03-28 MED ORDER — ENOXAPARIN SODIUM 40 MG/0.4ML ~~LOC~~ SOLN
40.0000 mg | SUBCUTANEOUS | Status: DC
  Administered 2015-03-28 – 2015-03-29 (×2): 40 mg via SUBCUTANEOUS
  Filled 2015-03-28 (×2): qty 0.4

## 2015-03-28 MED ORDER — SODIUM CHLORIDE 0.9 % IV SOLN
INTRAVENOUS | Status: AC
  Administered 2015-03-28: 08:00:00 via INTRAVENOUS

## 2015-03-28 MED ORDER — DIPHENHYDRAMINE HCL 50 MG/ML IJ SOLN
25.0000 mg | Freq: Once | INTRAMUSCULAR | Status: AC
  Administered 2015-03-28: 25 mg via INTRAVENOUS
  Filled 2015-03-28: qty 1

## 2015-03-28 MED ORDER — STROKE: EARLY STAGES OF RECOVERY BOOK
Freq: Once | Status: AC
  Administered 2015-03-28: 16:00:00
  Filled 2015-03-28: qty 1

## 2015-03-28 NOTE — ED Notes (Signed)
Ordered lunch tray 

## 2015-03-28 NOTE — Progress Notes (Signed)
   03/28/15 0400  Clinical Encounter Type  Visited With Family;Health care provider  Visit Type Initial;Psychological support;Spiritual support;Social support  Referral From Care management  Consult/Referral To Chaplain;Physician  Spiritual Encounters  Spiritual Needs Emotional  Stress Factors  Patient Stress Factors Not reviewed  Family Stress Factors Exhausted   Chaplain was page to respond to a code stroke. Chaplain greeted family while Pt was in CT.[place] communicated with the nurse relating to which family room the Pt's loved one were in;  Chaplain provided a silent and a supportive presence and provided acts of hospitality on behalf of [name].

## 2015-03-28 NOTE — ED Notes (Signed)
Ordered diet tray 

## 2015-03-28 NOTE — Progress Notes (Signed)
Patient admitted from ER. Patient alert and oriented x 4. Patient made comfortable. Tele placed on patient and was verified.

## 2015-03-28 NOTE — Code Documentation (Addendum)
Jessica Randall is a 19yo bf presenting to the [facility] via [facility] after developing Rt side weakness & facial droop.  She is 6 days postpartum and had a normal vaginal delivery after having pre-eclampsia.  She is breastfeeding. She states she developed Rt leg pain during delivery and the pain has not subsided.  She was with her children and boyfriend when the facial droop and numbness began.  She also has c/o of Rt eye twitching for the last 2 days though no visual deficits.  NIH 3 for Rt side weakness and sensory deficit.  Plan MRI.

## 2015-03-28 NOTE — ED Notes (Signed)
Family at bedside. 

## 2015-03-28 NOTE — H&P (Signed)
[facility] History and Physical  Jessica Randall ZOX:[id number] DOB: 11/16/1995 DOA: 03/28/2015  Referring physician:  PCP: No PCP Per Patient   Chief Complaint: right sided weakness  HPI: Jessica Randall is a 20 y.o. female pmhx anxiety, anemia, 6 days post partum presents with right sided weakness and difficulty speaking.  Reportedly symptoms improved rapidly. Being admitted for stroke work up.   Information obtained from patient and record. Patient reports sudden "funny sensation" started in her right eye and right upper lip. She then developed right arm and right leg weakness and difficulty speaking. She reports her right leg felt "heavy" she was unable to walk. Associated symptoms include headache shortness of breath. She denies chest pain palpitations syncope or near-syncope. She denies abdominal pain nausea vomiting dysuria hematuria frequency or urgency. He denies numbness or tingling of her extremities.  In ED code stroke called and patient evaluated by neurology who recommend admission to rule out TIA or possible left MCA territory infarction.  In the ED max temp 99.1 and BP elevated to 140/90. She is not hypoxic. CT of her head showed no acute intracranial abnormality  Review of Systems:  10 point review of systems complete and negative except as noted in HPI  Past Medical History  Diagnosis Date  . Anemia   . Anxiety and depression     history of SI  . History of gonorrhea    History reviewed. No pertinent past surgical history. Social History:  reports that she has never smoked. She has never used smokeless tobacco. She reports that she uses illicit drugs. She reports that she does not drink alcohol. Lives at home. 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 Independent with ADL's No Known Allergies  History reviewed. No pertinent family history. her mother has high blood pressure and lupus. Never a stroke or heart attack. Father deceased of unknown causes. sHe has several  siblings who medical history is positive for diabetes high blood pressure. No stroke MI or cancer  Prior to Admission medications   Medication Sig Start Date End Date Taking? Authorizing Provider  ferrous sulfate 325 (65 FE) MG tablet Take 1 tablet (325 mg total) by mouth 2 (two) times daily with a meal. 03/23/15   Vena Austria, MD  ibuprofen (ADVIL,MOTRIN) 600 MG tablet Take 1 tablet (600 mg total) by mouth every 6 (six) hours. 03/23/15   Vena Austria, MD  Prenatal Vit-Fe Fumarate-FA (PRENATAL MULTIVITAMIN) TABS tablet Take 1 tablet by mouth daily at 12 noon. 03/23/15   Vena Austria, MD   Physical Exam: Filed Vitals:   03/28/15 0500 03/28/15 0530 03/28/15 0600 03/28/15 0700  BP: 140/99 144/89 131/96 135/100  Pulse: 72 71 73 76  Temp:      Resp: 17 24 21    SpO2: 100% 100% 99% 96%    Wt Readings from Last 3 Encounters:  03/21/15 68.947 kg (152 lb)  09/17/14 69.854 kg (154 lb)  08/23/14 75.751 kg (167 lb)    General:  Appears calm and comfortable Eyes: PERRL, normal lids, irises & conjunctiva ENT: grossly normal hearing, lips & tongue, his membranes of her mouth are moist and pink Neck: no LAD, masses or thyromegaly Cardiovascular: RRR, no m/r/g. No LE edema. Pedal pulses present and palpable  Respiratory: CTA bilaterally, no w/r/r. Normal respiratory effort. Abdomen: soft, ntnd positive bowel sounds throughout no guarding or rebounding Skin: no rash or induration seen on limited exam Musculoskeletal: grossly normal tone BUE/BLE, joints without swelling/erythema. Full range of motion Psychiatric: grossly normal mood  and affect, speech fluent and appropriate Neurologic: grossly non-focal. Speech quite soft but clear. Bilateral grips 5 out of 5 lower extremity strength 5 out of 5 on left 4 out of 5 on right no pronator drift tongue midline sensation intact           Labs on Admission:  Basic Metabolic Panel:  Recent Labs Lab 03/28/15 0135 03/28/15 0136  NA 143 142   K 3.4* 3.5  CL 108 109  CO2  --  19*  GLUCOSE 84 86  BUN 4* <5*  CREATININE 0.50 0.59  CALCIUM  --  9.3   Liver Function Tests:  Recent Labs Lab 03/28/15 0136  AST 22  ALT 22  ALKPHOS 126  BILITOT 0.6  PROT 6.6  ALBUMIN 3.0*   No results for input(s): LIPASE, AMYLASE in the last 168 hours. No results for input(s): AMMONIA in the last 168 hours. CBC:  Recent Labs Lab 03/22/15 0644 03/28/15 0135 03/28/15 0136  WBC 13.6*  --  8.4  NEUTROABS  --   --  4.9  HGB 8.4* 11.6* 10.5*  HCT 26.6* 34.0* 34.0*  MCV 66.5*  --  71.4*  PLT 239  --  346   Cardiac Enzymes: No results for input(s): CKTOTAL, CKMB, CKMBINDEX, TROPONINI in the last 168 hours.  BNP (last 3 results) No results for input(s): BNP in the last 8760 hours.  ProBNP (last 3 results) No results for input(s): PROBNP in the last 8760 hours.  CBG:  Recent Labs Lab 03/28/15 0333  GLUCAP 85    Radiological Exams on Admission: Dg Chest 2 View  03/28/2015  CLINICAL DATA:  20 year old female with stroke-like symptoms, abnormal speech and right side weakness. Initial encounter. EXAM: CHEST  2 VIEW COMPARISON:  Head CT without contrast 0133 hours today. FINDINGS: Seated upright AP and lateral views of the chest. Somewhat low lung volumes. Mediastinal contours within normal limits. Visualized tracheal air column is within normal limits. No pneumothorax, pulmonary edema, pleural effusion or confluent pulmonary opacity. No osseous abnormality identified. IMPRESSION: Low lung volumes, otherwise no acute cardiopulmonary abnormality. Electronically Signed   By: Odessa Fleming M.D.   On: 03/28/2015 07:29   Ct Head Wo Contrast  03/28/2015  CLINICAL DATA:  Code stroke. Slurred speech with right-sided weakness and facial droop. Last seen normal 1-1/2 hours prior. EXAM: CT HEAD WITHOUT CONTRAST TECHNIQUE: Contiguous axial images were obtained from the base of the skull through the vertex without intravenous contrast. COMPARISON:  Head  CT 1 week prior 03/21/2015 FINDINGS: No intracranial hemorrhage, mass effect, or midline shift. No hydrocephalus. The basilar cisterns are patent. No evidence of territorial infarct. No intracranial fluid collection. Calvarium is intact. Included paranasal sinuses and mastoid air cells are well aerated. IMPRESSION: No acute intracranial abnormality. These results were called by telephone at the time of interpretation on 03/28/2015 at 1:49 am to Dr. Roseanne Reno , who verbally acknowledged these results. Electronically Signed   By: Rubye Oaks M.D.   On: 03/28/2015 01:50   Mr Shirlee Latch Wo Contrast  03/28/2015  CLINICAL DATA:  Initial evaluation for acute speech difficulty, right-sided weakness. EXAM: MRI HEAD WITHOUT CONTRAST MRA HEAD WITHOUT CONTRAST MRV HEAD WITHOUT CONTRAST TECHNIQUE: Multiplanar, multiecho pulse sequences of the brain and surrounding structures were obtained without intravenous contrast. Angiographic images of the Circle of Willis were obtained using MRA technique without intravenous contrast. Angiographic images of the intracranial venous structures were obtained using MRV technique without intravenous contrast. COMPARISON:  Prior CT from  earlier the same day. FINDINGS: MRI FINDINGS: Cerebral volume within normal limits for patient age. No focal parenchymal signal abnormality. No mass lesion, midline shift, or mass effect. No hydrocephalus. No extra-axial fluid collection. No abnormal foci of restricted diffusion to suggest acute intracranial infarct. Major intracranial vascular flow voids are maintained. Gray-white matter differentiation preserved. No acute or chronic intracranial hemorrhage. No areas of chronic infarction. Craniocervical junction within normal limits. Visualized upper cervical spine unremarkable. Mild prominence of the pituitary gland present, likely within normal limits for patient age and postpartum status. No acute abnormality about the orbits. Paranasal sinuses are clear.  No mastoid effusion. Inner ear structures within normal limits. Bone marrow signal intensity within normal limits. No scalp soft tissue abnormality. MRA FINDINGS: ANTERIOR CIRCULATION: Visualized distal cervical segments of the internal carotid arteries are patent with antegrade flow. Petrous, cavernous, and supraclinoid segments are widely patent. A1 segments, anterior communicating artery, and anterior cerebral arteries well opacified and normal in appearance. M1 segments patent without stenosis or occlusion. MCA bifurcations normal. Distal MCA branches symmetric and well opacified. POSTERIOR CIRCULATION: Vertebral arteries patent to the vertebrobasilar junction. Partially visualized posterior inferior cerebral arteries patent. Basilar artery widely patent to its distal aspect. Superior cerebral arteries well opacified. Both posterior cerebral arteries arise from the basilar artery and are well opacified to their distal aspects. Small bilateral posterior communicating arteries noted. No aneurysm or vascular malformation. MRV FINDINGS: Dedicated MRV images demonstrate normal appearance of the major dural sinuses. Specifically, the superior sagittal, transverse, and sigmoid sinuses are widely patent. Proximal internal jugular veins widely patent. Straight sinus and vein of Galen patent. No filling defect to suggest venous sinus thrombosis. The major cortical veins appear patent. IMPRESSION: MRI HEAD IMPRESSION: Normal MRI. No acute intracranial infarct or other abnormality identified. MRA HEAD IMPRESSION: Normal intracranial MRA. MRV HEAD IMPRESSION: Normal intracranial MRV.  No evidence for venous sinus thrombosis. Electronically Signed   By: Rise Mu M.D.   On: 03/28/2015 04:01   Mr Brain Wo Contrast  03/28/2015  CLINICAL DATA:  Initial evaluation for acute speech difficulty, right-sided weakness. EXAM: MRI HEAD WITHOUT CONTRAST MRA HEAD WITHOUT CONTRAST MRV HEAD WITHOUT CONTRAST TECHNIQUE:  Multiplanar, multiecho pulse sequences of the brain and surrounding structures were obtained without intravenous contrast. Angiographic images of the Circle of Willis were obtained using MRA technique without intravenous contrast. Angiographic images of the intracranial venous structures were obtained using MRV technique without intravenous contrast. COMPARISON:  Prior CT from earlier the same day. FINDINGS: MRI FINDINGS: Cerebral volume within normal limits for patient age. No focal parenchymal signal abnormality. No mass lesion, midline shift, or mass effect. No hydrocephalus. No extra-axial fluid collection. No abnormal foci of restricted diffusion to suggest acute intracranial infarct. Major intracranial vascular flow voids are maintained. Gray-white matter differentiation preserved. No acute or chronic intracranial hemorrhage. No areas of chronic infarction. Craniocervical junction within normal limits. Visualized upper cervical spine unremarkable. Mild prominence of the pituitary gland present, likely within normal limits for patient age and postpartum status. No acute abnormality about the orbits. Paranasal sinuses are clear. No mastoid effusion. Inner ear structures within normal limits. Bone marrow signal intensity within normal limits. No scalp soft tissue abnormality. MRA FINDINGS: ANTERIOR CIRCULATION: Visualized distal cervical segments of the internal carotid arteries are patent with antegrade flow. Petrous, cavernous, and supraclinoid segments are widely patent. A1 segments, anterior communicating artery, and anterior cerebral arteries well opacified and normal in appearance. M1 segments patent without stenosis or occlusion. MCA bifurcations normal.  Distal MCA branches symmetric and well opacified. POSTERIOR CIRCULATION: Vertebral arteries patent to the vertebrobasilar junction. Partially visualized posterior inferior cerebral arteries patent. Basilar artery widely patent to its distal aspect.  Superior cerebral arteries well opacified. Both posterior cerebral arteries arise from the basilar artery and are well opacified to their distal aspects. Small bilateral posterior communicating arteries noted. No aneurysm or vascular malformation. MRV FINDINGS: Dedicated MRV images demonstrate normal appearance of the major dural sinuses. Specifically, the superior sagittal, transverse, and sigmoid sinuses are widely patent. Proximal internal jugular veins widely patent. Straight sinus and vein of Galen patent. No filling defect to suggest venous sinus thrombosis. The major cortical veins appear patent. IMPRESSION: MRI HEAD IMPRESSION: Normal MRI. No acute intracranial infarct or other abnormality identified. MRA HEAD IMPRESSION: Normal intracranial MRA. MRV HEAD IMPRESSION: Normal intracranial MRV.  No evidence for venous sinus thrombosis. Electronically Signed   By: Rise Mu M.D.   On: 03/28/2015 04:01   Mr [place] Lodge  03/28/2015  CLINICAL DATA:  Initial evaluation for acute speech difficulty, right-sided weakness. EXAM: MRI HEAD WITHOUT CONTRAST MRA HEAD WITHOUT CONTRAST MRV HEAD WITHOUT CONTRAST TECHNIQUE: Multiplanar, multiecho pulse sequences of the brain and surrounding structures were obtained without intravenous contrast. Angiographic images of the Circle of Willis were obtained using MRA technique without intravenous contrast. Angiographic images of the intracranial venous structures were obtained using MRV technique without intravenous contrast. COMPARISON:  Prior CT from earlier the same day. FINDINGS: MRI FINDINGS: Cerebral volume within normal limits for patient age. No focal parenchymal signal abnormality. No mass lesion, midline shift, or mass effect. No hydrocephalus. No extra-axial fluid collection. No abnormal foci of restricted diffusion to suggest acute intracranial infarct. Major intracranial vascular flow voids are maintained. Gray-white matter differentiation preserved. No  acute or chronic intracranial hemorrhage. No areas of chronic infarction. Craniocervical junction within normal limits. Visualized upper cervical spine unremarkable. Mild prominence of the pituitary gland present, likely within normal limits for patient age and postpartum status. No acute abnormality about the orbits. Paranasal sinuses are clear. No mastoid effusion. Inner ear structures within normal limits. Bone marrow signal intensity within normal limits. No scalp soft tissue abnormality. MRA FINDINGS: ANTERIOR CIRCULATION: Visualized distal cervical segments of the internal carotid arteries are patent with antegrade flow. Petrous, cavernous, and supraclinoid segments are widely patent. A1 segments, anterior communicating artery, and anterior cerebral arteries well opacified and normal in appearance. M1 segments patent without stenosis or occlusion. MCA bifurcations normal. Distal MCA branches symmetric and well opacified. POSTERIOR CIRCULATION: Vertebral arteries patent to the vertebrobasilar junction. Partially visualized posterior inferior cerebral arteries patent. Basilar artery widely patent to its distal aspect. Superior cerebral arteries well opacified. Both posterior cerebral arteries arise from the basilar artery and are well opacified to their distal aspects. Small bilateral posterior communicating arteries noted. No aneurysm or vascular malformation. MRV FINDINGS: Dedicated MRV images demonstrate normal appearance of the major dural sinuses. Specifically, the superior sagittal, transverse, and sigmoid sinuses are widely patent. Proximal internal jugular veins widely patent. Straight sinus and vein of Galen patent. No filling defect to suggest venous sinus thrombosis. The major cortical veins appear patent. IMPRESSION: MRI HEAD IMPRESSION: Normal MRI. No acute intracranial infarct or other abnormality identified. MRA HEAD IMPRESSION: Normal intracranial MRA. MRV HEAD IMPRESSION: Normal intracranial  MRV.  No evidence for venous sinus thrombosis. Electronically Signed   By: Rise Mu M.D.   On: 03/28/2015 04:01    EKG: Independently reviewed. Sinus rhythm Low voltage, extremity leads  Assessment/Plan  Principal Problem:   Stroke-like symptoms Active Problems:   Hypertension   Hypokalemia   Anemia  #1. Strokelike symptoms/right-sided weakness/slurred speech.  patient with history of preeclampsia 6 days postpartum. CT without acute abnormality. EKG within limits of normal. Chest xray unremarkable.  deficits improved in the ED. NIH stroke score was 3. MRI MRA angiogram and MR venogram within the limits of normal -Admit to telemetry -Obtain echocardiogram and carotid Dopplers -Hg A1c lipid panel -Physical therapy occupational therapy speech consult -Heart healthy diet once bedside swallow eval past -hypercoag panel -Aspirin -hold statin for now as she is breast feeding.   #2. Hypertension. Pressure in the emergency department 135/100. Recent history of preeclampsia. Some concern for post partum pre-eclamsia. No protein in urine. No edema.  -monitor closely -prn hydralazine -obtain LFT -if no improvement consider mag -recheck in am  3. Appropriately anemia. Mild. Will replete and recheck  #4. Anemia. History of same. 6 days post partum -continue supplement -OP follow up   Code Status: full DVT Prophylaxis: Family Communication: significant other at bedside Disposition Plan: home when ready  Time spent: 60 minutes  [facility] M [facility]

## 2015-03-28 NOTE — Consult Note (Addendum)
Admission H&P    Chief Complaint: New onset right side weakness and speech difficulty.  HPI: Jessica Randall is an 20 y.o. female with a history of anxiety and depression as well as anemia, brought to the emergency room and code stroke status following acute onset of right side weakness and speech difficulty at 12:30 AM today. Patient is 6 days postpartum, uncomplicated vaginal delivery. Patient had preeclampsia. CT scan of her head showed no acute intracranial abnormality. Deficits improved rapidly following arrival in the ED. NIH stroke score was 3. MRI of the brain, MR angiogram and MR venogram had been ordered and are pending.  LSN: 12:30 AM on 03/28/2015 tPA Given: No: Deficits rapidly resolving mRankin:  Past Medical History  Diagnosis Date  . Anemia   . Anxiety and depression     history of SI  . History of gonorrhea     History reviewed. No pertinent past surgical history.  Family history: Positive for stroke involving a grandmother.  Social History:  reports that she has never smoked. She has never used smokeless tobacco. She reports that she uses illicit drugs. She reports that she does not drink alcohol.  Allergies: No Known Allergies  Medications: Patient's preadmission medications were reviewed by me.  ROS: History obtained from the patient  General ROS: negative for - chills, fatigue, fever, night sweats, weight gain or weight loss Psychological ROS: negative for - behavioral disorder, hallucinations, memory difficulties, mood swings or suicidal ideation Ophthalmic ROS: negative for - blurry vision, double vision, eye pain or loss of vision ENT ROS: negative for - epistaxis, nasal discharge, oral lesions, sore throat, tinnitus or vertigo Allergy and Immunology ROS: negative for - hives or itchy/watery eyes Hematological and Lymphatic ROS: negative for - bleeding problems, bruising or swollen lymph nodes Endocrine ROS: negative for - galactorrhea, hair pattern  changes, polydipsia/polyuria or temperature intolerance Respiratory ROS: negative for - cough, hemoptysis, shortness of breath or wheezing Cardiovascular ROS: negative for - chest pain, dyspnea on exertion, edema or irregular heartbeat Gastrointestinal ROS: negative for - abdominal pain, diarrhea, hematemesis, nausea/vomiting or stool incontinence Genito-Urinary ROS: negative for - dysuria, hematuria, incontinence or urinary frequency/urgency Musculoskeletal ROS: Right thigh pain following delivery 6 days ago Neurological ROS: as noted in HPI Dermatological ROS: negative for rash and skin lesion changes  Physical Examination: Last menstrual period 06/12/2014, currently breastfeeding.  HEENT-  Normocephalic, no lesions, without obvious abnormality.  Normal external eye and conjunctiva.  Normal TM's bilaterally.  Normal auditory canals and external ears. Normal external nose, mucus membranes and septum.  Normal pharynx. Neck supple with no masses, nodes, nodules or enlargement. Cardiovascular - regular rate and rhythm and systolic murmur: early systolic 1/6, crescendo at 2nd right intercostal space Lungs - chest clear, no wheezing, rales, normal symmetric air entry Abdomen - soft, non-tender; bowel sounds normal; no masses,  no organomegaly Extremities - no joint deformities, effusion, or inflammation and no edema  Neurologic Examination: Mental Status: Alert, oriented, anxious and tearful.  Speech fluent without evidence of aphasia. Able to follow commands without difficulty. Cranial Nerves: II-Visual fields were normal. III/IV/VI-Pupils were equal and reacted normally to light. Extraocular movements were full and conjugate.    V/VII-no facial numbness and no facial weakness. VIII-normal. X-normal speech and symmetrical palatal movement. XI: trapezius strength/neck flexion strength normal bilaterally XII-midline tongue extension with normal strength. Motor: Minimal drift of right upper  and lower extremities; motor exam otherwise unremarkable. Sensory: Slightly reduced perception of tactile sensation over right extremities compared to  left extremities. Deep Tendon Reflexes: 2+ and symmetric. Plantars: Flexor bilaterally Cerebellar: Normal finger-to-nose testing. Carotid auscultation: Normal  Results for orders placed or performed during the hospital encounter of 03/28/15 (from the past 48 hour(s))  I-stat troponin, ED (not at [facility], [facility])     Status: None   Collection Time: 03/28/15  1:33 AM  Result Value Ref Range   Troponin i, poc 0.00 0.00 - 0.08 ng/mL   Comment 3            Comment: Due to the release kinetics of cTnI, a negative result within the first hours of the onset of symptoms does not rule out myocardial infarction with certainty. If myocardial infarction is still suspected, repeat the test at appropriate intervals.   I-Stat Chem 8, ED  (not at [facility], [facility])     Status: Abnormal   Collection Time: 03/28/15  1:35 AM  Result Value Ref Range   Sodium 143 135 - 145 mmol/L   Potassium 3.4 (L) 3.5 - 5.1 mmol/L   Chloride 108 101 - 111 mmol/L   BUN 4 (L) 6 - 20 mg/dL   Creatinine, Ser 4.09 0.44 - 1.00 mg/dL   Glucose, Bld 84 65 - 99 mg/dL   Calcium, Ion 8.11 9.14 - 1.23 mmol/L   TCO2 22 0 - 100 mmol/L   Hemoglobin 11.6 (L) 12.0 - 15.0 g/dL   HCT 78.2 (L) 95.6 - 21.3 %  Protime-INR     Status: None   Collection Time: 03/28/15  1:36 AM  Result Value Ref Range   Prothrombin Time 14.2 11.6 - 15.2 seconds   INR 1.08 0.00 - 1.49  APTT     Status: None   Collection Time: 03/28/15  1:36 AM  Result Value Ref Range   aPTT 31 24 - 37 seconds  CBC     Status: Abnormal   Collection Time: 03/28/15  1:36 AM  Result Value Ref Range   WBC 8.4 4.0 - 10.5 K/uL   RBC 4.76 3.87 - 5.11 MIL/uL   Hemoglobin 10.5 (L) 12.0 - 15.0 g/dL   HCT 08.6 (L) 57.8 - 46.9 %   MCV 71.4 (L) 78.0 - 100.0 fL   MCH 22.1 (L) 26.0 - 34.0 pg   MCHC 30.9 30.0 - 36.0 g/dL   RDW 62.9 (H) 52.8 -  15.5 %   Platelets 346 150 - 400 K/uL  Differential     Status: None   Collection Time: 03/28/15  1:36 AM  Result Value Ref Range   Neutrophils Relative % 59 %   Lymphocytes Relative 33 %   Monocytes Relative 6 %   Eosinophils Relative 2 %   Basophils Relative 0 %   Neutro Abs 4.9 1.7 - 7.7 K/uL   Lymphs Abs 2.8 0.7 - 4.0 K/uL   Monocytes Absolute 0.5 0.1 - 1.0 K/uL   Eosinophils Absolute 0.2 0.0 - 0.7 K/uL   Basophils Absolute 0.0 0.0 - 0.1 K/uL   Smear Review MORPHOLOGY UNREMARKABLE    Ct Head Wo Contrast  03/28/2015  CLINICAL DATA:  Code stroke. Slurred speech with right-sided weakness and facial droop. Last seen normal 1-1/2 hours prior. EXAM: CT HEAD WITHOUT CONTRAST TECHNIQUE: Contiguous axial images were obtained from the base of the skull through the vertex without intravenous contrast. COMPARISON:  Head CT 1 week prior 03/21/2015 FINDINGS: No intracranial hemorrhage, mass effect, or midline shift. No hydrocephalus. The basilar cisterns are patent. No evidence of territorial infarct. No intracranial fluid collection. Calvarium is  intact. Included paranasal sinuses and mastoid air cells are well aerated. IMPRESSION: No acute intracranial abnormality. These results were called by telephone at the time of interpretation on 03/28/2015 at 1:49 am to Dr. Roseanne Reno , who verbally acknowledged these results. Electronically Signed   By: Rubye Oaks M.D.   On: 03/28/2015 01:50    Assessment: 20 y.o. female with a history of preeclampsia and vaginal delivery 6 days ago presenting with right-sided weakness and speech difficulty. TIA or possible left MCA territory infarction will need to be ruled out. Risk of stroke or need to be assessed. As well, venous sinus thrombosis will also need to be ruled out.   Stroke Risk Factors - family history  Plan: 1. HgbA1c, fasting lipid panel 2. PT consult, OT consult, Speech consult 3. Echocardiogram 4. Carotid dopplers 5. Prophylactic  therapy-Antiplatelet med: Aspirin  6. Risk factor modification 7. Telemetry monitoring 8. Hypercoagulopathy panel  C.R. Roseanne Reno, MD [facility] History [phone]  03/28/2015, 2:37 AM

## 2015-03-28 NOTE — ED Provider Notes (Signed)
CSN: [id number]     Arrival date & time 03/28/15  0127 History  By signing my name below, I, Freida Busman, attest that this documentation has been prepared under the direction and in the presence of Dione Booze, MD . Electronically Signed: Freida Busman, Scribe. 03/28/2015. 1:41 AM.    No chief complaint on file.  LEVEL 5 CAVEAT DUE TO Acuity of Condition  The history is provided by the EMS personnel. No language interpreter was used.   HPI Comments:  Jessica Randall is a 20 y.o. female 6 days postpartum, who presents to the Emergency Department via EMS for difficulty speaking which began ~ 0030 this AM. EMS also reports right sided weakness and mild right sided facial droop. EMS  notes pt had pre-eclampsia during her pregnancy. Pt is right hand dominant.   Past Medical History  Diagnosis Date  . Anemia   . Anxiety and depression     history of SI  . History of gonorrhea    No past surgical history on file. No family history on file. Social History  Substance Use Topics  . Smoking status: Never Smoker   . Smokeless tobacco: Never Used  . Alcohol Use: No     Comment: occ.    OB History    Gravida Para Term Preterm AB TAB SAB Ectopic Multiple Living   0 0     1      Obstetric Comments   01/2012: 7lbs 3oz (Dr. Tiburcio Pea, [facility]) median episotomy; type of tear not documented.      Review of Systems  Unable to perform ROS: Acuity of condition   Allergies  Review of patient's allergies indicates no known allergies.  Home Medications   Prior to Admission medications   Medication Sig Start Date End Date Taking? Authorizing Provider  ferrous sulfate 325 (65 FE) MG tablet Take 1 tablet (325 mg total) by mouth 2 (two) times daily with a meal. 03/23/15   Vena Austria, MD  ibuprofen (ADVIL,MOTRIN) 600 MG tablet Take 1 tablet (600 mg total) by mouth every 6 (six) hours. 03/23/15   Vena Austria, MD  Prenatal Vit-Fe Fumarate-FA (PRENATAL MULTIVITAMIN) TABS tablet Take  1 tablet by mouth daily at 12 noon. 03/23/15   Vena Austria, MD   LMP 06/12/2014 (Approximate) Physical Exam  Constitutional: She is oriented to person, place, and time. She appears well-developed and well-nourished.  HENT:  Head: Normocephalic and atraumatic.  Eyes: Conjunctivae are normal. Pupils are equal, round, and reactive to light.  Neck: Normal range of motion. Neck supple. No JVD present.  No carotid bruits.  Cardiovascular: Normal rate and regular rhythm.   Murmur heard. 1-2/6 systolic ejection murmur left sternal border  Pulmonary/Chest: Effort normal and breath sounds normal. She has no wheezes. She has no rales. She exhibits no tenderness.  Abdominal: Soft. Bowel sounds are normal. She exhibits no distension and no mass. There is no tenderness.  Musculoskeletal: Normal range of motion. She exhibits no edema.  Lymphadenopathy:    She has no cervical adenopathy.  Neurological: She is alert and oriented to person, place, and time. No cranial nerve deficit. She exhibits normal muscle tone. Coordination normal.  Mild weakness to RUE with 4/5 strength Moderate - sever weakness in the RLE with 2/5 strength No Babinski sign  Expressive aphasia on exam  Skin: Skin is warm and dry. No rash noted.  Psychiatric:  She has significant emotional lability and is frequently crying.  Nursing note and vitals reviewed.  ED Course  Procedures   DIAGNOSTIC STUDIES:  Oxygen Saturation is 99% on room air, normal by my interpretation.     Labs Review Results for orders placed or performed during the hospital encounter of 03/28/15  Ethanol  Result Value Ref Range   Alcohol, Ethyl (B) <5 <5 mg/dL  Protime-INR  Result Value Ref Range   Prothrombin Time 14.2 11.6 - 15.2 seconds   INR 1.08 0.00 - 1.49  APTT  Result Value Ref Range   aPTT 31 24 - 37 seconds  CBC  Result Value Ref Range   WBC 8.4 4.0 - 10.5 K/uL   RBC 4.76 3.87 - 5.11 MIL/uL   Hemoglobin 10.5 (L) 12.0 - 15.0  g/dL   HCT 16.1 (L) 09.6 - 04.5 %   MCV 71.4 (L) 78.0 - 100.0 fL   MCH 22.1 (L) 26.0 - 34.0 pg   MCHC 30.9 30.0 - 36.0 g/dL   RDW 40.9 (H) 81.1 - 91.4 %   Platelets 346 150 - 400 K/uL  Differential  Result Value Ref Range   Neutrophils Relative % 59 %   Lymphocytes Relative 33 %   Monocytes Relative 6 %   Eosinophils Relative 2 %   Basophils Relative 0 %   Neutro Abs 4.9 1.7 - 7.7 K/uL   Lymphs Abs 2.8 0.7 - 4.0 K/uL   Monocytes Absolute 0.5 0.1 - 1.0 K/uL   Eosinophils Absolute 0.2 0.0 - 0.7 K/uL   Basophils Absolute 0.0 0.0 - 0.1 K/uL   Smear Review MORPHOLOGY UNREMARKABLE   Comprehensive metabolic panel  Result Value Ref Range   Sodium 142 135 - 145 mmol/L   Potassium 3.5 3.5 - 5.1 mmol/L   Chloride 109 101 - 111 mmol/L   CO2 19 (L) 22 - 32 mmol/L   Glucose, Bld 86 65 - 99 mg/dL   BUN <5 (L) 6 - 20 mg/dL   Creatinine, Ser 7.82 0.44 - 1.00 mg/dL   Calcium 9.3 8.9 - 95.6 mg/dL   Total Protein 6.6 6.5 - 8.1 g/dL   Albumin 3.0 (L) 3.5 - 5.0 g/dL   AST 22 15 - 41 U/L   ALT 22 14 - 54 U/L   Alkaline Phosphatase 126 38 - 126 U/L   Total Bilirubin 0.6 0.3 - 1.2 mg/dL   GFR calc non Af Amer >60 >60 mL/min   GFR calc Af Amer >60 >60 mL/min   Anion gap 14 5 - 15  Urine rapid drug screen (hosp performed)not at [facility]  Result Value Ref Range   Opiates NONE DETECTED NONE DETECTED   Cocaine NONE DETECTED NONE DETECTED   Benzodiazepines NONE DETECTED NONE DETECTED   Amphetamines NONE DETECTED NONE DETECTED   Tetrahydrocannabinol NONE DETECTED NONE DETECTED   Barbiturates NONE DETECTED NONE DETECTED  Urinalysis, Routine w reflex microscopic (not at [facility])  Result Value Ref Range   Color, Urine STRAW (A) YELLOW   APPearance CLOUDY (A) CLEAR   Specific Gravity, Urine 1.006 1.005 - 1.030   pH 8.0 5.0 - 8.0   Glucose, UA NEGATIVE NEGATIVE mg/dL   Hgb urine dipstick LARGE (A) NEGATIVE   Bilirubin Urine NEGATIVE NEGATIVE   Ketones, ur NEGATIVE NEGATIVE mg/dL   Protein, ur NEGATIVE  NEGATIVE mg/dL   Nitrite NEGATIVE NEGATIVE   Leukocytes, UA SMALL (A) NEGATIVE  Urine microscopic-add on  Result Value Ref Range   Squamous Epithelial / LPF 0-5 (A) NONE SEEN   WBC, UA 6-30 0 - 5 WBC/hpf   RBC /  HPF 0-5 0 - 5 RBC/hpf   Bacteria, UA FEW (A) NONE SEEN  I-Stat Chem 8, ED  (not at [facility], [facility])  Result Value Ref Range   Sodium 143 135 - 145 mmol/L   Potassium 3.4 (L) 3.5 - 5.1 mmol/L   Chloride 108 101 - 111 mmol/L   BUN 4 (L) 6 - 20 mg/dL   Creatinine, Ser 1.61 0.44 - 1.00 mg/dL   Glucose, Bld 84 65 - 99 mg/dL   Calcium, Ion 0.96 0.45 - 1.23 mmol/L   TCO2 22 0 - 100 mmol/L   Hemoglobin 11.6 (L) 12.0 - 15.0 g/dL   HCT 40.9 (L) 81.1 - 91.4 %  I-stat troponin, ED (not at [facility], [facility])  Result Value Ref Range   Troponin i, poc 0.00 0.00 - 0.08 ng/mL   Comment 3          POC CBG, ED  Result Value Ref Range   Glucose-Capillary 85 65 - 99 mg/dL   Imaging Review Ct Head Wo Contrast  03/28/2015  CLINICAL DATA:  Code stroke. Slurred speech with right-sided weakness and facial droop. Last seen normal 1-1/2 hours prior. EXAM: CT HEAD WITHOUT CONTRAST TECHNIQUE: Contiguous axial images were obtained from the base of the skull through the vertex without intravenous contrast. COMPARISON:  Head CT 1 week prior 03/21/2015 FINDINGS: No intracranial hemorrhage, mass effect, or midline shift. No hydrocephalus. The basilar cisterns are patent. No evidence of territorial infarct. No intracranial fluid collection. Calvarium is intact. Included paranasal sinuses and mastoid air cells are well aerated. IMPRESSION: No acute intracranial abnormality. These results were called by telephone at the time of interpretation on 03/28/2015 at 1:49 am to Dr. Roseanne Reno , who verbally acknowledged these results. Electronically Signed   By: Rubye Oaks M.D.   On: 03/28/2015 01:50   Mr Shirlee Latch Wo Contrast  03/28/2015  CLINICAL DATA:  Initial evaluation for acute speech difficulty, right-sided weakness. EXAM:  MRI HEAD WITHOUT CONTRAST MRA HEAD WITHOUT CONTRAST MRV HEAD WITHOUT CONTRAST TECHNIQUE: Multiplanar, multiecho pulse sequences of the brain and surrounding structures were obtained without intravenous contrast. Angiographic images of the Circle of Willis were obtained using MRA technique without intravenous contrast. Angiographic images of the intracranial venous structures were obtained using MRV technique without intravenous contrast. COMPARISON:  Prior CT from earlier the same day. FINDINGS: MRI FINDINGS: Cerebral volume within normal limits for patient age. No focal parenchymal signal abnormality. No mass lesion, midline shift, or mass effect. No hydrocephalus. No extra-axial fluid collection. No abnormal foci of restricted diffusion to suggest acute intracranial infarct. Major intracranial vascular flow voids are maintained. Gray-white matter differentiation preserved. No acute or chronic intracranial hemorrhage. No areas of chronic infarction. Craniocervical junction within normal limits. Visualized upper cervical spine unremarkable. Mild prominence of the pituitary gland present, likely within normal limits for patient age and postpartum status. No acute abnormality about the orbits. Paranasal sinuses are clear. No mastoid effusion. Inner ear structures within normal limits. Bone marrow signal intensity within normal limits. No scalp soft tissue abnormality. MRA FINDINGS: ANTERIOR CIRCULATION: Visualized distal cervical segments of the internal carotid arteries are patent with antegrade flow. Petrous, cavernous, and supraclinoid segments are widely patent. A1 segments, anterior communicating artery, and anterior cerebral arteries well opacified and normal in appearance. M1 segments patent without stenosis or occlusion. MCA bifurcations normal. Distal MCA branches symmetric and well opacified. POSTERIOR CIRCULATION: Vertebral arteries patent to the vertebrobasilar junction. Partially visualized posterior  inferior cerebral arteries patent. Basilar artery widely  patent to its distal aspect. Superior cerebral arteries well opacified. Both posterior cerebral arteries arise from the basilar artery and are well opacified to their distal aspects. Small bilateral posterior communicating arteries noted. No aneurysm or vascular malformation. MRV FINDINGS: Dedicated MRV images demonstrate normal appearance of the major dural sinuses. Specifically, the superior sagittal, transverse, and sigmoid sinuses are widely patent. Proximal internal jugular veins widely patent. Straight sinus and vein of Galen patent. No filling defect to suggest venous sinus thrombosis. The major cortical veins appear patent. IMPRESSION: MRI HEAD IMPRESSION: Normal MRI. No acute intracranial infarct or other abnormality identified. MRA HEAD IMPRESSION: Normal intracranial MRA. MRV HEAD IMPRESSION: Normal intracranial MRV.  No evidence for venous sinus thrombosis. Electronically Signed   By: Rise Mu M.D.   On: 03/28/2015 04:01   Mr Brain Wo Contrast  03/28/2015  CLINICAL DATA:  Initial evaluation for acute speech difficulty, right-sided weakness. EXAM: MRI HEAD WITHOUT CONTRAST MRA HEAD WITHOUT CONTRAST MRV HEAD WITHOUT CONTRAST TECHNIQUE: Multiplanar, multiecho pulse sequences of the brain and surrounding structures were obtained without intravenous contrast. Angiographic images of the Circle of Willis were obtained using MRA technique without intravenous contrast. Angiographic images of the intracranial venous structures were obtained using MRV technique without intravenous contrast. COMPARISON:  Prior CT from earlier the same day. FINDINGS: MRI FINDINGS: Cerebral volume within normal limits for patient age. No focal parenchymal signal abnormality. No mass lesion, midline shift, or mass effect. No hydrocephalus. No extra-axial fluid collection. No abnormal foci of restricted diffusion to suggest acute intracranial infarct. Major  intracranial vascular flow voids are maintained. Gray-white matter differentiation preserved. No acute or chronic intracranial hemorrhage. No areas of chronic infarction. Craniocervical junction within normal limits. Visualized upper cervical spine unremarkable. Mild prominence of the pituitary gland present, likely within normal limits for patient age and postpartum status. No acute abnormality about the orbits. Paranasal sinuses are clear. No mastoid effusion. Inner ear structures within normal limits. Bone marrow signal intensity within normal limits. No scalp soft tissue abnormality. MRA FINDINGS: ANTERIOR CIRCULATION: Visualized distal cervical segments of the internal carotid arteries are patent with antegrade flow. Petrous, cavernous, and supraclinoid segments are widely patent. A1 segments, anterior communicating artery, and anterior cerebral arteries well opacified and normal in appearance. M1 segments patent without stenosis or occlusion. MCA bifurcations normal. Distal MCA branches symmetric and well opacified. POSTERIOR CIRCULATION: Vertebral arteries patent to the vertebrobasilar junction. Partially visualized posterior inferior cerebral arteries patent. Basilar artery widely patent to its distal aspect. Superior cerebral arteries well opacified. Both posterior cerebral arteries arise from the basilar artery and are well opacified to their distal aspects. Small bilateral posterior communicating arteries noted. No aneurysm or vascular malformation. MRV FINDINGS: Dedicated MRV images demonstrate normal appearance of the major dural sinuses. Specifically, the superior sagittal, transverse, and sigmoid sinuses are widely patent. Proximal internal jugular veins widely patent. Straight sinus and vein of Galen patent. No filling defect to suggest venous sinus thrombosis. The major cortical veins appear patent. IMPRESSION: MRI HEAD IMPRESSION: Normal MRI. No acute intracranial infarct or other abnormality  identified. MRA HEAD IMPRESSION: Normal intracranial MRA. MRV HEAD IMPRESSION: Normal intracranial MRV.  No evidence for venous sinus thrombosis. Electronically Signed   By: Rise Mu M.D.   On: 03/28/2015 04:01   Mr [place] Lodge  03/28/2015  CLINICAL DATA:  Initial evaluation for acute speech difficulty, right-sided weakness. EXAM: MRI HEAD WITHOUT CONTRAST MRA HEAD WITHOUT CONTRAST MRV HEAD WITHOUT CONTRAST TECHNIQUE: Multiplanar, multiecho pulse sequences of the brain  and surrounding structures were obtained without intravenous contrast. Angiographic images of the Circle of Willis were obtained using MRA technique without intravenous contrast. Angiographic images of the intracranial venous structures were obtained using MRV technique without intravenous contrast. COMPARISON:  Prior CT from earlier the same day. FINDINGS: MRI FINDINGS: Cerebral volume within normal limits for patient age. No focal parenchymal signal abnormality. No mass lesion, midline shift, or mass effect. No hydrocephalus. No extra-axial fluid collection. No abnormal foci of restricted diffusion to suggest acute intracranial infarct. Major intracranial vascular flow voids are maintained. Gray-white matter differentiation preserved. No acute or chronic intracranial hemorrhage. No areas of chronic infarction. Craniocervical junction within normal limits. Visualized upper cervical spine unremarkable. Mild prominence of the pituitary gland present, likely within normal limits for patient age and postpartum status. No acute abnormality about the orbits. Paranasal sinuses are clear. No mastoid effusion. Inner ear structures within normal limits. Bone marrow signal intensity within normal limits. No scalp soft tissue abnormality. MRA FINDINGS: ANTERIOR CIRCULATION: Visualized distal cervical segments of the internal carotid arteries are patent with antegrade flow. Petrous, cavernous, and supraclinoid segments are widely patent. A1  segments, anterior communicating artery, and anterior cerebral arteries well opacified and normal in appearance. M1 segments patent without stenosis or occlusion. MCA bifurcations normal. Distal MCA branches symmetric and well opacified. POSTERIOR CIRCULATION: Vertebral arteries patent to the vertebrobasilar junction. Partially visualized posterior inferior cerebral arteries patent. Basilar artery widely patent to its distal aspect. Superior cerebral arteries well opacified. Both posterior cerebral arteries arise from the basilar artery and are well opacified to their distal aspects. Small bilateral posterior communicating arteries noted. No aneurysm or vascular malformation. MRV FINDINGS: Dedicated MRV images demonstrate normal appearance of the major dural sinuses. Specifically, the superior sagittal, transverse, and sigmoid sinuses are widely patent. Proximal internal jugular veins widely patent. Straight sinus and vein of Galen patent. No filling defect to suggest venous sinus thrombosis. The major cortical veins appear patent. IMPRESSION: MRI HEAD IMPRESSION: Normal MRI. No acute intracranial infarct or other abnormality identified. MRA HEAD IMPRESSION: Normal intracranial MRA. MRV HEAD IMPRESSION: Normal intracranial MRV.  No evidence for venous sinus thrombosis. Electronically Signed   By: Rise Mu M.D.   On: 03/28/2015 04:01   I have personally reviewed and evaluated these images and lab results as part of my medical decision-making.   EKG Interpretation   Date/Time:  Wednesday March 28 2015 03:30:20 EST Ventricular Rate:  62 PR Interval:  133 QRS Duration: 81 QT Interval:  410 QTC Calculation: 416 R Axis:   65 Text Interpretation:  Sinus rhythm Low voltage, extremity leads When  compared with ECG of 04/18/2014, Nonspecific T wave abnormality is no  longer Present Confirmed by [facility]  MD, Shakeria Robinette ([id number]) on 03/28/2015 3:35:56  AM      CRITICAL CARE Performed by:  UEAVW,[email or web address] Total critical care time: 45 minutes Critical care time was exclusive of separately billable procedures and treating other patients. Critical care was necessary to treat or prevent imminent or life-threatening deterioration. Critical care was time spent personally by me on the following activities: development of treatment plan with patient and/or surrogate as well as nursing, discussions with consultants, evaluation of patient's response to treatment, examination of patient, obtaining history from patient or surrogate, ordering and performing treatments and interventions, ordering and review of laboratory studies, ordering and review of radiographic studies, pulse oximetry and re-evaluation of patient's condition. MDM   Final diagnoses:  Transient cerebral ischemia, unspecified transient cerebral ischemia type  Episode of a fascia and right-sided weakness. However, patient is a fascia is unusual in that she is tremulous and almost stuttering when she is trying to speak. Along with her emotional lability, I suspect conversion disorder. Patient is seen in conjunction with Dr. Roseanne Reno of neurology. Head CT is unremarkable and she was sent for MR of the brain as well as MRA A and MRV of head. All of these have come back unremarkable. I have discussed this with Dr. Roseanne Reno who will request the patient be admitted to the medicine service for evaluation of possible TIA. Case is discussed with Dr. Julian Reil of [facility] who agrees to admit the patient.  I personally performed the services described in this documentation, which was scribed in my presence. The recorded information has been reviewed and is accurate.      Dione Booze, MD 03/28/15 [phone]

## 2015-03-28 NOTE — ED Notes (Signed)
Pt here for code stroke, pt had sudden onset right side weakness and slurred speech at 0030, pt reports hx of preeclampsia and delivered vaginally 6 days ago.

## 2015-03-28 NOTE — ED Notes (Addendum)
Pt mother states that pt had a fall after delivery and hit her head. States that she has been reporting headaches and rt leg pain per mother. Pt was also reported loss of taste to mother.

## 2015-03-29 ENCOUNTER — Ambulatory Visit (HOSPITAL_COMMUNITY): Payer: Medicaid Other

## 2015-03-29 ENCOUNTER — Observation Stay (HOSPITAL_BASED_OUTPATIENT_CLINIC_OR_DEPARTMENT_OTHER): Payer: Medicaid Other

## 2015-03-29 ENCOUNTER — Ambulatory Visit (HOSPITAL_BASED_OUTPATIENT_CLINIC_OR_DEPARTMENT_OTHER): Payer: Medicaid Other

## 2015-03-29 DIAGNOSIS — I10 Essential (primary) hypertension: Secondary | ICD-10-CM | POA: Diagnosis not present

## 2015-03-29 DIAGNOSIS — R2990 Unspecified symptoms and signs involving the nervous system: Secondary | ICD-10-CM

## 2015-03-29 DIAGNOSIS — I159 Secondary hypertension, unspecified: Secondary | ICD-10-CM | POA: Diagnosis not present

## 2015-03-29 LAB — BETA-2-GLYCOPROTEIN I ABS, IGG/M/A: Beta-2 Glyco I IgG: <9 GPI IgG units (ref 0–20)

## 2015-03-29 LAB — CBC
HCT: 29.6 % — ABNORMAL LOW (ref 36.0–46.0)
HEMOGLOBIN: 9 g/dL — AB (ref 12.0–15.0)
MCH: 21.7 pg — AB (ref 26.0–34.0)
MCHC: 30.4 g/dL (ref 30.0–36.0)
MCV: 71.5 fL — ABNORMAL LOW (ref 78.0–100.0)
PLATELETS: 311 10*3/uL (ref 150–400)
RBC: 4.14 MIL/uL (ref 3.87–5.11)
RDW: 17.8 % — ABNORMAL HIGH (ref 11.5–15.5)
WBC: 6.3 10*3/uL (ref 4.0–10.5)

## 2015-03-29 LAB — BASIC METABOLIC PANEL
Anion gap: 9 (ref 5–15)
BUN: 7 mg/dL (ref 6–20)
CHLORIDE: 110 mmol/L (ref 101–111)
CO2: 21 mmol/L — ABNORMAL LOW (ref 22–32)
CREATININE: 0.5 mg/dL (ref 0.44–1.00)
Calcium: 8.7 mg/dL — ABNORMAL LOW (ref 8.9–10.3)
Glucose, Bld: 87 mg/dL (ref 65–99)
POTASSIUM: 3.7 mmol/L (ref 3.5–5.1)
SODIUM: 140 mmol/L (ref 135–145)

## 2015-03-29 LAB — PROTEIN S, TOTAL: Protein S Ag, Total: 96 % (ref 60–150)

## 2015-03-29 LAB — LUPUS ANTICOAGULANT PANEL
DRVVT: 39 s (ref 0.0–44.0)
PTT Lupus Anticoagulant: 40.5 s (ref 0.0–43.6)

## 2015-03-29 LAB — PROTEIN S ACTIVITY: PROTEIN S ACTIVITY: 72 % (ref 63–140)

## 2015-03-29 LAB — PROTEIN C ACTIVITY: PROTEIN C ACTIVITY: 106 % (ref 73–180)

## 2015-03-29 LAB — HEMOGLOBIN A1C
HEMOGLOBIN A1C: 5.6 % (ref 4.8–5.6)
MEAN PLASMA GLUCOSE: 114 mg/dL

## 2015-03-29 LAB — PROTEIN C, TOTAL: PROTEIN C, TOTAL: 71 % (ref 60–150)

## 2015-03-29 LAB — HOMOCYSTEINE: HOMOCYSTEINE-NORM: 12.2 umol/L (ref 0.0–15.0)

## 2015-03-29 MED ORDER — PHENOL 1.4 % MT LIQD
1.0000 | OROMUCOSAL | Status: DC | PRN
  Administered 2015-03-29: 1 via OROMUCOSAL
  Filled 2015-03-29: qty 177

## 2015-03-29 MED ORDER — ASPIRIN EC 81 MG PO TBEC: 81.0000 mg | DELAYED_RELEASE_TABLET | Freq: Every day | ORAL | Status: DC

## 2015-03-29 MED ORDER — ASPIRIN 81 MG PO CHEW: 81.0000 mg | CHEWABLE_TABLET | Freq: Every day | ORAL | Status: DC

## 2015-03-29 NOTE — Progress Notes (Signed)
Pt discharged home with friend, by car, assessment stable, discharge instructions given, all questions answered. Pt taken by wheelchair to exit.

## 2015-03-29 NOTE — Progress Notes (Signed)
pts throat spray came up as pt was being wheeled out in wheelchair. Administered throat spray prior to exiting floor.

## 2015-03-29 NOTE — Progress Notes (Signed)
neurology and rn at bedside, initially pt said she did not want to wait for carotid and echo tests and that she wanted to do them outpatient. 5 minutes later pt called rn back and said that she wanted to stay to get tests done. rn alerted neurology and hospitalist.  Pt and visitor laying in bed together, bed alarm keeps going off because visitors keeps getting up. rn explained that she would turn the bed alarm off if pt promised she would not get up without calling for staff first.

## 2015-03-29 NOTE — Progress Notes (Signed)
rn went over discharge instructions. Pt working on getting ride. Will alert rn when ride is here. Pt eating meal at this time.

## 2015-03-29 NOTE — Care Management Note (Signed)
Case Management Note  Patient Details  Name: Jessica Randall MRN: [id number] Date of Birth: 1995-12-22  Subjective/Objective:                    Action/Plan: Patient is discharging home with self care. No further needs per CM.   Expected Discharge Date:                  Expected Discharge Plan:     In-House Referral:     Discharge planning Services     Post Acute Care Choice:    Choice offered to:     DME Arranged:    DME Agency:     HH Arranged:    HH Agency:     Status of Service:  In process, will continue to follow  Medicare Important Message Given:    Date Medicare IM Given:    Medicare IM give by:    Date Additional Medicare IM Given:    Additional Medicare Important Message give by:     If discussed at Long Length of Stay Meetings, dates discussed:    Additional Comments:  Kermit Balo, RN 03/29/2015, 11:43 AM

## 2015-03-29 NOTE — Progress Notes (Signed)
Pt resting in bed with eyes closed. Call light within reach. Stretcher in lowest position. 

## 2015-03-29 NOTE — Progress Notes (Signed)
Echo completed, now pt to vascular.

## 2015-03-29 NOTE — Care Management Note (Signed)
Case Management Note  Patient Details  Name: Jessica Randall MRN: [id number] Date of Birth: [birth year]  Subjective/Objective:                    Action/Plan: Patient presented with right-sided weakness. Lives at home with her parent. Will follow for discharge needs pending PT/OT evals and physician orders.  Expected Discharge Date:                  Expected Discharge Plan:     In-House Referral:     Discharge planning Services     Post Acute Care Choice:    Choice offered to:     DME Arranged:    DME Agency:     HH Arranged:    HH Agency:     Status of Service:  In process, will continue to follow  Medicare Important Message Given:    Date Medicare IM Given:    Medicare IM give by:    Date Additional Medicare IM Given:    Additional Medicare Important Message give by:     If discussed at Long Length of Stay Meetings, dates discussed:    Additional Comments:  Anda Kraft, RN 03/29/2015, 11:27 AM [phone]

## 2015-03-29 NOTE — Evaluation (Addendum)
Speech Language Pathology Evaluation Patient Details Name: Jessica Randall MRN: [id number] DOB: September 28, 1995 Today's Date: 03/29/2015 Time: 4098-1191 SLP Time Calculation (min) (ACUTE ONLY): 27 min  Problem List:  Patient Active Problem List   Diagnosis Date Noted  . TIA (transient ischemic attack) 03/28/2015  . Stroke-like symptoms 03/28/2015  . Hypertension 03/28/2015  . Hypokalemia 03/28/2015  . Anemia 03/28/2015  . Stroke (HCC) 03/28/2015  . Postpartum complication   . Pregnancy 03/21/2015  . Postpartum care following vaginal delivery 03/21/2015  . Labor and delivery, indication for care 01/12/2015  . Labor and delivery indication for care or intervention 01/06/2015   Past Medical History:  Past Medical History  Diagnosis Date  . Anemia   . Anxiety and depression     history of SI  . History of gonorrhea    Past Surgical History: History reviewed. No pertinent past surgical history. HPI:  20 y.o. female with a Past Medical History of Anemia, Depression who presents with stroke like symptoms; MRI head on 03/28/15 indicated Normal MRI. No acute intracranial infarct or other abnormality identified.  Assessment / Plan / Recommendation Clinical Impression   Pt exhibits mild aphasia characterized primarily by anomia during conversational tasks intermittently and mild left labial/lingual sensory impairment with OME during SLE; pt indicated she had difficulty with taste as well since admission; pt's auditory comprehension and cognition appears [facility] with basic and complex (for education level) tasks; pt denies any reading/writing difficulty at this time, although this was not formally assessed d/t time constraints.  ST to f/u 1-2x while in house or have HH ST assess upon D/C prn if symptoms persist.    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)   Cognition  Overall Cognitive Status: Within Functional Limits for tasks assessed Arousal/Alertness: Awake/alert Orientation Level: Oriented X4 Memory: Appears intact Awareness: Appears intact Problem Solving: Appears intact Safety/Judgment: Appears intact    Comprehension  Auditory Comprehension Overall Auditory Comprehension: Appears within functional limits for tasks assessed Yes/No Questions: Within Functional Limits Commands: Within Functional Limits Conversation: Complex Visual Recognition/Discrimination Discrimination: Within Function Limits Reading Comprehension Reading Status: Not tested    Expression Expression Primary Mode of Expression: Verbal Verbal Expression Overall Verbal Expression: Impaired Initiation: No impairment Level of Generative/Spontaneous Verbalization: Conversation Repetition: No impairment Naming: Impairment Responsive: 76-100% accurate Confrontation: Within functional limits Convergent: 50-74% accurate Divergent: 50-74% accurate Other Naming Comments: some naming tasks could be related to education level Pragmatics: No impairment Non-Verbal Means of Communication: Not applicable Other Verbal Expression Comments:  (Pt states she has difficulty "finding her words" at times) Written Expression Written Expression: Not tested   Oral / Motor  Oral Motor/Sensory Function Overall Oral Motor/Sensory Function: Mild impairment Facial ROM: Reduced left Facial Symmetry: Abnormal symmetry left;Suspected CN VII (facial) dysfunction Facial Strength: Within Functional Limits Facial Sensation: Reduced left Lingual ROM: Within Functional Limits Lingual Symmetry: Within Functional  Limits Lingual Strength: Within Functional Limits Lingual Sensation: Reduced Velum: Within Functional Limits Mandible: Within Functional Limits Motor Speech Overall Motor Speech: Appears within functional limits for tasks assessed Respiration: Within functional limits Phonation: Normal Resonance: Within functional limits Articulation: Impaired Level of  Impairment: Conversation Intelligibility: Intelligibility reduced Word: 75-100% accurate Phrase: 75-100% accurate Sentence: 50-74% accurate Conversation: 50-74% accurate Motor Planning: Within functional limits Motor Speech Errors: Not applicable Effective Techniques: Slow rate;Increased vocal intensity                       Tegh Franek,PAT, M.S., CCC-SLP 03/29/2015, 3:57 PM

## 2015-03-29 NOTE — Progress Notes (Signed)
*  PRELIMINARY RESULTS* Vascular Ultrasound Carotid Duplex (Doppler) has been completed.   There is no obvious evidence of hemodynamically significant internal carotid artery stenosis bilaterally. Vertebral arteries are patent with antegrade flow.  03/29/2015 4:06 PM Gertie Fey, RVT, RDCS, RDMS

## 2015-03-29 NOTE — Progress Notes (Signed)
Echocardiogram 2D Echocardiogram has been performed.  Jessica Randall 03/29/2015, 3:11 PM

## 2015-03-29 NOTE — Evaluation (Signed)
Physical Therapy Evaluation Patient Details Name: Jessica Randall MRN: [id number] DOB: 12/24/1995 Today's Date: 03/29/2015   History of Present Illness  20 yo female with BP of 149/107 at admission for delivery of her second child.  R side weakness and late prenatal care noted, PMHx:  depression, anemia  Clinical Impression  Pt demonstrates good control of walking on the hallway, with no LOB or stumbling with gait on no AD.  Independent to get on and off bed, to transfer and so no further PT needs are identified.    Follow Up Recommendations No PT follow up    Equipment Recommendations  None recommended by PT    Recommendations for Other Services       Precautions / Restrictions Precautions Precautions: Fall;Other (comment) (telemetry) Restrictions Weight Bearing Restrictions: No      Mobility  Bed Mobility Overal bed mobility: Modified Independent                Transfers Overall transfer level: Modified independent Equipment used: None             General transfer comment: able to stand pushing off bed  Ambulation/Gait Ambulation/Gait assistance: Min guard Ambulation Distance (Feet): 200 Feet (then 350) Assistive device: Rolling walker (2 wheeled);None Gait Pattern/deviations: Step-through pattern;Wide base of support Gait velocity: normal Gait velocity interpretation: at or above normal speed for age/gender General Gait Details: pt is walking with gait belt only on second walk with normal narrow based gait and no LOB  Stairs            Wheelchair Mobility    Modified Rankin (Stroke Patients Only)       Balance Overall balance assessment: Modified Independent                                           Pertinent Vitals/Pain Pain Assessment: No/denies pain    Home Living Family/patient expects to be discharged to:: Private residence Living Arrangements: Parent Available Help at Discharge: Family;Friend(s);Available 24  hours/day Type of Home: House       Home Layout: One level Home Equipment: None      Prior Function Level of Independence: Independent               Hand Dominance        Extremity/Trunk Assessment   Upper Extremity Assessment: Overall WFL for tasks assessed           Lower Extremity Assessment: Overall WFL for tasks assessed      Cervical / Trunk Assessment: Normal  Communication   Communication: No difficulties  Cognition Arousal/Alertness: Awake/alert Behavior During Therapy: WFL for tasks assessed/performed Overall Cognitive Status: Within Functional Limits for tasks assessed                      General Comments General comments (skin integrity, edema, etc.): Pt did not show any signs of needing assistance for any mobility by end of session    Exercises        Assessment/Plan    PT Assessment Patent does not need any further PT services  PT Diagnosis Difficulty walking   PT Problem List    PT Treatment Interventions     PT Goals (Current goals can be found in the Care Plan section) Acute Rehab PT Goals Patient Stated Goal: to feel better    Frequency  Barriers to discharge        Co-evaluation               End of Session Equipment Utilized During Treatment: Gait belt Activity Tolerance: Patient tolerated treatment well Patient left: in bed;with call bell/phone within reach;with family/visitor present Nurse Communication: Mobility status    Functional Assessment Tool Used: clinical judgment Functional Limitation: Mobility: Walking and moving around Mobility: Walking and Moving Around Current Status [phone]): 0 percent impaired, limited or restricted Mobility: Walking and Moving Around Goal Status [phone]): 0 percent impaired, limited or restricted Mobility: Walking and Moving Around Discharge Status [phone]): 0 percent impaired, limited or restricted    Time: 1335-1405 PT Time Calculation (min) (ACUTE ONLY): 30  min   Charges:   PT Evaluation $PT Eval Low Complexity: 1 Procedure PT Treatments $Gait Training: 8-22 mins   PT G Codes:   PT G-Codes **NOT FOR INPATIENT CLASS** Functional Assessment Tool Used: clinical judgment Functional Limitation: Mobility: Walking and moving around Mobility: Walking and Moving Around Current Status (O1308): 0 percent impaired, limited or restricted Mobility: Walking and Moving Around Goal Status (M5784): 0 percent impaired, limited or restricted Mobility: Walking and Moving Around Discharge Status (O9629): 0 percent impaired, limited or restricted    Ivar Drape 03/29/2015, 3:49 PM   Samul Dada, PT MS Acute Rehab Dept. Number: [id number] and [phone] [id number]

## 2015-03-29 NOTE — Discharge Summary (Addendum)
Physician Discharge Summary  Jessica Randall ONG:[id number] DOB: Aug 15, 1995 DOA: 03/28/2015  PCP: No PCP Per Patient  Admit date: 03/28/2015 Discharge date: 03/29/2015  Time spent: 25 min minutes  Recommendations for Outpatient Follow-up:  1. Follow-up neurology in one week   Discharge Diagnoses:  Principal Problem:   Stroke-like symptoms Active Problems:   Hypertension   Hypokalemia   Anemia   Stroke [facility])   Discharge Condition: Stable  Diet recommendation: Regular diet   History of present illness:  20 y.o. female with a Past Medical History of Anemia, Depression who presents with stroke like symptoms.   In short patient is aproximatly 6 days postpartum after vaginal delivery. Pregnancy was comlicated by late prenatal care and preeclampsia. Patient delivered at [facility]. Of note at time of admission patient's blood pressure was 149/107. Patient does not yet meet criteria for postpartum preeclampsia but elevated pressure concerning. Goal blood pressure is less than 140/90. Patient without any protein in urine, LFTs normal, platelet count normal, so no evidence of postpartum HELLP syndrome.     Hospital Course:  Right lower extremity weakness Patient symptoms have improved though she continues to have right leg weakness. Workup for the stroke including MRI/MRA brain were negative. Patient was seen by neurology Dr. Lucia Gaskins, who recommended to discharge the patient home on aspirin 81 mg by mouth daily and she will follow-up up in the clinic in one week.  Hypertension Blood pressure stable, this morning pressure was 131/87  Echo shows trace pericardial effusion, likely from pregnancy. Carotid Duplex (Doppler) has been completed.  There is no obvious evidence of hemodynamically significant internal carotid artery stenosis bilaterally. Vertebral arteries are patent with antegrade flow.  Consultations:  Neurology  Discharge Exam: Filed Vitals:   03/29/15 0800  03/29/15 1027  BP: 144/96 131/87  Pulse: 66 82  Temp: 98.6 F (37 C) 99 F (37.2 C)  Resp: 17 16    General: *Appears in no acute distress Cardiovascular: S1-S2 regular Respiratory: Clear to auscultation bilaterally  Discharge Instructions   Discharge Instructions    Diet - low sodium heart healthy    Complete by:  As directed      Increase activity slowly    Complete by:  As directed           Current Discharge Medication List    START taking these medications   Details  aspirin EC 81 MG tablet Take 1 tablet (81 mg total) by mouth daily. Qty: 30 tablet, Refills: 1      CONTINUE these medications which have NOT CHANGED   Details  ferrous sulfate 325 (65 FE) MG tablet Take 1 tablet (325 mg total) by mouth 2 (two) times daily with a meal. Qty: 30 tablet, Refills: 1    ibuprofen (ADVIL,MOTRIN) 600 MG tablet Take 1 tablet (600 mg total) by mouth every 6 (six) hours. Qty: 30 tablet, Refills: 0    Prenatal Vit-Fe Fumarate-FA (PRENATAL MULTIVITAMIN) TABS tablet Take 1 tablet by mouth daily at 12 noon. Qty: 30 tablet, Refills: 6       No Known Allergies Follow-up Information    Follow up with Anson Fret, MD In 1 week.   Specialty:  Neurology   Contact information:   [address] [place] [address] [phone]        The results of significant diagnostics from this hospitalization (including imaging, microbiology, ancillary and laboratory) are listed below for reference.    Significant Diagnostic Studies: Dg Chest 2 View  03/28/2015  CLINICAL DATA:  20 year old female with stroke-like symptoms, abnormal speech and right side weakness. Initial encounter. EXAM: CHEST  2 VIEW COMPARISON:  Head CT without contrast 0133 hours today. FINDINGS: Seated upright AP and lateral views of the chest. Somewhat low lung volumes. Mediastinal contours within normal limits. Visualized tracheal air column is within normal limits. No pneumothorax, pulmonary edema,  pleural effusion or confluent pulmonary opacity. No osseous abnormality identified. IMPRESSION: Low lung volumes, otherwise no acute cardiopulmonary abnormality. Electronically Signed   By: Odessa Fleming M.D.   On: 03/28/2015 07:29   Ct Head Wo Contrast  03/28/2015  CLINICAL DATA:  Code stroke. Slurred speech with right-sided weakness and facial droop. Last seen normal 1-1/2 hours prior. EXAM: CT HEAD WITHOUT CONTRAST TECHNIQUE: Contiguous axial images were obtained from the base of the skull through the vertex without intravenous contrast. COMPARISON:  Head CT 1 week prior 03/21/2015 FINDINGS: No intracranial hemorrhage, mass effect, or midline shift. No hydrocephalus. The basilar cisterns are patent. No evidence of territorial infarct. No intracranial fluid collection. Calvarium is intact. Included paranasal sinuses and mastoid air cells are well aerated. IMPRESSION: No acute intracranial abnormality. These results were called by telephone at the time of interpretation on 03/28/2015 at 1:49 am to Dr. Roseanne Reno , who verbally acknowledged these results. Electronically Signed   By: Rubye Oaks M.D.   On: 03/28/2015 01:50   Ct Head Wo Contrast  03/21/2015  CLINICAL DATA:  20 year old female status post unwitnessed fall in her room. Recently postpartum. EXAM: CT HEAD WITHOUT CONTRAST TECHNIQUE: Contiguous axial images were obtained from the base of the skull through the vertex without intravenous contrast. COMPARISON:  None FINDINGS: Negative for acute intracranial hemorrhage, acute infarction, mass, mass effect, hydrocephalus or midline shift. Gray-white differentiation is preserved throughout. No acute soft tissue or calvarial abnormality. The globes and orbits are symmetric and unremarkable. Normal aeration of the mastoid air cells and visualized paranasal sinuses. IMPRESSION: Negative head CT. Electronically Signed   By: Malachy Moan M.D.   On: 03/21/2015 19:51   Mr Maxine Glenn Head Wo Contrast  03/28/2015   CLINICAL DATA:  Initial evaluation for acute speech difficulty, right-sided weakness. EXAM: MRI HEAD WITHOUT CONTRAST MRA HEAD WITHOUT CONTRAST MRV HEAD WITHOUT CONTRAST TECHNIQUE: Multiplanar, multiecho pulse sequences of the brain and surrounding structures were obtained without intravenous contrast. Angiographic images of the Circle of Willis were obtained using MRA technique without intravenous contrast. Angiographic images of the intracranial venous structures were obtained using MRV technique without intravenous contrast. COMPARISON:  Prior CT from earlier the same day. FINDINGS: MRI FINDINGS: Cerebral volume within normal limits for patient age. No focal parenchymal signal abnormality. No mass lesion, midline shift, or mass effect. No hydrocephalus. No extra-axial fluid collection. No abnormal foci of restricted diffusion to suggest acute intracranial infarct. Major intracranial vascular flow voids are maintained. Gray-white matter differentiation preserved. No acute or chronic intracranial hemorrhage. No areas of chronic infarction. Craniocervical junction within normal limits. Visualized upper cervical spine unremarkable. Mild prominence of the pituitary gland present, likely within normal limits for patient age and postpartum status. No acute abnormality about the orbits. Paranasal sinuses are clear. No mastoid effusion. Inner ear structures within normal limits. Bone marrow signal intensity within normal limits. No scalp soft tissue abnormality. MRA FINDINGS: ANTERIOR CIRCULATION: Visualized distal cervical segments of the internal carotid arteries are patent with antegrade flow. Petrous, cavernous, and supraclinoid segments are widely patent. A1 segments, anterior communicating artery, and anterior cerebral arteries well opacified and  normal in appearance. M1 segments patent without stenosis or occlusion. MCA bifurcations normal. Distal MCA branches symmetric and well opacified. POSTERIOR CIRCULATION:  Vertebral arteries patent to the vertebrobasilar junction. Partially visualized posterior inferior cerebral arteries patent. Basilar artery widely patent to its distal aspect. Superior cerebral arteries well opacified. Both posterior cerebral arteries arise from the basilar artery and are well opacified to their distal aspects. Small bilateral posterior communicating arteries noted. No aneurysm or vascular malformation. MRV FINDINGS: Dedicated MRV images demonstrate normal appearance of the major dural sinuses. Specifically, the superior sagittal, transverse, and sigmoid sinuses are widely patent. Proximal internal jugular veins widely patent. Straight sinus and vein of Galen patent. No filling defect to suggest venous sinus thrombosis. The major cortical veins appear patent. IMPRESSION: MRI HEAD IMPRESSION: Normal MRI. No acute intracranial infarct or other abnormality identified. MRA HEAD IMPRESSION: Normal intracranial MRA. MRV HEAD IMPRESSION: Normal intracranial MRV.  No evidence for venous sinus thrombosis. Electronically Signed   By: Rise Mu M.D.   On: 03/28/2015 04:01   Mr Brain Wo Contrast  03/28/2015  CLINICAL DATA:  Initial evaluation for acute speech difficulty, right-sided weakness. EXAM: MRI HEAD WITHOUT CONTRAST MRA HEAD WITHOUT CONTRAST MRV HEAD WITHOUT CONTRAST TECHNIQUE: Multiplanar, multiecho pulse sequences of the brain and surrounding structures were obtained without intravenous contrast. Angiographic images of the Circle of Willis were obtained using MRA technique without intravenous contrast. Angiographic images of the intracranial venous structures were obtained using MRV technique without intravenous contrast. COMPARISON:  Prior CT from earlier the same day. FINDINGS: MRI FINDINGS: Cerebral volume within normal limits for patient age. No focal parenchymal signal abnormality. No mass lesion, midline shift, or mass effect. No hydrocephalus. No extra-axial fluid collection. No  abnormal foci of restricted diffusion to suggest acute intracranial infarct. Major intracranial vascular flow voids are maintained. Gray-white matter differentiation preserved. No acute or chronic intracranial hemorrhage. No areas of chronic infarction. Craniocervical junction within normal limits. Visualized upper cervical spine unremarkable. Mild prominence of the pituitary gland present, likely within normal limits for patient age and postpartum status. No acute abnormality about the orbits. Paranasal sinuses are clear. No mastoid effusion. Inner ear structures within normal limits. Bone marrow signal intensity within normal limits. No scalp soft tissue abnormality. MRA FINDINGS: ANTERIOR CIRCULATION: Visualized distal cervical segments of the internal carotid arteries are patent with antegrade flow. Petrous, cavernous, and supraclinoid segments are widely patent. A1 segments, anterior communicating artery, and anterior cerebral arteries well opacified and normal in appearance. M1 segments patent without stenosis or occlusion. MCA bifurcations normal. Distal MCA branches symmetric and well opacified. POSTERIOR CIRCULATION: Vertebral arteries patent to the vertebrobasilar junction. Partially visualized posterior inferior cerebral arteries patent. Basilar artery widely patent to its distal aspect. Superior cerebral arteries well opacified. Both posterior cerebral arteries arise from the basilar artery and are well opacified to their distal aspects. Small bilateral posterior communicating arteries noted. No aneurysm or vascular malformation. MRV FINDINGS: Dedicated MRV images demonstrate normal appearance of the major dural sinuses. Specifically, the superior sagittal, transverse, and sigmoid sinuses are widely patent. Proximal internal jugular veins widely patent. Straight sinus and vein of Galen patent. No filling defect to suggest venous sinus thrombosis. The major cortical veins appear patent. IMPRESSION: MRI  HEAD IMPRESSION: Normal MRI. No acute intracranial infarct or other abnormality identified. MRA HEAD IMPRESSION: Normal intracranial MRA. MRV HEAD IMPRESSION: Normal intracranial MRV.  No evidence for venous sinus thrombosis. Electronically Signed   By: Rise Mu M.D.   On: 03/28/2015 04:01  Mr [place] Lodge  03/28/2015  CLINICAL DATA:  Initial evaluation for acute speech difficulty, right-sided weakness. EXAM: MRI HEAD WITHOUT CONTRAST MRA HEAD WITHOUT CONTRAST MRV HEAD WITHOUT CONTRAST TECHNIQUE: Multiplanar, multiecho pulse sequences of the brain and surrounding structures were obtained without intravenous contrast. Angiographic images of the Circle of Willis were obtained using MRA technique without intravenous contrast. Angiographic images of the intracranial venous structures were obtained using MRV technique without intravenous contrast. COMPARISON:  Prior CT from earlier the same day. FINDINGS: MRI FINDINGS: Cerebral volume within normal limits for patient age. No focal parenchymal signal abnormality. No mass lesion, midline shift, or mass effect. No hydrocephalus. No extra-axial fluid collection. No abnormal foci of restricted diffusion to suggest acute intracranial infarct. Major intracranial vascular flow voids are maintained. Gray-white matter differentiation preserved. No acute or chronic intracranial hemorrhage. No areas of chronic infarction. Craniocervical junction within normal limits. Visualized upper cervical spine unremarkable. Mild prominence of the pituitary gland present, likely within normal limits for patient age and postpartum status. No acute abnormality about the orbits. Paranasal sinuses are clear. No mastoid effusion. Inner ear structures within normal limits. Bone marrow signal intensity within normal limits. No scalp soft tissue abnormality. MRA FINDINGS: ANTERIOR CIRCULATION: Visualized distal cervical segments of the internal carotid arteries are patent with antegrade  flow. Petrous, cavernous, and supraclinoid segments are widely patent. A1 segments, anterior communicating artery, and anterior cerebral arteries well opacified and normal in appearance. M1 segments patent without stenosis or occlusion. MCA bifurcations normal. Distal MCA branches symmetric and well opacified. POSTERIOR CIRCULATION: Vertebral arteries patent to the vertebrobasilar junction. Partially visualized posterior inferior cerebral arteries patent. Basilar artery widely patent to its distal aspect. Superior cerebral arteries well opacified. Both posterior cerebral arteries arise from the basilar artery and are well opacified to their distal aspects. Small bilateral posterior communicating arteries noted. No aneurysm or vascular malformation. MRV FINDINGS: Dedicated MRV images demonstrate normal appearance of the major dural sinuses. Specifically, the superior sagittal, transverse, and sigmoid sinuses are widely patent. Proximal internal jugular veins widely patent. Straight sinus and vein of Galen patent. No filling defect to suggest venous sinus thrombosis. The major cortical veins appear patent. IMPRESSION: MRI HEAD IMPRESSION: Normal MRI. No acute intracranial infarct or other abnormality identified. MRA HEAD IMPRESSION: Normal intracranial MRA. MRV HEAD IMPRESSION: Normal intracranial MRV.  No evidence for venous sinus thrombosis. Electronically Signed   By: Rise Mu M.D.   On: 03/28/2015 04:01    Microbiology: Recent Results (from the past 240 hour(s))  Chlamydia/NGC rt PCR ([facility] only)     Status: None   Collection Time: 03/21/15  7:39 AM  Result Value Ref Range Status   Specimen source GC/Chlam URINE, RANDOM  Final   Chlamydia Tr NOT DETECTED NOT DETECTED Final   N gonorrhoeae NOT DETECTED NOT DETECTED Final    Comment: (NOTE) 100  This methodology has not been evaluated in pregnant women or in 200  patients with a history of hysterectomy. 300 400  This methodology will not be  performed on patients less than 63  years of age.      Labs: Basic Metabolic Panel:  Recent Labs Lab 03/28/15 0135 03/28/15 0136 03/29/15 0020  NA 143 142 140  K 3.4* 3.5 3.7  CL 108 109 110  CO2  --  19* 21*  GLUCOSE 84 86 87  BUN 4* <5* 7  CREATININE 0.50 0.59 0.50  CALCIUM  --  9.3 8.7*   Liver Function Tests:  Recent Labs  Lab 03/28/15 0136 03/28/15 1650  AST 22 19  ALT 22 17  ALKPHOS 126 115  BILITOT 0.6 0.6  PROT 6.6 6.3*  ALBUMIN 3.0* 2.7*   No results for input(s): LIPASE, AMYLASE in the last 168 hours. No results for input(s): AMMONIA in the last 168 hours. CBC:  Recent Labs Lab 03/28/15 0135 03/28/15 0136 03/29/15 0020  WBC  --  8.4 6.3  NEUTROABS  --  4.9  --   HGB 11.6* 10.5* 9.0*  HCT 34.0* 34.0* 29.6*  MCV  --  71.4* 71.5*  PLT  --  346 311    CBG:  Recent Labs Lab 03/28/15 0333  GLUCAP 85       Signed:  LAMA,GAGAN S MD.  [facility] 03/29/2015, 11:40 AM

## 2015-03-29 NOTE — Progress Notes (Signed)
Pt back in room.

## 2015-03-29 NOTE — Progress Notes (Signed)
STROKE TEAM PROGRESS NOTE   HISTORY OF PRESENT ILLNESS Jessica Randall is an 20 y.o. female with a history of anxiety and depression as well as anemia, brought to the emergency room and code stroke status following acute onset of right side weakness and speech difficulty at 12:30 AM today. Patient is 6 days postpartum, uncomplicated vaginal delivery. Patient had preeclampsia. CT scan of her head showed no acute intracranial abnormality. Deficits improved rapidly following arrival in the ED. NIH stroke score was 3. MRI of the brain, MR angiogram and MR venogram had been ordered and are pending.  LSN: 12:30 AM on 03/28/2015 tPA Given: No: Deficits rapidly resolving mRankin:   SUBJECTIVE (INTERVAL HISTORY) No family is at the bedside with boyfriend is in bed with her. Patient says her symptoms is resolved. She still has some right-sided leg weakness that she has been experiencing since delivery of baby approximately 1 week ago.   OBJECTIVE Temp:  [97.7 F (36.5 C)-98.6 F (37 C)] 97.7 F (36.5 C) (02/23 0530) Pulse Rate:  [61-110] 61 (02/23 0530) Cardiac Rhythm:  [-] Normal sinus rhythm (02/22 1900) Resp:  [9-24] 18 (02/23 0530) BP: (115-144)/(68-110) 142/78 mmHg (02/23 0530) SpO2:  [97 %-100 %] 99 % (02/23 0530)  CBC:  Recent Labs Lab 03/28/15 0136 03/29/15 0020  WBC 8.4 6.3  NEUTROABS 4.9  --   HGB 10.5* 9.0*  HCT 34.0* 29.6*  MCV 71.4* 71.5*  PLT 346 311    Basic Metabolic Panel:  Recent Labs Lab 03/28/15 0136 03/29/15 0020  NA 142 140  K 3.5 3.7  CL 109 110  CO2 19* 21*  GLUCOSE 86 87  BUN <5* 7  CREATININE 0.59 0.50  CALCIUM 9.3 8.7*    Lipid Panel:    Component Value Date/Time   CHOL 234* 03/28/2015 0136   TRIG 108 03/28/2015 0136   HDL 44 03/28/2015 0136   CHOLHDL 5.3 03/28/2015 0136   VLDL 22 03/28/2015 0136   LDLCALC 168* 03/28/2015 0136   HgbA1c:  Lab Results  Component Value Date   HGBA1C 5.6 03/28/2015   Urine Drug Screen:    Component Value  Date/Time   LABOPIA NONE DETECTED 03/28/2015 0231   LABOPIA NONE DETECTED 03/21/2015 0739   COCAINSCRNUR NONE DETECTED 03/28/2015 0231   LABBENZ NONE DETECTED 03/28/2015 0231   LABBENZ NONE DETECTED 03/21/2015 0739   AMPHETMU NONE DETECTED 03/28/2015 0231   AMPHETMU NONE DETECTED 03/21/2015 0739   THCU NONE DETECTED 03/28/2015 0231   THCU NONE DETECTED 03/21/2015 0739   LABBARB NONE DETECTED 03/28/2015 0231   LABBARB NONE DETECTED 03/21/2015 0739      IMAGING   Dg Chest 2 View 03/28/2015   Low lung volumes, otherwise no acute cardiopulmonary abnormality.    Ct Head Wo Contrast 03/28/2015    No acute intracranial abnormality.    Mr Maxine Glenn Head Wo Contrast 03/28/2015    MRI HEAD  Normal MRI. No acute intracranial infarct or other abnormality identified.   MRA HEAD  Normal intracranial MRA.   MRV HEAD Normal intracranial MRV.  No evidence for venous sinus thrombosis. E    PHYSICAL EXAM  Physical exam: Exam: Gen: NAD, conversant, well nourised, obese, well groomed                     CV: RRR, no MRG. No Carotid Bruits. No peripheral edema, warm, nontender Eyes: Conjunctivae clear without exudates or hemorrhage  Neuro: Detailed Neurologic Exam  Speech:    Speech is normal;  fluent and spontaneous with normal comprehension.  Cognition:    The patient is oriented to person, place, and time;     recent and remote memory intact;     language fluent;     normal attention, concentration,     fund of knowledge Cranial Nerves:    The pupils are equal, round, and reactive to light. The fundi are normal and spontaneous venous pulsations are present. Visual fields are full to finger confrontation. Extraocular movements are intact. Trigeminal sensation is intact and the muscles of mastication are normal. The face is symmetric. The palate elevates in the midline. Hearing intact. Voice is normal. Shoulder shrug is normal. The tongue has normal motion without fasciculations.    Coordination:    Normal finger to nose and heel to shin. Normal rapid alternating movements.   Gait:    Heel-toe and tandem gait are normal.   Motor Observation:    No asymmetry, no atrophy, and no involuntary movements noted. Tone:    Normal muscle tone.    Posture:    Posture is normal. normal erect    Strength:    Mild right leg weakness that has been going on since delivery approximately a week ago. Otherwise strength is V/V in the upper and lower limbs.      Sensation: intact to LT     Reflex Exam:  DTR's:    Deep tendon reflexes in the upper and lower extremities are normal bilaterally.   Toes:    The toes are downgoing bilaterally.   Clonus:    Clonus is absent.   ASSESSMENT/PLAN [name] is a 20 y.o. female with history of  anemia, anxiety, depression, substance abuse history (UDS - neg) and recent child birth complicated by preeclampsia  presenting with right side weakness and speech difficulty.  She did not receive IV t-PA due to resolution of deficits.   Possible TIA:  Dominant   Resultant  resolution  MRI negative  MRA  negative  MRV - negative  Carotid Doppler pending  2D Echo pending  LDL - 168  HgbA1c 5.6  VTE prophylaxis - Lovenox  Diet regular Room service appropriate?: Yes; Fluid consistency:: Thin  No antithrombotic prior to admission, now on aspirin 325 mg daily. Can discharge on ASA .   Patient counseled to be compliant with her antithrombotic medications  Ongoing aggressive stroke risk factor management  Therapy recommendations:  None  Disposition: Home  Hypertension  Stable  Permissive hypertension (OK if < 220/120) but gradually normalize in 5-7 days  Hyperlipidemia  Home meds: No lipid lowering medications prior to admission.  LDL 168, goal < 70  Statins contraindicated during breast-feeding.    Other Stroke Risk Factors  Post partum  Other Active Problems  Substance abuse history (UDS -  negative)  Anemia  Hospital day #   Delton See PA-C [facility] Pager [phone] 03/29/2015, 11:25 AM    Personally examined patient and images, and have participated in and made any corrections needed to history, physical, neuro exam,assessment and plan as stated above.  I have personally obtained the history, evaluated lab date, reviewed imaging studies and agree with radiology interpretations.   Patient had acute onset onset of right side weakness and speech difficulty. MRI of the brain, MRA of the head and all workup has been unremarkable. Patient can be discharged on aspirin 81 mg. Her cholesterol is high at 168 however she is breast-feeding and statins contraindicated.  She has spent her entire  time in the hospital in bed with her boyfriend despite having a baby in a 53-year-old at home. She is in no acute distress and in no apparent hurry to go home.Have asked her to follow up with me in the office in 1 week so I can watch her very closely however my suspicion of TIA or stroke is very low. If echocardiogram carotid Dopplers are unremarkable stroke team will not see her tomorrow and I will see her in the office in a week.  Naomie Dean, MD Stroke Neurology [phone] [facility]    To contact Stroke Continuity provider, please refer to [email or web address]. After hours, contact General Neurology

## 2015-03-29 NOTE — Discharge Instructions (Signed)
Hypertension During Pregnancy Hypertension is also called high blood pressure. Blood pressure moves blood in your body. Sometimes, the force that moves the blood becomes too strong. When you are pregnant, this condition should be watched carefully. It can cause problems for you and your baby. HOME CARE   Make and keep all of your doctor visits.  Take medicine as told by your doctor. Tell your doctor about all medicines you take.  Eat very little salt.  Exercise regularly.  Do not drink alcohol.  Do not smoke.  Do not have drinks with caffeine.  Lie on your left side when resting.  Your health care provider may ask you to take one low-dose aspirin ( ) each day. GET HELP RIGHT AWAY IF:  You have bad belly (abdominal) pain.  You have sudden puffiness (swelling) in the hands, ankles, or face.  You gain 4 pounds (1.8 kilograms) or more in 1 week.  You throw up (vomit) repeatedly.  You have bleeding from the vagina.  You do not feel the baby moving as much.  You have a headache.  You have blurred or double vision.  You have muscle twitching or spasms.  You have shortness of breath.  You have blue fingernails and lips.  You have blood in your pee (urine). MAKE SURE YOU:  Understand these instructions.  Will watch your condition.  Will get help right away if you are not doing well or get worse.   This information is not intended to replace advice given to you by your health care provider. Make sure you discuss any questions you have with your health care provider.   Document Released: 02/22/2010 Document Revised: 02/10/2014 Document Reviewed: 08/19/2012 Elsevier Interactive Patient Education 2016 [employer]. Anemia, Nonspecific Anemia is a condition in which the concentration of red blood cells or hemoglobin in the blood is below normal. Hemoglobin is a substance in red blood cells that carries oxygen to the tissues of the body. Anemia results in not enough  oxygen reaching these tissues.  CAUSES  Common causes of anemia include:   Excessive bleeding. Bleeding may be internal or external. This includes excessive bleeding from periods (in women) or from the intestine.   Poor nutrition.   Chronic kidney, thyroid, and liver disease.  Bone marrow disorders that decrease red blood cell production.  Cancer and treatments for cancer.  HIV, AIDS, and their treatments.  Spleen problems that increase red blood cell destruction.  Blood disorders.  Excess destruction of red blood cells due to infection, medicines, and autoimmune disorders. SIGNS AND SYMPTOMS   Minor weakness.   Dizziness.   Headache.  Palpitations.   Shortness of breath, especially with exercise.   Paleness.  Cold sensitivity.  Indigestion.  Nausea.  Difficulty sleeping.  Difficulty concentrating. Symptoms may occur suddenly or they may develop slowly.  DIAGNOSIS  Additional blood tests are often needed. These help your health care provider determine the best treatment. Your health care provider will check your stool for blood and look for other causes of blood loss.  TREATMENT  Treatment varies depending on the cause of the anemia. Treatment can include:   Supplements of iron, vitamin B12, or folic acid.   Hormone medicines.   A blood transfusion. This may be needed if blood loss is severe.   Hospitalization. This may be needed if there is significant continual blood loss.   Dietary changes.  Spleen removal. HOME CARE INSTRUCTIONS Keep all follow-up appointments. It often takes many weeks to correct anemia,  and having your health care provider check on your condition and your response to treatment is very important. SEEK IMMEDIATE MEDICAL CARE IF:   You develop extreme weakness, shortness of breath, or chest pain.   You become dizzy or have trouble concentrating.  You develop heavy vaginal bleeding.   You develop a rash.   You  have bloody or black, tarry stools.   You faint.   You vomit up blood.   You vomit repeatedly.   You have abdominal pain.  You have a fever or persistent symptoms for more than 2-3 days.   You have a fever and your symptoms suddenly get worse.   You are dehydrated.  MAKE SURE YOU:  Understand these instructions.  Will watch your condition.  Will get help right away if you are not doing well or get worse.   This information is not intended to replace advice given to you by your health care provider. Make sure you discuss any questions you have with your health care provider.   Document Released: 02/28/2004 Document Revised: 09/22/2012 Document Reviewed: 07/16/2012 Elsevier Interactive Patient Education [employer]

## 2015-03-29 NOTE — Progress Notes (Signed)
Hourly rounding performed. Call light within reach. Pt in no acute distress. Denies needs. Visitor in bed with pt. Given grape juice per pt request. Refuses to wear SCDs at this time.

## 2015-03-30 LAB — CARDIOLIPIN ANTIBODIES, IGG, IGM, IGA
Anticardiolipin IgA: <9 APL U/mL (ref 0–11)
Anticardiolipin IgM: <9 MPL U/mL (ref 0–12)

## 2015-04-02 LAB — FACTOR 5 LEIDEN

## 2015-04-02 NOTE — Anesthesia Postprocedure Evaluation (Signed)
Anesthesia Post Note  Patient: Jessica Randall  Procedure(s) Performed: * No procedures listed *  Patient location during evaluation: Mother Baby Anesthesia Type: Epidural Level of consciousness: awake and alert Pain management: pain level controlled Vital Signs Assessment: post-procedure vital signs reviewed and stable Respiratory status: spontaneous breathing, nonlabored ventilation and respiratory function stable Cardiovascular status: stable Postop Assessment: no headache, no backache and epidural receding Anesthetic complications: no    Last Vitals:  Filed Vitals:   03/22/15 1946 03/23/15 0721  BP: 120/88 128/89  Pulse: 80 68  Temp: 36.6 C 36.7 C  Resp: 17 16    Last Pain:  Filed Vitals:   03/23/15 1136  PainSc: Asleep                 Yevette Edwards

## 2015-04-04 LAB — PROTHROMBIN GENE MUTATION

## 2016-11-12 ENCOUNTER — Ambulatory Visit (INDEPENDENT_AMBULATORY_CARE_PROVIDER_SITE_OTHER): Payer: Medicaid Other | Admitting: Maternal Newborn

## 2016-11-12 ENCOUNTER — Encounter: Payer: Self-pay | Admitting: Maternal Newborn

## 2016-11-12 VITALS — BP 102/58 | Ht 61.0 in | Wt 194.0 lb

## 2016-11-12 DIAGNOSIS — O0991 Supervision of high risk pregnancy, unspecified, first trimester: Secondary | ICD-10-CM

## 2016-11-12 NOTE — Assessment & Plan Note (Signed)
[facility]inic [facility] Prenatal Labs  Dating  Blood type:     Genetic Screen 1 Screen:    AFP:     Quad:     NIPS: Antibody:   Anatomic [place]  Rubella: @ Varicella:    GTT Early:               Third trimester:  RPR:     Rhogam  HBsAg:     TDaP vaccine                       Flu Shot: HIV:     Baby Food                                GBS:   Contraception  Pap:  CBB     CS/VBAC    Support Person

## 2016-11-12 NOTE — Progress Notes (Signed)
11/12/2016   Chief Complaint: Positive home pregnancy test, desires prenatal care.  Transfer of Care Patient: no  History of Present Illness: Jessica Randall is a 21 y.o. G3P2002 at [redacted]w[redacted]d based on last menstrual period 09/28/2016 (uncertain), with an Estimated Date of Delivery of 07/05/17, with the above CC. Pregnancy confirmed at [facility]. Unsure about exact LMP.   Her periods were: irregular periods She was using no method when she conceived.  She has Positive signs or symptoms of nausea/vomiting of pregnancy. She has Negative signs or symptoms of miscarriage or preterm labor She identifies Negative Zika risk factors for her and her partner On any different medications around the time she conceived/early pregnancy: No  History of varicella: No   ROS: A 12-point review of systems was performed and negative, except as stated in the above HPI.  OBGYN History: As per HPI. OB History  Gravida Para Term Preterm AB Living  0 0 2  SAB TAB Ectopic Multiple Live Births          2    # Outcome Date GA Lbr Len/2nd Weight Sex Delivery Anes PTL Lv  3 Current           2 Term 03/21/15 [redacted]w[redacted]d  6 lb 13 oz (3.09 kg) M Vag-Spont  N LIV  1 Term 01/24/12   7 lb (3.175 kg) M Vag-Spont  N LIV    Obstetric Comments  01/2012: 7lbs 3oz (Dr. Tiburcio Pea, [facility]) median episotomy; type of tear not documented.     Any issues with any prior pregnancies: yes, admission on PP day 6 after G2 in Feb. 2017 for hypertension and stroke-like symptoms, negative workup. Any prior children are healthy, doing well, without any problems or issues: yes History of pap smears: No. Last pap smear N/A. Patient wishes to defer Pap to postpartum. History of STIs: Yes, gonorrhea   Past Medical History: Past Medical History:  Diagnosis Date  . Anemia   . Anxiety and depression    history of SI  . History of drug use   . History of gonorrhea     Past Surgical History: Past Surgical History:  Procedure  Laterality Date  . NO PAST SURGERIES      Family History:  Family History  Problem Relation Age of Onset  . Lupus Mother   . Hypertension Mother   . Hyperthyroidism Mother   . Hyperlipidemia Mother   . Hypertension Maternal Grandmother   . Diabetes Maternal Grandmother   . Transient ischemic attack Maternal Grandmother   . Hyperlipidemia Maternal Grandfather   . Breast cancer Paternal Grandmother   . Transient ischemic attack Paternal Grandmother    She denies any female cancers, bleeding or blood clotting disorders.  She denies any history of intellectual disability, birth defects or genetic disorders in her or the FOB's history  Social History:  Social History   Social History  . Marital status: Single    Spouse name: N/A  . Number of children: N/A  . Years of education: N/A   Occupational History  . Not on file.   Social History Main Topics  . Smoking status: Never Smoker  . Smokeless tobacco: Never Used  . Alcohol use No     Comment: occ.   . Drug use: No     Comment: h/o THC use  . Sexual activity: Yes    Birth control/ protection: None   Other Topics Concern  . Not on file   Social History  Narrative  . No narrative on file   Any cats in the household: no   Allergy: No Known Allergies  Current Outpatient Medications:  Current Outpatient Prescriptions:  .  Prenatal Vit-Fe Fumarate-FA (PRENATAL MULTIVITAMIN) TABS tablet, Take 1 tablet by mouth daily at 12 noon. (Patient not taking: Reported on 11/12/2016), Disp: 30 tablet, Rfl: 6   Physical Exam:   BP (!) 102/58   Ht  (1.549 m)   Wt 194 lb (88 kg)   LMP 09/28/2016 (Approximate)   BMI 36.66 kg/m  Body mass index is 36.66 kg/m. Constitutional: Well nourished, well developed female in no acute distress.  Neck:  Supple, normal appearance, and no thyromegaly  Cardiovascular: S1, S2 normal, no murmur, rub or gallop, regular rate and rhythm Respiratory:  Clear to auscultation bilateral.  Normal respiratory effort Abdomen: positive bowel sounds and no masses, hernias; diffusely non tender to palpation, non distended Breasts: breasts appear normal, no suspicious masses, no skin or nipple changes or axillary nodes, patient declines to have breast exam. Neuro/Psych:  Normal mood and affect.  Skin:  Warm and dry.  Lymphatic:  No inguinal lymphadenopathy.   Pelvic exam: is not limited by body habitus External genitalia, Bartholin's glands, Urethra, Skene's glands: within normal limits Vagina: within normal limits and with no blood in the vault  Cervix: normal appearing cervix without discharge or lesions, closed/long/high Uterus:  normal contour Adnexa:  no mass, fullness, tenderness  Assessment: Jessica Randall is a 21 y.o. [id number] [redacted]w[redacted]d based on Patient's last menstrual period was 09/28/2016 (approximate). with an Estimated Date of Delivery: 07/05/17, presenting for prenatal care.  Plan:  1) Avoid alcoholic beverages. 2) Patient encouraged not to smoke.  3) Discontinue the use of all non-medicinal drugs and chemicals.  4) Take prenatal vitamins daily. Samples given and patient encouraged to call for Rx.  5) Seatbelt use advised. 6) Nutrition, food safety (fish, cheese advisories, and high nitrite foods) and exercise discussed. 7) Hospital and practice style delivering at [facility] discussed.  8) Patient is asked about travel to areas at risk for the Zika virus, and counseled to avoid travel and exposure to mosquitoes or sexual partners who may have themselves been exposed to the virus.  9) Childbirth classes at [facility] advised. 10) Genetic Screening, such as with 1st Trimester Screening, cell free fetal DNA, AFP testing, and Ultrasound, as well as with amniocentesis and CVS as appropriate, is discussed with patient. She plans to have genetic testing this pregnancy.  Problem list reviewed and updated.  Return in about 1 week (around 11/19/2016) for ROB following ultrasound.  Marcelyn Bruins, CNM [facility], [facility] 11/12/2016  5:12 PM

## 2016-11-13 LAB — RPR+RH+ABO+RUB AB+AB SCR+CB...
Antibody Screen: NEGATIVE
HEMOGLOBIN: 11.6 g/dL (ref 11.1–15.9)
HEP B S AG: NEGATIVE
HIV Screen 4th Generation wRfx: NONREACTIVE
Hematocrit: 34.9 % (ref 34.0–46.6)
MCH: 26 pg — AB (ref 26.6–33.0)
MCHC: 33.2 g/dL (ref 31.5–35.7)
MCV: 78 fL — ABNORMAL LOW (ref 79–97)
PLATELETS: 328 10*3/uL (ref 150–379)
RBC: 4.47 x10E6/uL (ref 3.77–5.28)
RDW: 14.2 % (ref 12.3–15.4)
RPR Ser Ql: NONREACTIVE
RUBELLA: 2.06 {index} (ref >0.99)
Rh Factor: POSITIVE
VARICELLA: 188 {index} (ref >165)
WBC: 10.1 10*3/uL (ref 3.4–10.8)

## 2016-11-14 LAB — URINE CULTURE

## 2016-11-14 LAB — GC/CHLAMYDIA PROBE AMP
Chlamydia trachomatis, NAA: NEGATIVE
NEISSERIA GONORRHOEAE BY PCR: NEGATIVE

## 2016-11-19 ENCOUNTER — Ambulatory Visit (INDEPENDENT_AMBULATORY_CARE_PROVIDER_SITE_OTHER): Payer: Medicaid Other | Admitting: Obstetrics and Gynecology

## 2016-11-19 ENCOUNTER — Ambulatory Visit (INDEPENDENT_AMBULATORY_CARE_PROVIDER_SITE_OTHER): Payer: Medicaid Other

## 2016-11-19 VITALS — BP 108/66 | Wt 196.0 lb

## 2016-11-19 DIAGNOSIS — O0991 Supervision of high risk pregnancy, unspecified, first trimester: Secondary | ICD-10-CM

## 2016-11-19 DIAGNOSIS — Z3A01 Less than 8 weeks gestation of pregnancy: Secondary | ICD-10-CM

## 2016-11-19 DIAGNOSIS — I159 Secondary hypertension, unspecified: Secondary | ICD-10-CM

## 2016-11-19 DIAGNOSIS — Z1379 Encounter for other screening for genetic and chromosomal anomalies: Secondary | ICD-10-CM

## 2016-11-19 DIAGNOSIS — Z362 Encounter for other antenatal screening follow-up: Secondary | ICD-10-CM

## 2016-11-19 DIAGNOSIS — G459 Transient cerebral ischemic attack, unspecified: Secondary | ICD-10-CM

## 2016-11-19 MED ORDER — DOXYLAMINE-PYRIDOXINE ER 20-20 MG PO TBCR: 1.0000 | EXTENDED_RELEASE_TABLET | Freq: Every day | ORAL | 4 refills | Status: DC

## 2016-11-19 NOTE — Progress Notes (Signed)
Dating scan  ROB

## 2016-11-20 LAB — COMPREHENSIVE METABOLIC PANEL
A/G RATIO: 1.3 (ref 1.2–2.2)
ALT: 45 IU/L — ABNORMAL HIGH (ref 0–32)
AST: 28 IU/L (ref 0–40)
Albumin: 3.9 g/dL (ref 3.5–5.5)
Alkaline Phosphatase: 70 IU/L (ref 39–117)
BILIRUBIN TOTAL: 0.3 mg/dL (ref 0.0–1.2)
BUN/Creatinine Ratio: 8 — ABNORMAL LOW (ref 9–23)
BUN: 6 mg/dL (ref 6–20)
CALCIUM: 9.3 mg/dL (ref 8.7–10.2)
CHLORIDE: 105 mmol/L (ref 96–106)
CO2: 20 mmol/L (ref 20–29)
Creatinine, Ser: 0.75 mg/dL (ref 0.57–1.00)
GFR calc Af Amer: 131 mL/min/{1.73_m2} (ref >59)
GFR calc non Af Amer: 114 mL/min/{1.73_m2} (ref >59)
GLUCOSE: 81 mg/dL (ref 65–99)
Globulin, Total: 3.1 g/dL (ref 1.5–4.5)
POTASSIUM: 3.6 mmol/L (ref 3.5–5.2)
Sodium: 136 mmol/L (ref 134–144)
Total Protein: 7 g/dL (ref 6.0–8.5)

## 2016-11-20 LAB — PROTEIN / CREATININE RATIO, URINE
Creatinine, Urine: 294 mg/dL
PROTEIN UR: 22.4 mg/dL
Protein/Creat Ratio: 76 mg/g creat (ref 0–200)

## 2016-11-20 NOTE — Progress Notes (Signed)
Dating based on ultrasound today, history of TIA like symptoms and elevated BP postpartum last pregnancy.  Baseline PIH labs obtained at today visit.  Discussed ASA after 13 weeks until 36 weeks.

## 2016-11-21 ENCOUNTER — Other Ambulatory Visit: Payer: Self-pay | Admitting: Advanced Practice Midwife

## 2016-11-21 DIAGNOSIS — O219 Vomiting of pregnancy, unspecified: Secondary | ICD-10-CM

## 2016-11-21 MED ORDER — ONDANSETRON 4 MG PO TBDP: 4.0000 mg | ORAL_TABLET | Freq: Three times a day (TID) | ORAL | 0 refills | Status: DC | PRN

## 2016-12-15 ENCOUNTER — Other Ambulatory Visit: Payer: Self-pay | Admitting: Obstetrics and Gynecology

## 2016-12-15 MED ORDER — DOXYLAMINE-PYRIDOXINE ER 20-20 MG PO TBCR: 1.0000 | EXTENDED_RELEASE_TABLET | Freq: Two times a day (BID) | ORAL | 0 refills | Status: DC

## 2016-12-17 ENCOUNTER — Other Ambulatory Visit: Payer: Medicaid Other

## 2016-12-17 ENCOUNTER — Encounter: Payer: Medicaid Other | Admitting: Obstetrics and Gynecology

## 2016-12-18 ENCOUNTER — Telehealth: Payer: Self-pay

## 2016-12-18 NOTE — Telephone Encounter (Signed)
I received PA form for Bonjesta. I lvm for patient to call me back. KJ CMA

## 2016-12-19 NOTE — Telephone Encounter (Signed)
Left another voicemail for patient to call me regarding the Rx for Bonjesta. KJ CMA

## 2016-12-24 ENCOUNTER — Encounter: Payer: Medicaid Other | Admitting: Advanced Practice Midwife

## 2016-12-24 ENCOUNTER — Other Ambulatory Visit: Payer: Medicaid Other

## 2016-12-24 ENCOUNTER — Ambulatory Visit (INDEPENDENT_AMBULATORY_CARE_PROVIDER_SITE_OTHER): Payer: Medicaid Other

## 2016-12-24 ENCOUNTER — Ambulatory Visit: Payer: Medicaid Other | Admitting: Advanced Practice Midwife

## 2016-12-24 DIAGNOSIS — Z1379 Encounter for other screening for genetic and chromosomal anomalies: Secondary | ICD-10-CM

## 2016-12-31 ENCOUNTER — Encounter: Payer: Medicaid Other | Admitting: Maternal Newborn

## 2017-01-02 ENCOUNTER — Encounter (INDEPENDENT_AMBULATORY_CARE_PROVIDER_SITE_OTHER): Payer: Medicaid Other | Admitting: Advanced Practice Midwife

## 2017-01-02 ENCOUNTER — Encounter: Payer: Self-pay | Admitting: Advanced Practice Midwife

## 2017-01-02 ENCOUNTER — Encounter: Payer: Medicaid Other | Admitting: Maternal Newborn

## 2017-01-02 VITALS — BP 110/60 | Wt 189.0 lb

## 2017-01-02 DIAGNOSIS — Z3A13 13 weeks gestation of pregnancy: Secondary | ICD-10-CM

## 2017-01-02 NOTE — Progress Notes (Signed)
No concerns.rj 

## 2017-01-02 NOTE — Progress Notes (Signed)
  Routine Prenatal Care Visit  Subjective  Jessica Randall is a 21 y.o. G3P2002 at 7142w3d being seen today for ongoing prenatal care.  She is currently monitored for the following issues for this high-risk pregnancy and has TIA (transient ischemic attack); Stroke-like symptoms; Hypertension; Hypokalemia; Anemia; Postpartum complication; and Supervision of high risk pregnancy, antepartum, first trimester on their problem list.  ----------------------------------------------------------------------------------- Patient reports no complaints.   Denies vaginal bleeding. Denies leaking of fluid. Denies contractions.  ----------------------------------------------------------------------------------- The following portions of the patient's history were reviewed and updated as appropriate: allergies, current medications, past family history, past medical history, past social history, past surgical history and problem list. Problem list updated.   Objective  Blood pressure 110/60, weight 189 lb (85.7 kg), last menstrual period 09/28/2016 Pregravid weight 184 lb (83.5 kg) Total Weight Gain 5 lb (2.268 kg) Urinalysis:      Fetal Status: positive fetal heart tones Unable to complete 1st trimester screen today due to timing of scan and blood work. Scan was done on 12/24/16.   General:  Alert, oriented and cooperative. Patient is in no acute distress.  Skin: Skin is warm and dry. No rash noted.   Cardiovascular: Normal heart rate noted  Respiratory: Normal respiratory effort, no problems with respiration noted  Abdomen: Soft, gravid, appropriate for gestational age.       Pelvic:  Cervical exam deferred        Extremities: Normal range of motion.     Mental Status: Normal mood and affect. Normal behavior. Normal judgment and thought content.   Assessment   21 y.o. [id number] at 6142w3d by  07/07/2017, by Ultrasound presenting for routine prenatal visit  Plan   pregnancy #3 Problems (from 11/12/16 to  present)    Problem Noted Resolved   Supervision of high risk pregnancy, antepartum, first trimester 11/12/2016 by Oswaldo ConroySchmid, Jacelyn Y, CNM No       Preterm labor symptoms and general obstetric precautions including but not limited to vaginal bleeding, contractions, leaking of fluid and fetal movement were reviewed in detail with the patient. Please refer to After Visit Summary for other counseling recommendations.   Return in about 4 weeks (around 01/30/2017) for rob.  Quad screen at [facility].  Tresea MallJane Aldo Sondgeroth, CNM  01/02/2017 4:39 PM

## 2017-01-02 NOTE — Patient Instructions (Signed)

## 2017-01-09 ENCOUNTER — Emergency Department
Admission: EM | Admit: 2017-01-09 | Discharge: 2017-01-09 | Disposition: A | Discharge status: Home / Self Care | Payer: Medicaid Other | Attending: Emergency Medicine | Admitting: Emergency Medicine

## 2017-01-09 ENCOUNTER — Emergency Department: Payer: Medicaid Other

## 2017-01-09 ENCOUNTER — Encounter: Payer: Self-pay | Admitting: Emergency Medicine

## 2017-01-09 DIAGNOSIS — I10 Essential (primary) hypertension: Secondary | ICD-10-CM | POA: Insufficient documentation

## 2017-01-09 DIAGNOSIS — Z8673 Personal history of transient ischemic attack (TIA), and cerebral infarction without residual deficits: Secondary | ICD-10-CM | POA: Insufficient documentation

## 2017-01-09 DIAGNOSIS — Y999 Unspecified external cause status: Secondary | ICD-10-CM | POA: Diagnosis not present

## 2017-01-09 DIAGNOSIS — Z3A14 14 weeks gestation of pregnancy: Secondary | ICD-10-CM | POA: Diagnosis not present

## 2017-01-09 DIAGNOSIS — Y929 Unspecified place or not applicable: Secondary | ICD-10-CM | POA: Diagnosis not present

## 2017-01-09 DIAGNOSIS — O9A212 Injury, poisoning and certain other consequences of external causes complicating pregnancy, second trimester: Secondary | ICD-10-CM | POA: Insufficient documentation

## 2017-01-09 DIAGNOSIS — H7292 Unspecified perforation of tympanic membrane, left ear: Secondary | ICD-10-CM

## 2017-01-09 DIAGNOSIS — S0922XA Traumatic rupture of left ear drum, initial encounter: Secondary | ICD-10-CM | POA: Insufficient documentation

## 2017-01-09 DIAGNOSIS — S098XXA Other specified injuries of head, initial encounter: Secondary | ICD-10-CM | POA: Diagnosis not present

## 2017-01-09 DIAGNOSIS — Y9389 Activity, other specified: Secondary | ICD-10-CM | POA: Insufficient documentation

## 2017-01-09 DIAGNOSIS — S0990XA Unspecified injury of head, initial encounter: Secondary | ICD-10-CM

## 2017-01-09 MED ORDER — ACETAMINOPHEN 500 MG PO TABS
1000.0000 mg | ORAL_TABLET | Freq: Once | ORAL | Status: AC
  Administered 2017-01-09: 1000 mg via ORAL
  Filled 2017-01-09: qty 2

## 2017-01-09 MED ORDER — CIPROFLOXACIN-DEXAMETHASONE 0.3-0.1 % OT SUSP
4.0000 [drp] | Freq: Once | OTIC | Status: AC
  Administered 2017-01-09: 4 [drp] via OTIC
  Filled 2017-01-09: qty 7.5
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
## 2017-01-09 NOTE — ED Notes (Signed)
[employer] officer notified regarding patient's situation.

## 2017-01-28 ENCOUNTER — Telehealth: Payer: Self-pay

## 2017-01-28 NOTE — Telephone Encounter (Signed)
Pt called after hour nurse this am and was told to go to the ED which pt did not do.  LMTC.

## 2017-01-28 NOTE — Telephone Encounter (Signed)
Pt did not go to ED this am d/t no transportation.  Asked if she could still go or could she be seen; adv we have no openings this pm but can see her in the am.  Pt states she has appt Fri and will go to ED this pm.

## 2017-01-30 ENCOUNTER — Ambulatory Visit (INDEPENDENT_AMBULATORY_CARE_PROVIDER_SITE_OTHER): Payer: Medicaid Other | Admitting: Maternal Newborn

## 2017-01-30 ENCOUNTER — Encounter: Payer: Self-pay | Admitting: Maternal Newborn

## 2017-01-30 VITALS — BP 102/54 | Wt 188.0 lb

## 2017-01-30 DIAGNOSIS — O99891 Other specified diseases and conditions complicating pregnancy: Secondary | ICD-10-CM

## 2017-01-30 DIAGNOSIS — Z3689 Encounter for other specified antenatal screening: Secondary | ICD-10-CM

## 2017-01-30 DIAGNOSIS — Z8673 Personal history of transient ischemic attack (TIA), and cerebral infarction without residual deficits: Secondary | ICD-10-CM

## 2017-01-30 DIAGNOSIS — O9989 Other specified diseases and conditions complicating pregnancy, childbirth and the puerperium: Secondary | ICD-10-CM

## 2017-01-30 DIAGNOSIS — O0991 Supervision of high risk pregnancy, unspecified, first trimester: Secondary | ICD-10-CM

## 2017-01-30 DIAGNOSIS — M7918 Myalgia, other site: Secondary | ICD-10-CM

## 2017-01-30 HISTORY — DX: Other specified diseases and conditions complicating pregnancy: O99.891

## 2017-01-30 MED ORDER — ASPIRIN EC 81 MG PO TBEC: 81.0000 mg | DELAYED_RELEASE_TABLET | Freq: Every day | ORAL | 2 refills | Status: DC

## 2017-01-30 NOTE — Progress Notes (Signed)
    Routine Prenatal Care Visit  Subjective  Jessica StampsDalisha Britten is a 21 y.o. G3P2002 at 3468w3d being seen today for ongoing prenatal care.  She is currently monitored for the following issues for this high-risk pregnancy and has TIA (transient ischemic attack); Stroke-like symptoms; Hypertension; Hypokalemia; Anemia; Postpartum complication; Supervision of high risk pregnancy, antepartum, first trimester; and Pain in symphysis pubis during pregnancy on their problem list.  ----------------------------------------------------------------------------------- Patient reports pain in lower abdomen, standing/walking painful.   Contractions: Not present. Vag. Bleeding: None. Denies leaking of fluid.  ----------------------------------------------------------------------------------- The following portions of the patient's history were reviewed and updated as appropriate: allergies, current medications, past family history, past medical history, past social history, past surgical history and problem list. Problem list updated.   Objective  Blood pressure (!) 102/54, weight 188 lb (85.3 kg), last menstrual period 09/28/2016, currently breastfeeding. Pregravid weight 184 lb (83.5 kg) Total Weight Gain 4 lb (1.814 kg) Urinalysis: Urine Protein: Negative Urine Glucose: Negative  Fetal Status: Fetal Heart Rate (bpm): 155         General:  Alert, oriented and cooperative. Patient is in no acute distress.  Skin: Skin is warm and dry. No rash noted.   Cardiovascular: Normal heart rate noted  Respiratory: Normal respiratory effort, no problems with respiration noted  Abdomen: Soft, gravid, appropriate for gestational age. Pain/Pressure: Present     Pelvic:  Cervical exam deferred        Extremities: Normal range of motion.  Edema: None  Mental Status: Normal mood and affect. Normal behavior. Normal judgment and thought content.     Assessment   21 y.o. [id number] at 9768w3d, EDD 07/07/2017, by Ultrasound  presenting for routine prenatal visit.  Plan   pregnancy #3 Problems (from 11/12/16 to present)    Problem Noted Resolved   Supervision of high risk pregnancy, antepartum, first trimester 11/12/2016 by Oswaldo ConroySchmid, Jacelyn Y, CNM No   Overview Signed 01/30/2017  3:34 PM by Oswaldo ConroySchmid, Jacelyn Y, CNM    Clinic [facility] Prenatal Labs  Dating  Blood type: A/Positive/-- (10/10 1526)   Genetic Screen 1 Screen:    AFP:     Quad:     NIPS: Antibody:Negative (10/10 1526)  Anatomic US  Rubella: 2.06 (10/10 1526) Varicella:    GTT Early:               Third trimester:  RPR: Non Reactive (10/10 1526)   Rhogam  HBsAg: Negative (10/10 1526)   TDaP vaccine                       Flu Shot: HIV: Non Reactive (10/10 1526)   Baby Food                                GBS:   Contraception  Pap:  CBB     CS/VBAC    Support Person               Discussed comfort measures for symphisis pubis pain such as support belt and gentle stretches, avoiding stair climbing, etc.  Anatomy ultrasound next visit.  Preterm labor symptoms and general obstetric precautions including but not limited to vaginal bleeding, contractions, leaking of fluid and fetal movement were reviewed in detail with the patient.  Return in about 3 weeks (around 02/20/2017) for ROB and anatomy scan.  Marcelyn BruinsJacelyn Schmid, CNM 01/30/2017  3:38 PM

## 2017-02-20 ENCOUNTER — Encounter: Payer: Self-pay | Admitting: Advanced Practice Midwife

## 2017-02-20 ENCOUNTER — Ambulatory Visit (INDEPENDENT_AMBULATORY_CARE_PROVIDER_SITE_OTHER): Payer: Medicaid Other | Admitting: Advanced Practice Midwife

## 2017-02-20 ENCOUNTER — Ambulatory Visit (INDEPENDENT_AMBULATORY_CARE_PROVIDER_SITE_OTHER): Payer: Medicaid Other

## 2017-02-20 VITALS — BP 110/64 | Wt 184.0 lb

## 2017-02-20 DIAGNOSIS — O0992 Supervision of high risk pregnancy, unspecified, second trimester: Secondary | ICD-10-CM | POA: Diagnosis not present

## 2017-02-20 DIAGNOSIS — Z3689 Encounter for other specified antenatal screening: Secondary | ICD-10-CM | POA: Diagnosis not present

## 2017-02-20 DIAGNOSIS — L309 Dermatitis, unspecified: Secondary | ICD-10-CM

## 2017-02-20 DIAGNOSIS — Z0489 Encounter for examination and observation for other specified reasons: Secondary | ICD-10-CM

## 2017-02-20 DIAGNOSIS — Z3A20 20 weeks gestation of pregnancy: Secondary | ICD-10-CM

## 2017-02-20 DIAGNOSIS — IMO0002 Reserved for concepts with insufficient information to code with codable children: Secondary | ICD-10-CM

## 2017-02-20 DIAGNOSIS — O0991 Supervision of high risk pregnancy, unspecified, first trimester: Secondary | ICD-10-CM

## 2017-02-20 MED ORDER — HYDROCORTISONE 1 % RE CREA: TOPICAL_CREAM | RECTAL | 0 refills | Status: DC

## 2017-02-20 NOTE — Progress Notes (Signed)
Routine Prenatal Care Visit  Subjective  Jessica Randall is a 22 y.o. G3P2002 at 614w3d being seen today for ongoing prenatal care.  She is currently monitored for the following issues for this high-risk pregnancy and has TIA (transient ischemic attack); Stroke-like symptoms; Hypertension; Hypokalemia; Anemia; Postpartum complication; Supervision of high risk pregnancy, antepartum, first trimester; and Pain in symphysis pubis during pregnancy on their problem list.  ----------------------------------------------------------------------------------- Patient reports eczema. She is requesting prescription for hydrocortisone cream.   Contractions: Not present. Vag. Bleeding: None.  Movement: Present. Denies leaking of fluid.  ----------------------------------------------------------------------------------- The following portions of the patient's history were reviewed and updated as appropriate: allergies, current medications, past family history, past medical history, past social history, past surgical history and problem list. Problem list updated.   Objective  Blood pressure 110/64, weight 184 lb (83.5 kg), last menstrual period 09/28/2016, currently breastfeeding. Pregravid weight 184 lb (83.5 kg) Total Weight Gain 0 lb (0 kg) Urinalysis:      Fetal Status: Fetal Heart Rate (bpm): 130   Movement: Present     Anatomy scan today is incomplete for nose/lips. A cardiac arrhythmia was noted on ultrasound and on doppler.   General:  Alert, oriented and cooperative. Patient is in no acute distress.  Skin: Skin is warm and dry. No rash noted.   Cardiovascular: Normal heart rate noted  Respiratory: Normal respiratory effort, no problems with respiration noted  Abdomen: Soft, gravid, appropriate for gestational age. Pain/Pressure: Absent     Pelvic:  Cervical exam deferred        Extremities: Normal range of motion.  Edema: None  Mental Status: Normal mood and affect. Normal behavior. Normal  judgment and thought content.   Assessment   22 y.o. G3P2002 at 584w3d by  07/07/2017, by Ultrasound presenting for routine prenatal visit  Plan   pregnancy #3 Problems (from 11/12/16 to present)    Problem Noted Resolved   Supervision of high risk pregnancy, antepartum, first trimester 11/12/2016 by Oswaldo ConroySchmid, Jacelyn Y, CNM No   Overview Signed 01/30/2017  3:34 PM by Oswaldo ConroySchmid, Jacelyn Y, CNM    Clinic [facility] Prenatal Labs  Dating  Blood type: A/Positive/-- (10/10 1526)   Genetic Screen 1 Screen:    AFP:     Quad:     NIPS: Antibody:Negative (10/10 1526)  Anatomic US  Rubella: 2.06 (10/10 1526) Varicella:    GTT Early:               Third trimester:  RPR: Non Reactive (10/10 1526)   Rhogam  HBsAg: Negative (10/10 1526)   TDaP vaccine                       Flu Shot: HIV: Non Reactive (10/10 1526)   Baby Food                                GBS:   Contraception  Pap:  CBB     CS/VBAC    Support Person                  Preterm labor symptoms and general obstetric precautions including but not limited to vaginal bleeding, contractions, leaking of fluid and fetal movement were reviewed in detail with the patient. [facility] and incomplete views of nose/lips  Return in about 4 weeks (around 03/20/2017) for rob.  Jessica Randall, CNM  02/20/2017  12:00 PM

## 2017-02-20 NOTE — Progress Notes (Signed)
No vb. No lof. Anatomy scan today. 

## 2017-02-23 ENCOUNTER — Other Ambulatory Visit: Payer: Self-pay | Admitting: Advanced Practice Midwife

## 2017-02-23 DIAGNOSIS — Z0489 Encounter for examination and observation for other specified reasons: Secondary | ICD-10-CM

## 2017-02-23 DIAGNOSIS — IMO0002 Reserved for concepts with insufficient information to code with codable children: Secondary | ICD-10-CM

## 2017-02-23 DIAGNOSIS — O0991 Supervision of high risk pregnancy, unspecified, first trimester: Secondary | ICD-10-CM

## 2017-03-02 ENCOUNTER — Ambulatory Visit (HOSPITAL_BASED_OUTPATIENT_CLINIC_OR_DEPARTMENT_OTHER)
Admission: RE | Admit: 2017-03-02 | Discharge: 2017-03-02 | Disposition: A | Discharge status: Home / Self Care | Payer: Medicaid Other | Source: Ambulatory Visit | Attending: Advanced Practice Midwife | Admitting: Advanced Practice Midwife

## 2017-03-02 ENCOUNTER — Ambulatory Visit
Admission: RE | Admit: 2017-03-02 | Discharge: 2017-03-02 | Disposition: A | Discharge status: Home / Self Care | Payer: Medicaid Other | Source: Ambulatory Visit | Attending: Obstetrics and Gynecology | Admitting: Obstetrics and Gynecology

## 2017-03-02 DIAGNOSIS — Z0489 Encounter for examination and observation for other specified reasons: Secondary | ICD-10-CM

## 2017-03-02 DIAGNOSIS — IMO0002 Reserved for concepts with insufficient information to code with codable children: Secondary | ICD-10-CM

## 2017-03-02 DIAGNOSIS — O99212 Obesity complicating pregnancy, second trimester: Secondary | ICD-10-CM | POA: Insufficient documentation

## 2017-03-02 DIAGNOSIS — O368320 Maternal care for abnormalities of the fetal heart rate or rhythm, second trimester, not applicable or unspecified: Secondary | ICD-10-CM | POA: Diagnosis present

## 2017-03-02 DIAGNOSIS — O09292 Supervision of pregnancy with other poor reproductive or obstetric history, second trimester: Secondary | ICD-10-CM | POA: Insufficient documentation

## 2017-03-02 DIAGNOSIS — O368390 Maternal care for abnormalities of the fetal heart rate or rhythm, unspecified trimester, not applicable or unspecified: Secondary | ICD-10-CM

## 2017-03-02 DIAGNOSIS — Z3A21 21 weeks gestation of pregnancy: Secondary | ICD-10-CM | POA: Insufficient documentation

## 2017-03-02 DIAGNOSIS — E669 Obesity, unspecified: Secondary | ICD-10-CM | POA: Diagnosis not present

## 2017-03-02 DIAGNOSIS — O0991 Supervision of high risk pregnancy, unspecified, first trimester: Secondary | ICD-10-CM

## 2017-03-02 NOTE — Progress Notes (Signed)
[facility] Consultation   Chief Complaint: fetal arrhythmia noted on prior ultrasound  HPI: [name] is a 22 y.o. G3P2002 at 3872w6d by 3064w1d US who presents in [facility] for further evaluation and recommendations regarding fetal arrhythmia noted on ultrasound  Past Medical History: Patient  has a past medical history of Anemia, Anxiety and depression, History of drug use, and History of gonorrhea. History of postpartum preeclampsia (never received magnesium sulfate). Past Surgical History: She has no past surgical history Obstetric History:  OB History    Gravida Para Term Preterm AB Living   3 2 2  0 0 2   SAB TAB Ectopic Multiple Live Births           2      Obstetric Comments   01/2012: 7lbs 3oz (Dr. Tiburcio PeaHarris, [facility]) median episotomy; type of tear not documented.      Gynecologic History:  Patient's last menstrual period was 09/28/2016 (approximate).   Medications: none (denies use of baby ASA which was previously recommended); not taking PNVs. Allergies: Patient has No Known Allergies.  Social History: Patient  reports that  has never smoked. she has never used smokeless tobacco. She reports that she does not drink alcohol or use drugs.  Family History: family history includes Breast cancer in her paternal grandmother; Diabetes in her maternal grandmother; Hyperlipidemia in her maternal grandfather and mother; Hypertension in her maternal grandmother and mother; Hyperthyroidism in her mother; Lupus in her mother; Transient ischemic attack in her maternal grandmother and paternal grandmother.  Review of Systems A full 12 point review of systems was negative or as noted in the History of Present Illness.  Physical Exam: LMP 09/28/2016 (Approximate)   BP 111/79 P 85 Temp 98.1  Asessement: 21yo G3P2002 with a history of  1.  preeclampsia (postpartum). 2.  Fetal arrhythmia 3.  Obesity  Plan: 1.  Preeclampsia (postpartum).   [name] presented 6 days post partum to the ED with symptoms of right sided weakness and elevated BPs in 2017.  Her symptoms spontaneously resolved during her evaluation.  She underwent an echocardiogram, MRA/MRV of the brain and carotid duplex which were all negative.  She had a full thrombophilia panel which was also negative.  Given the elevated BPs, her symptoms were attributed to preeclampsia rather than a TIA and she was d/ced from the ED on baby ASA.  She was not admitted and never received magnesium sulfate.  She has baseline preX labs sent at her initial prenatal visit which was notable for a mildly elevated ALT (45 - ULN 32).  Other preX labs were normal (baseline p/c 76).  She was advised to start a baby ASA after the first trimester which she hasn't.  Recommend a repeat of labs given mildly elevated ALT in the first trimester.    2.  Fetal arrhythmia.  Rare premature atrial contractions noted on US.  An irregular fetal heart rate is a common complication occuring in up to 10% of all pregnancies with persistent arrhythmias occuring in 1-2% of pregnancies. Isolated premature atrial beats are the most common type of fetal arrhythmias and most likely culprit in this case. In general isolated premature atrial contractions are a benign finding that will resolve spontaneously either during the pregnancy or in the first months of postnatal life. Despite this, there is a small risk of PAC's triggering a supraventricular tachycardia in a fetus with an underlying substrate for SVT. This is seen in approximately 0.5-2% of patients  with fetal PAC's.   The patient was instructed to avoid caffeine, tobacco and alcohol to minimize the risk of further extrasystolic beats. She was instructed to seek medical attention to assess for presence of tachycardia if she notices decreased fetal movement.   Given the infrequency of the arrhthymias, recommend weekly auscultation to evaluate fetal heart rate.  If it  becomes more frequent, recommend fetal echocardiogram and increasing auscultation to 2x weekly.  3.  Obesity (BMI 34).  Consider early glucola (or finger stick).  Consider third trimester [place] for growth.  Limit weight gain and encourage healthy eating habits.     Total time spent with the patient was 30 minutes with greater than 50% spent in counseling and coordination of care. We appreciate this interesting consult and will be happy to be involved in the ongoing care of [name] in anyway her obstetricians desire.  Kirby Funk, MD Maternal-Fetal Medicine [facility]

## 2017-03-05 ENCOUNTER — Telehealth: Payer: Self-pay

## 2017-03-05 ENCOUNTER — Encounter: Payer: Self-pay | Admitting: Advanced Practice Midwife

## 2017-03-05 NOTE — Telephone Encounter (Signed)
Pt states she fell down the stairs 2 days ago & has been having lower abdominal pain cramping since. Apt scheduled for 3:10 today w/[facility] for eval.

## 2017-03-13 ENCOUNTER — Other Ambulatory Visit: Payer: Self-pay | Admitting: Obstetrics and Gynecology

## 2017-03-13 DIAGNOSIS — O0902 Supervision of pregnancy with history of infertility, second trimester: Secondary | ICD-10-CM

## 2017-03-20 ENCOUNTER — Ambulatory Visit: Payer: Medicaid Other | Admitting: Obstetrics and Gynecology

## 2017-03-20 ENCOUNTER — Ambulatory Visit: Payer: Medicaid Other

## 2017-03-24 ENCOUNTER — Ambulatory Visit (INDEPENDENT_AMBULATORY_CARE_PROVIDER_SITE_OTHER): Payer: Medicaid Other

## 2017-03-24 ENCOUNTER — Ambulatory Visit (INDEPENDENT_AMBULATORY_CARE_PROVIDER_SITE_OTHER): Payer: Medicaid Other | Admitting: Obstetrics and Gynecology

## 2017-03-24 ENCOUNTER — Encounter: Payer: Self-pay | Admitting: Obstetrics and Gynecology

## 2017-03-24 VITALS — BP 118/74 | Wt 182.0 lb

## 2017-03-24 DIAGNOSIS — O0902 Supervision of pregnancy with history of infertility, second trimester: Secondary | ICD-10-CM

## 2017-03-24 DIAGNOSIS — Z3A25 25 weeks gestation of pregnancy: Secondary | ICD-10-CM

## 2017-03-24 DIAGNOSIS — O0991 Supervision of high risk pregnancy, unspecified, first trimester: Secondary | ICD-10-CM

## 2017-03-24 NOTE — Progress Notes (Signed)
  Routine Prenatal Care Visit  Subjective  Jessica StampsDalisha Mccarrell is a 22 y.o. G3P2002 at 2567w0d being seen today for ongoing prenatal care.  She is currently monitored for the following issues for this high-risk pregnancy and has TIA (transient ischemic attack); Stroke-like symptoms; Hypertension; Hypokalemia; Anemia; Postpartum complication; Supervision of high risk pregnancy, antepartum, first trimester; and Pain in symphysis pubis during pregnancy on their problem list.  ----------------------------------------------------------------------------------- Patient reports no complaints.   Contractions: Not present. Vag. Bleeding: None.  Movement: Present. Denies leaking of fluid.  U/s today without fetal arrhythmia.   ----------------------------------------------------------------------------------- The following portions of the patient's history were reviewed and updated as appropriate: allergies, current medications, past family history, past medical history, past social history, past surgical history and problem list. Problem list updated.   Objective  Blood pressure 118/74, weight 182 lb (82.6 kg), last menstrual period 09/28/2016, currently breastfeeding. Pregravid weight 184 lb (83.5 kg) Total Weight Gain  (-0.907 kg) Urinalysis: Urine Protein: Negative Urine Glucose: Negative  Fetal Status: Fetal Heart Rate (bpm): 150   Movement: Present     General:  Alert, oriented and cooperative. Patient is in no acute distress.  Skin: Skin is warm and dry. No rash noted.   Cardiovascular: Normal heart rate noted  Respiratory: Normal respiratory effort, no problems with respiration noted  Abdomen: Soft, gravid, appropriate for gestational age. Pain/Pressure: Absent     Pelvic:  Cervical exam deferred        Extremities: Normal range of motion.     Mental Status: Normal mood and affect. Normal behavior. Normal judgment and thought content.   Assessment   22 y.o. [id number] at 7867w0d by  07/07/2017, by  Ultrasound presenting for routine prenatal visit  Plan   pregnancy #3 Problems (from 11/12/16 to present)    Problem Noted Resolved   Supervision of high risk pregnancy, antepartum, first trimester 11/12/2016 by Oswaldo ConroySchmid, Jacelyn Y, CNM No   Overview Signed 01/30/2017  3:34 PM by Oswaldo ConroySchmid, Jacelyn Y, CNM    [facility]inic [facility] Prenatal Labs  Dating  Blood type: A/Positive/-- (10/10 1526)   Genetic Screen 1 Screen:    AFP:     Quad:     NIPS: Antibody:Negative (10/10 1526)  Anatomic US  Rubella: 2.06 (10/10 1526) Varicella:    GTT Early:               Third trimester:  RPR: Non Reactive (10/10 1526)   Rhogam  HBsAg: Negative (10/10 1526)   TDaP vaccine                       Flu Shot: HIV: Non Reactive (10/10 1526)   Baby Food                                GBS:   Contraception  Pap:  CBB     CS/VBAC    Support Person                  Preterm labor symptoms and general obstetric precautions including but not limited to vaginal bleeding, contractions, leaking of fluid and fetal movement were reviewed in detail with the patient. Please refer to After Visit Summary for other counseling recommendations.   Return in about 1 week (around 03/31/2017) for Routine Prenatal Appointment (fetal doppler).  Weekly dopplers per [facility].   Thomasene MohairStephen Patric Vanpelt, MD  03/24/2017 2:38 PM

## 2017-03-31 ENCOUNTER — Encounter: Payer: Medicaid Other | Admitting: Maternal Newborn

## 2017-04-01 ENCOUNTER — Encounter: Payer: Medicaid Other | Admitting: Obstetrics & Gynecology

## 2017-04-03 ENCOUNTER — Ambulatory Visit (INDEPENDENT_AMBULATORY_CARE_PROVIDER_SITE_OTHER): Payer: Medicaid Other | Admitting: Advanced Practice Midwife

## 2017-04-03 VITALS — BP 110/60 | Wt 178.0 lb

## 2017-04-03 DIAGNOSIS — Z113 Encounter for screening for infections with a predominantly sexual mode of transmission: Secondary | ICD-10-CM

## 2017-04-03 DIAGNOSIS — Z130 Encounter for screening for diseases of the blood and blood-forming organs and certain disorders involving the immune mechanism: Secondary | ICD-10-CM

## 2017-04-03 DIAGNOSIS — Z3A26 26 weeks gestation of pregnancy: Secondary | ICD-10-CM | POA: Diagnosis not present

## 2017-04-03 DIAGNOSIS — L309 Dermatitis, unspecified: Secondary | ICD-10-CM

## 2017-04-03 DIAGNOSIS — Z131 Encounter for screening for diabetes mellitus: Secondary | ICD-10-CM

## 2017-04-03 NOTE — Progress Notes (Signed)
Routine Prenatal Care Visit  Subjective  Jessica Randall is a 23 y.o. G3P2002 at [redacted]w[redacted]d being seen today for ongoing prenatal care.  She is currently monitored for the following issues for this high-risk pregnancy and has TIA (transient ischemic attack); Stroke-like symptoms; Hypertension; Hypokalemia; Anemia; Postpartum complication; Supervision of high risk pregnancy, antepartum, first trimester; and Pain in symphysis pubis during pregnancy on their problem list.  ----------------------------------------------------------------------------------- Patient reports vaginal odor since yesterday. She admits intercourse the night before she noticed the odor. She also has residual right hip pain from her TIA and is wondering who she should see about that.  Contractions: Not present. Vag. Bleeding: None.  Movement: Present. Denies leaking of fluid.  ----------------------------------------------------------------------------------- The following portions of the patient's history were reviewed and updated as appropriate: allergies, current medications, past family history, past medical history, past social history, past surgical history and problem list. Problem list updated.   Objective  Blood pressure 110/60, weight 178 lb (80.7 kg), last menstrual period 09/28/2016, currently breastfeeding. Pregravid weight 184 lb (83.5 kg) Total Weight Gain  (-2.722 kg) Urinalysis: Urine Protein: Negative Urine Glucose: Negative  Fetal Status: Fetal Heart Rate (bpm): 148 Fundal Height: 26 cm Movement: Present     General:  Alert, oriented and cooperative. Patient is in no acute distress.  Skin: Skin is warm and dry. No rash noted.   Cardiovascular: Normal heart rate noted  Respiratory: Normal respiratory effort, no problems with respiration noted  Abdomen: Soft, gravid, appropriate for gestational age. Pain/Pressure: Present     Pelvic:  swab done for wet prep: negative for clue cells, whiff, yeast          Extremities: Normal range of motion.     Mental Status: Normal mood and affect. Normal behavior. Normal judgment and thought content.   Assessment   22 y.o. G3P2002 at [redacted]w[redacted]d by  07/07/2017, by Ultrasound presenting for routine prenatal visit  Plan   pregnancy #3 Problems (from 11/12/16 to present)    Problem Noted Resolved   Supervision of high risk pregnancy, antepartum, first trimester 11/12/2016 by Oswaldo Conroy, CNM No   Overview Signed 01/30/2017  3:34 PM by Oswaldo Conroy, CNM    [facility]inic [facility] Prenatal Labs  Dating  Blood type: A/Positive/-- (10/10 1526)   Genetic Screen 1 Screen:    AFP:     Quad:     NIPS: Antibody:Negative (10/10 1526)  Anatomic [place]  Rubella: 2.06 (10/10 1526) Varicella:    GTT Early:               Third trimester:  RPR: Non Reactive (10/10 1526)   Rhogam  HBsAg: Negative (10/10 1526)   TDaP vaccine                       Flu Shot: HIV: Non Reactive (10/10 1526)   Baby Food                                GBS:   Contraception  Pap:  CBB     CS/VBAC    Support Person Mom and friend                 Preterm labor symptoms and general obstetric precautions including but not limited to vaginal bleeding, contractions, leaking of fluid and fetal movement were reviewed in detail with the patient. Recommend physical therapy for leg/hip pain.   Return in about  2 weeks (around 04/17/2017) for 28 week labs and rob.  Tresea MallJane Wendle Kina, CNM 04/03/2017 2:10 PM

## 2017-04-06 ENCOUNTER — Telehealth: Payer: Self-pay

## 2017-04-06 NOTE — Telephone Encounter (Signed)
Pt states the eczema cream that JEG sent isn't helping. [facility] ZO#[phone]

## 2017-04-07 ENCOUNTER — Other Ambulatory Visit: Payer: Self-pay | Admitting: Advanced Practice Midwife

## 2017-04-07 MED ORDER — CLOBETASOL PROPIONATE 0.05 % EX CREA: 1.0000 "application " | TOPICAL_CREAM | Freq: Two times a day (BID) | CUTANEOUS | 2 refills | Status: DC

## 2017-04-07 NOTE — Telephone Encounter (Signed)
Spoke with patient and suggested sea salt bath today and to call to get on schedule to check for BV with NuSwab. Wet prep was negative at last visit. She complains of continued odor and now discharge.

## 2017-04-07 NOTE — Addendum Note (Signed)
Addended by: Tresea MallGLEDHILL, Mayfield Schoene on: 04/07/2017 09:31 AM   Modules accepted: Orders

## 2017-04-07 NOTE — Progress Notes (Signed)
Rx Clobetasol cream sent to patient pharmacy.

## 2017-04-07 NOTE — Telephone Encounter (Signed)
Rx clobetasol sent to patient pharmacy. Please let her know. Thank you

## 2017-04-07 NOTE — Telephone Encounter (Signed)
Pt aware. Requesting that JEG call her back when she can. Wanting to discuss "something going on with her vagina" would not give me details.

## 2017-04-16 ENCOUNTER — Encounter: Payer: Medicaid Other | Admitting: Advanced Practice Midwife

## 2017-04-16 ENCOUNTER — Other Ambulatory Visit: Payer: Medicaid Other

## 2017-05-01 ENCOUNTER — Encounter: Payer: Self-pay | Admitting: Advanced Practice Midwife

## 2017-05-01 ENCOUNTER — Ambulatory Visit (INDEPENDENT_AMBULATORY_CARE_PROVIDER_SITE_OTHER): Payer: Medicaid Other | Admitting: Advanced Practice Midwife

## 2017-05-01 ENCOUNTER — Other Ambulatory Visit: Payer: Medicaid Other

## 2017-05-01 VITALS — BP 112/64 | Wt 174.0 lb

## 2017-05-01 DIAGNOSIS — Z113 Encounter for screening for infections with a predominantly sexual mode of transmission: Secondary | ICD-10-CM

## 2017-05-01 DIAGNOSIS — Z3A30 30 weeks gestation of pregnancy: Secondary | ICD-10-CM

## 2017-05-01 DIAGNOSIS — L309 Dermatitis, unspecified: Secondary | ICD-10-CM

## 2017-05-01 DIAGNOSIS — Z131 Encounter for screening for diabetes mellitus: Secondary | ICD-10-CM

## 2017-05-01 DIAGNOSIS — Z130 Encounter for screening for diseases of the blood and blood-forming organs and certain disorders involving the immune mechanism: Secondary | ICD-10-CM

## 2017-05-01 MED ORDER — CLOBETASOL PROPIONATE 0.05 % EX CREA: 1.0000 "application " | TOPICAL_CREAM | Freq: Two times a day (BID) | CUTANEOUS | 2 refills | Status: DC

## 2017-05-01 NOTE — Progress Notes (Signed)
28 wk labs today. No c/o's. No specimen given today.

## 2017-05-01 NOTE — Patient Instructions (Signed)
Third Trimester of Pregnancy The third trimester is from week 28 through week 40 (months 7 through 9). The third trimester is a time when the unborn baby (fetus) is growing rapidly. At the end of the ninth month, the fetus is about 20 inches in length and weighs 6-10 pounds. Body changes during your third trimester Your body will continue to go through many changes during pregnancy. The changes vary from woman to woman. During the third trimester:  Your weight will continue to increase. You can expect to gain 25-35 pounds (11-16 kg) by the end of the pregnancy.  You may begin to get stretch marks on your hips, abdomen, and breasts.  You may urinate more often because the fetus is moving lower into your pelvis and pressing on your bladder.  You may develop or continue to have heartburn. This is caused by increased hormones that slow down muscles in the digestive tract.  You may develop or continue to have constipation because increased hormones slow digestion and cause the muscles that push waste through your intestines to relax.  You may develop hemorrhoids. These are swollen veins (varicose veins) in the rectum that can itch or be painful.  You may develop swollen, bulging veins (varicose veins) in your legs.  You may have increased body aches in the pelvis, back, or thighs. This is due to weight gain and increased hormones that are relaxing your joints.  You may have changes in your hair. These can include thickening of your hair, rapid growth, and changes in texture. Some women also have hair loss during or after pregnancy, or hair that feels dry or thin. Your hair will most likely return to normal after your baby is born.  Your breasts will continue to grow and they will continue to become tender. A yellow fluid (colostrum) may leak from your breasts. This is the first milk you are producing for your baby.  Your belly button may stick out.  You may notice more swelling in your hands,  face, or ankles.  You may have increased tingling or numbness in your hands, arms, and legs. The skin on your belly may also feel numb.  You may feel short of breath because of your expanding uterus.  You may have more problems sleeping. This can be caused by the size of your belly, increased need to urinate, and an increase in your body's metabolism.  You may notice the fetus "dropping," or moving lower in your abdomen (lightening).  You may have increased vaginal discharge.  You may notice your joints feel loose and you may have pain around your pelvic bone.  What to expect at prenatal visits You will have prenatal exams every 2 weeks until week 36. Then you will have weekly prenatal exams. During a routine prenatal visit:  You will be weighed to make sure you and the baby are growing normally.  Your blood pressure will be taken.  Your abdomen will be measured to track your baby's growth.  The fetal heartbeat will be listened to.  Any test results from the previous visit will be discussed.  You may have a cervical check near your due date to see if your cervix has softened or thinned (effaced).  You will be tested for Group B streptococcus. This happens between 35 and 37 weeks.  Your health care provider may ask you:  What your birth plan is.  How you are feeling.  If you are feeling the baby move.  If you have had   any abnormal symptoms, such as leaking fluid, bleeding, severe headaches, or abdominal cramping.  If you are using any tobacco products, including cigarettes, chewing tobacco, and electronic cigarettes.  If you have any questions.  Other tests or screenings that may be performed during your third trimester include:  Blood tests that check for low iron levels (anemia).  Fetal testing to check the health, activity level, and growth of the fetus. Testing is done if you have certain medical conditions or if there are problems during the  pregnancy.  Nonstress test (NST). This test checks the health of your baby to make sure there are no signs of problems, such as the baby not getting enough oxygen. During this test, a belt is placed around your belly. The baby is made to move, and its heart rate is monitored during movement.  What is false labor? False labor is a condition in which you feel small, irregular tightenings of the muscles in the womb (contractions) that usually go away with rest, changing position, or drinking water. These are called Braxton Hicks contractions. Contractions may last for hours, days, or even weeks before true labor sets in. If contractions come at regular intervals, become more frequent, increase in intensity, or become painful, you should see your health care provider. What are the signs of labor?  Abdominal cramps.  Regular contractions that start at 10 minutes apart and become stronger and more frequent with time.  Contractions that start on the top of the uterus and spread down to the lower abdomen and back.  Increased pelvic pressure and dull back pain.  A watery or bloody mucus discharge that comes from the vagina.  Leaking of amniotic fluid. This is also known as your "water breaking." It could be a slow trickle or a gush. Let your health care provider know if it has a color or strange odor. If you have any of these signs, call your health care provider right away, even if it is before your due date. Follow these instructions at home: Medicines  Follow your health care provider's instructions regarding medicine use. Specific medicines may be either safe or unsafe to take during pregnancy.  Take a prenatal vitamin that contains at least 600 micrograms (mcg) of folic acid.  If you develop constipation, try taking a stool softener if your health care provider approves. Eating and drinking  Eat a balanced diet that includes fresh fruits and vegetables, whole grains, good sources of protein  such as meat, eggs, or tofu, and low-fat dairy. Your health care provider will help you determine the amount of weight gain that is right for you.  Avoid raw meat and uncooked cheese. These carry germs that can cause birth defects in the baby.  If you have low calcium intake from food, talk to your health care provider about whether you should take a daily calcium supplement.  Eat four or five small meals rather than three large meals a day.  Limit foods that are high in fat and processed sugars, such as fried and sweet foods.  To prevent constipation: ? Drink enough fluid to keep your urine clear or pale yellow. ? Eat foods that are high in fiber, such as fresh fruits and vegetables, whole grains, and beans. Activity  Exercise only as directed by your health care provider. Most women can continue their usual exercise routine during pregnancy. Try to exercise for 30 minutes at least 5 days a week. Stop exercising if you experience uterine contractions.  Avoid heavy   lifting.  Do not exercise in extreme heat or humidity, or at high altitudes.  Wear low-heel, comfortable shoes.  Practice good posture.  You may continue to have sex unless your health care provider tells you otherwise. Relieving pain and discomfort  Take frequent breaks and rest with your legs elevated if you have leg cramps or low back pain.  Take warm sitz baths to soothe any pain or discomfort caused by hemorrhoids. Use hemorrhoid cream if your health care provider approves.  Wear a good support bra to prevent discomfort from breast tenderness.  If you develop varicose veins: ? Wear support pantyhose or compression stockings as told by your healthcare provider. ? Elevate your feet for 15 minutes, 3-4 times a day. Prenatal care  Write down your questions. Take them to your prenatal visits.  Keep all your prenatal visits as told by your health care provider. This is important. Safety  Wear your seat belt at  all times when driving.  Make a list of emergency phone numbers, including numbers for family, friends, the hospital, and police and fire departments. General instructions  Avoid cat litter boxes and soil used by cats. These carry germs that can cause birth defects in the baby. If you have a cat, ask someone to clean the litter box for you.  Do not travel far distances unless it is absolutely necessary and only with the approval of your health care provider.  Do not use hot tubs, steam rooms, or saunas.  Do not drink alcohol.  Do not use any products that contain nicotine or tobacco, such as cigarettes and e-cigarettes. If you need help quitting, ask your health care provider.  Do not use any medicinal herbs or unprescribed drugs. These chemicals affect the formation and growth of the baby.  Do not douche or use tampons or scented sanitary pads.  Do not cross your legs for long periods of time.  To prepare for the arrival of your baby: ? Take prenatal classes to understand, practice, and ask questions about labor and delivery. ? Make a trial run to the hospital. ? Visit the hospital and tour the maternity area. ? Arrange for maternity or paternity leave through employers. ? Arrange for family and friends to take care of pets while you are in the hospital. ? Purchase a rear-facing car seat and make sure you know how to install it in your car. ? Pack your hospital bag. ? Prepare the baby's nursery. Make sure to remove all pillows and stuffed animals from the baby's crib to prevent suffocation.  Visit your dentist if you have not gone during your pregnancy. Use a soft toothbrush to brush your teeth and be gentle when you floss. Contact a health care provider if:  You are unsure if you are in labor or if your water has broken.  You become dizzy.  You have mild pelvic cramps, pelvic pressure, or nagging pain in your abdominal area.  You have lower back pain.  You have persistent  nausea, vomiting, or diarrhea.  You have an unusual or bad smelling vaginal discharge.  You have pain when you urinate. Get help right away if:  Your water breaks before 37 weeks.  You have regular contractions less than 5 minutes apart before 37 weeks.  You have a fever.  You are leaking fluid from your vagina.  You have spotting or bleeding from your vagina.  You have severe abdominal pain or cramping.  You have rapid weight loss or weight gain.    You have shortness of breath with chest pain.  You notice sudden or extreme swelling of your face, hands, ankles, feet, or legs.  Your baby makes fewer than 10 movements in 2 hours.  You have severe headaches that do not go away when you take medicine.  You have vision changes. Summary  The third trimester is from week 28 through week 40, months 7 through 9. The third trimester is a time when the unborn baby (fetus) is growing rapidly.  During the third trimester, your discomfort may increase as you and your baby continue to gain weight. You may have abdominal, leg, and back pain, sleeping problems, and an increased need to urinate.  During the third trimester your breasts will keep growing and they will continue to become tender. A yellow fluid (colostrum) may leak from your breasts. This is the first milk you are producing for your baby.  False labor is a condition in which you feel small, irregular tightenings of the muscles in the womb (contractions) that eventually go away. These are called Braxton Hicks contractions. Contractions may last for hours, days, or even weeks before true labor sets in.  Signs of labor can include: abdominal cramps; regular contractions that start at 10 minutes apart and become stronger and more frequent with time; watery or bloody mucus discharge that comes from the vagina; increased pelvic pressure and dull back pain; and leaking of amniotic fluid. This information is not intended to replace advice  given to you by your health care provider. Make sure you discuss any questions you have with your health care provider. Document Released: 01/14/2001 Document Revised: 06/28/2015 Document Reviewed: 03/23/2012 Elsevier Interactive Patient Education  2017 [employer]  

## 2017-05-01 NOTE — Progress Notes (Signed)
  Routine Prenatal Care Visit  Subjective  Jessica StampsDalisha Kuenzi is a 22 y.o. G3P2002 at 6719w3d being seen today for ongoing prenatal care.  She is currently monitored for the following issues for this high-risk pregnancy and has TIA (transient ischemic attack); Stroke-like symptoms; Hypertension; Hypokalemia; Anemia; Postpartum complication; Supervision of high risk pregnancy, antepartum, first trimester; and Pain in symphysis pubis during pregnancy on their problem list.  ----------------------------------------------------------------------------------- Patient reports no complaints.   Contractions: Not present. Vag. Bleeding: None.  Movement: Present. Denies leaking of fluid.  ----------------------------------------------------------------------------------- The following portions of the patient's history were reviewed and updated as appropriate: allergies, current medications, past family history, past medical history, past social history, past surgical history and problem list. Problem list updated.   Objective  Blood pressure 112/64, weight 174 lb (78.9 kg), last menstrual period 09/28/2016 Pregravid weight 184 lb (83.5 kg) Total Weight Gain -10 lb (-4.536 kg) Urinalysis:      Fetal Status: Fetal Heart Rate (bpm): 145 Fundal Height: 31 cm Movement: Present     General:  Alert, oriented and cooperative. Patient is in no acute distress.  Skin: Skin is warm and dry. No rash noted.   Cardiovascular: Normal heart rate noted  Respiratory: Normal respiratory effort, no problems with respiration noted  Abdomen: Soft, gravid, appropriate for gestational age. Pain/Pressure: Absent     Pelvic:  Cervical exam deferred        Extremities: Normal range of motion.  Edema: None  Mental Status: Normal mood and affect. Normal behavior. Normal judgment and thought content.   Assessment   22 y.o. [id number] at 7919w3d by  07/07/2017, by Ultrasound presenting for routine prenatal visit  Plan   pregnancy #3  Problems (from 11/12/16 to present)    Problem Noted Resolved   Supervision of high risk pregnancy, antepartum, first trimester 11/12/2016 by Oswaldo ConroySchmid, Jacelyn Y, CNM No   Overview Signed 01/30/2017  3:34 PM by Oswaldo ConroySchmid, Jacelyn Y, CNM    [facility]inic [facility] Prenatal Labs  Dating  Blood type: A/Positive/-- (10/10 1526)   Genetic Screen 1 Screen:    AFP:     Quad:     NIPS: Antibody:Negative (10/10 1526)  Anatomic US  Rubella: 2.06 (10/10 1526) Varicella:    GTT Early:               Third trimester:  RPR: Non Reactive (10/10 1526)   Rhogam  HBsAg: Negative (10/10 1526)   TDaP vaccine                       Flu Shot: HIV: Non Reactive (10/10 1526)   Baby Food                                GBS:   Contraception  Pap:  CBB     CS/VBAC    Support Person                  Preterm labor symptoms and general obstetric precautions including but not limited to vaginal bleeding, contractions, leaking of fluid and fetal movement were reviewed in detail with the patient. Please refer to After Visit Summary for other counseling recommendations.   Return in about 2 weeks (around 05/15/2017) for rob.  Tresea MallJane Lillyona Polasek, CNM 05/01/2017 3:36 PM

## 2017-05-02 LAB — 28 WEEK RH+PANEL
BASOS ABS: 0 10*3/uL (ref 0.0–0.2)
Basos: 0 %
EOS (ABSOLUTE): 0 10*3/uL (ref 0.0–0.4)
EOS: 0 %
Gestational Diabetes Screen: 87 mg/dL (ref 65–139)
HEMATOCRIT: 29.6 % — AB (ref 34.0–46.6)
HEMOGLOBIN: 9.5 g/dL — AB (ref 11.1–15.9)
HIV SCREEN 4TH GENERATION: NONREACTIVE
Immature Grans (Abs): 0 10*3/uL (ref 0.0–0.1)
Immature Granulocytes: 0 %
LYMPHS ABS: 1.3 10*3/uL (ref 0.7–3.1)
Lymphs: 13 %
MCH: 25.1 pg — ABNORMAL LOW (ref 26.6–33.0)
MCHC: 32.1 g/dL (ref 31.5–35.7)
MCV: 78 fL — ABNORMAL LOW (ref 79–97)
MONOCYTES: 6 %
Monocytes Absolute: 0.6 10*3/uL (ref 0.1–0.9)
NEUTROS ABS: 8.1 10*3/uL — AB (ref 1.4–7.0)
Neutrophils: 81 %
Platelets: 302 10*3/uL (ref 150–379)
RBC: 3.78 x10E6/uL (ref 3.77–5.28)
RDW: 13.2 % (ref 12.3–15.4)
RPR Ser Ql: NONREACTIVE
WBC: 10.1 10*3/uL (ref 3.4–10.8)

## 2017-05-03 ENCOUNTER — Emergency Department: Payer: Medicaid Other

## 2017-05-03 ENCOUNTER — Emergency Department
Admission: EM | Admit: 2017-05-03 | Discharge: 2017-05-03 | Disposition: A | Discharge status: Home / Self Care | Payer: Medicaid Other | Attending: Emergency Medicine | Admitting: Emergency Medicine

## 2017-05-03 ENCOUNTER — Encounter: Payer: Self-pay | Admitting: Emergency Medicine

## 2017-05-03 ENCOUNTER — Other Ambulatory Visit: Payer: Self-pay

## 2017-05-03 DIAGNOSIS — R079 Chest pain, unspecified: Secondary | ICD-10-CM | POA: Insufficient documentation

## 2017-05-03 DIAGNOSIS — Z5321 Procedure and treatment not carried out due to patient leaving prior to being seen by health care provider: Secondary | ICD-10-CM | POA: Diagnosis not present

## 2017-05-03 LAB — CBC
HCT: 29.8 % — ABNORMAL LOW (ref 35.0–47.0)
Hemoglobin: 9.6 g/dL — ABNORMAL LOW (ref 12.0–16.0)
MCH: 24.8 pg — ABNORMAL LOW (ref 26.0–34.0)
MCHC: 32.4 g/dL (ref 32.0–36.0)
MCV: 76.6 fL — AB (ref 80.0–100.0)
PLATELETS: 298 10*3/uL (ref 150–440)
RBC: 3.89 MIL/uL (ref 3.80–5.20)
RDW: 13.4 % (ref 11.5–14.5)
WBC: 8.3 10*3/uL (ref 3.6–11.0)

## 2017-05-03 LAB — BASIC METABOLIC PANEL
Anion gap: 10 (ref 5–15)
CO2: 23 mmol/L (ref 22–32)
CREATININE: 0.64 mg/dL (ref 0.44–1.00)
Calcium: 8.6 mg/dL — ABNORMAL LOW (ref 8.9–10.3)
Chloride: 104 mmol/L (ref 101–111)
GFR calc Af Amer: >60 mL/min (ref >60)
GLUCOSE: 98 mg/dL (ref 65–99)
Potassium: 3 mmol/L — ABNORMAL LOW (ref 3.5–5.1)
SODIUM: 137 mmol/L (ref 135–145)

## 2017-05-03 LAB — TROPONIN I: Troponin I: <0.03 ng/mL (ref <0.03)

## 2017-05-03 LAB — HCG, QUANTITATIVE, PREGNANCY: HCG, BETA CHAIN, QUANT, S: 34316 m[IU]/mL — AB (ref <5)

## 2017-05-03 NOTE — ED Triage Notes (Signed)
Pt arrives ambulatory to triage with c/o chest pain since yesterday. Pt reports that when her baby moves, she feel pain into her upper ribs and across her chest. Pt is in NAD.

## 2017-05-12 ENCOUNTER — Observation Stay
Admission: EM | Admit: 2017-05-12 | Discharge: 2017-05-12 | Disposition: A | Discharge status: Home / Self Care | Payer: Medicaid Other | Attending: Obstetrics and Gynecology | Admitting: Obstetrics and Gynecology

## 2017-05-12 ENCOUNTER — Other Ambulatory Visit: Payer: Self-pay | Admitting: Certified Nurse Midwife

## 2017-05-12 ENCOUNTER — Observation Stay: Payer: Medicaid Other

## 2017-05-12 ENCOUNTER — Encounter: Payer: Self-pay | Admitting: *Deleted

## 2017-05-12 DIAGNOSIS — F419 Anxiety disorder, unspecified: Secondary | ICD-10-CM | POA: Diagnosis not present

## 2017-05-12 DIAGNOSIS — R109 Unspecified abdominal pain: Secondary | ICD-10-CM | POA: Diagnosis not present

## 2017-05-12 DIAGNOSIS — R1011 Right upper quadrant pain: Secondary | ICD-10-CM

## 2017-05-12 DIAGNOSIS — E876 Hypokalemia: Secondary | ICD-10-CM | POA: Diagnosis present

## 2017-05-12 DIAGNOSIS — O26613 Liver and biliary tract disorders in pregnancy, third trimester: Secondary | ICD-10-CM

## 2017-05-12 DIAGNOSIS — O99343 Other mental disorders complicating pregnancy, third trimester: Secondary | ICD-10-CM | POA: Diagnosis not present

## 2017-05-12 DIAGNOSIS — Z3A30 30 weeks gestation of pregnancy: Secondary | ICD-10-CM | POA: Diagnosis not present

## 2017-05-12 DIAGNOSIS — O26893 Other specified pregnancy related conditions, third trimester: Secondary | ICD-10-CM | POA: Diagnosis present

## 2017-05-12 DIAGNOSIS — O99613 Diseases of the digestive system complicating pregnancy, third trimester: Secondary | ICD-10-CM | POA: Insufficient documentation

## 2017-05-12 DIAGNOSIS — K8020 Calculus of gallbladder without cholecystitis without obstruction: Secondary | ICD-10-CM | POA: Insufficient documentation

## 2017-05-12 HISTORY — DX: Calculus of gallbladder without cholecystitis without obstruction: K80.20

## 2017-05-12 HISTORY — DX: Liver and biliary tract disorders in pregnancy, third trimester: O26.613

## 2017-05-12 LAB — COMPREHENSIVE METABOLIC PANEL
ALT: 26 U/L (ref 14–54)
AST: 61 U/L — AB (ref 15–41)
Albumin: 2.7 g/dL — ABNORMAL LOW (ref 3.5–5.0)
Alkaline Phosphatase: 148 U/L — ABNORMAL HIGH (ref 38–126)
Anion gap: 7 (ref 5–15)
BILIRUBIN TOTAL: 0.9 mg/dL (ref 0.3–1.2)
CHLORIDE: 107 mmol/L (ref 101–111)
CO2: 21 mmol/L — ABNORMAL LOW (ref 22–32)
Calcium: 8.6 mg/dL — ABNORMAL LOW (ref 8.9–10.3)
Creatinine, Ser: 0.47 mg/dL (ref 0.44–1.00)
Glucose, Bld: 102 mg/dL — ABNORMAL HIGH (ref 65–99)
POTASSIUM: 2.8 mmol/L — AB (ref 3.5–5.1)
Sodium: 135 mmol/L (ref 135–145)
TOTAL PROTEIN: 6.8 g/dL (ref 6.5–8.1)

## 2017-05-12 LAB — URINALYSIS, COMPLETE (UACMP) WITH MICROSCOPIC
BACTERIA UA: NONE SEEN
BILIRUBIN URINE: NEGATIVE
GLUCOSE, UA: NEGATIVE mg/dL
HGB URINE DIPSTICK: NEGATIVE
Ketones, ur: NEGATIVE mg/dL
LEUKOCYTES UA: NEGATIVE
NITRITE: NEGATIVE
Protein, ur: NEGATIVE mg/dL
RBC / HPF: NONE SEEN RBC/hpf (ref 0–5)
SPECIFIC GRAVITY, URINE: 1.015 (ref 1.005–1.030)
WBC, UA: NONE SEEN WBC/hpf (ref 0–5)
pH: 8 (ref 5.0–8.0)

## 2017-05-12 LAB — CBC
HEMATOCRIT: 28.6 % — AB (ref 35.0–47.0)
Hemoglobin: 9.5 g/dL — ABNORMAL LOW (ref 12.0–16.0)
MCH: 24.9 pg — ABNORMAL LOW (ref 26.0–34.0)
MCHC: 33.1 g/dL (ref 32.0–36.0)
MCV: 75.2 fL — AB (ref 80.0–100.0)
PLATELETS: 286 10*3/uL (ref 150–440)
RBC: 3.8 MIL/uL (ref 3.80–5.20)
RDW: 13.5 % (ref 11.5–14.5)
WBC: 8.1 10*3/uL (ref 3.6–11.0)

## 2017-05-12 LAB — LIPASE, BLOOD: LIPASE: 25 U/L (ref 11–51)

## 2017-05-12 LAB — AMYLASE: AMYLASE: 46 U/L (ref 28–100)

## 2017-05-12 MED ORDER — MORPHINE SULFATE (PF) 2 MG/ML IV SOLN
2.0000 mg | INTRAVENOUS | Status: DC | PRN
  Administered 2017-05-12: 2 mg via INTRAVENOUS
  Filled 2017-05-12: qty 1

## 2017-05-12 MED ORDER — POTASSIUM CHLORIDE CRYS ER 20 MEQ PO TBCR: 20.0000 meq | EXTENDED_RELEASE_TABLET | Freq: Two times a day (BID) | ORAL | 0 refills | Status: DC

## 2017-05-12 MED ORDER — LACTATED RINGERS IV SOLN
INTRAVENOUS | Status: DC
  Administered 2017-05-12: 1000 mL via INTRAVENOUS

## 2017-05-12 MED ORDER — POTASSIUM CHLORIDE CRYS ER 20 MEQ PO TBCR
20.0000 meq | EXTENDED_RELEASE_TABLET | Freq: Once | ORAL | Status: AC
  Administered 2017-05-12: 20 meq via ORAL
  Filled 2017-05-12: qty 1

## 2017-05-12 MED ORDER — POTASSIUM CHLORIDE 10 MEQ/100ML IV SOLN
10.0000 meq | INTRAVENOUS | Status: DC
  Administered 2017-05-12: 10 meq via INTRAVENOUS
  Filled 2017-05-12 (×2): qty 100

## 2017-05-12 MED ORDER — OXYCODONE-ACETAMINOPHEN 5-325 MG PO TABS: 1.0000 | ORAL_TABLET | Freq: Four times a day (QID) | ORAL | 0 refills | Status: AC | PRN

## 2017-05-12 MED ORDER — ONDANSETRON 4 MG PO TBDP: 4.0000 mg | ORAL_TABLET | Freq: Four times a day (QID) | ORAL | 0 refills | Status: DC | PRN

## 2017-05-12 MED ORDER — ONDANSETRON HCL 4 MG/2ML IJ SOLN
4.0000 mg | Freq: Four times a day (QID) | INTRAMUSCULAR | Status: DC | PRN
  Administered 2017-05-12: 4 mg via INTRAVENOUS
  Filled 2017-05-12: qty 2

## 2017-05-12 MED ORDER — SODIUM CHLORIDE FLUSH 0.9 % IV SOLN
INTRAVENOUS | Status: AC
  Administered 2017-05-12: 10 mL
  Filled 2017-05-12: qty 10

## 2017-05-12 NOTE — Discharge Instructions (Signed)
Low-Fat Diet for Pancreatitis or Gallbladder Conditions °A low-fat diet can be helpful if you have pancreatitis or a gallbladder condition. With these conditions, your pancreas and gallbladder have trouble digesting fats. A healthy eating plan with less fat will help rest your pancreas and gallbladder and reduce your symptoms. °What do I need to know about this diet? °· Eat a low-fat diet. °¨ Reduce your fat intake to less than 20-30% of your total daily calories. This is less than 50-60 g of fat per day. °¨ Remember that you need some fat in your diet. Ask your dietician what your daily goal should be. °¨ Choose nonfat and low-fat healthy foods. Look for the words “nonfat,” “low fat,” or “fat free.” °¨ As a guide, look on the label and choose foods with less than 3 g of fat per serving. Eat only one serving. °· Avoid alcohol. °· Do not smoke. If you need help quitting, talk with your health care provider. °· Eat small frequent meals instead of three large heavy meals. °What foods can I eat? °Grains  °Include healthy grains and starches such as potatoes, wheat bread, fiber-rich cereal, and brown rice. Choose whole grain options whenever possible. In adults, whole grains should account for 45-65% of your daily calories. °Fruits and Vegetables  °Eat plenty of fruits and vegetables. Fresh fruits and vegetables add fiber to your diet. °Meats and Other Protein Sources  °Eat lean meat such as chicken and pork. Trim any fat off of meat before cooking it. Eggs, fish, and beans are other sources of protein. In adults, these foods should account for 10-35% of your daily calories. °Dairy  °Choose low-fat milk and dairy options. Dairy includes fat and protein, as well as calcium. °Fats and Oils  °Limit high-fat foods such as fried foods, sweets, baked goods, sugary drinks. °Other  °Creamy sauces and condiments, such as mayonnaise, can add extra fat. Think about whether or not you need to use them, or use smaller amounts or low  fat options. °What foods are not recommended? °· High fat foods, such as: °¨ Baked goods. °¨ Ice cream. °¨ French toast. °¨ Sweet rolls. °¨ Pizza. °¨ Cheese bread. °¨ Foods covered with batter, butter, creamy sauces, or cheese. °¨ Fried foods. °¨ Sugary drinks and desserts. °· Foods that cause gas or bloating °This information is not intended to replace advice given to you by your health care provider. Make sure you discuss any questions you have with your health care provider. °Document Released: 01/25/2013 Document Revised: 06/28/2015 Document Reviewed: 01/03/2013 °Elsevier Interactive Patient Education © 2017 [employer] °Cholelithiasis °Cholelithiasis is also called "gallstones." It is a kind of gallbladder disease. The gallbladder is an organ that stores a liquid (bile) that helps you digest fat. Gallstones may not cause symptoms (may be silent gallstones) until they cause a blockage, and then they can cause pain (gallbladder attack). °Follow these instructions at home: °· Take over-the-counter and prescription medicines only as told by your doctor. °· Stay at a healthy weight. °· Eat healthy foods. This includes: °¨ Eating fewer fatty foods, like fried foods. °¨ Eating fewer refined carbs (refined carbohydrates). Refined carbs are breads and grains that are highly processed, like white bread and white rice. Instead, choose whole grains like whole-wheat bread and brown rice. °¨ Eating more fiber. Almonds, fresh fruit, and beans are healthy sources of fiber. °· Keep all follow-up visits as told by your doctor. This is important. °Contact a doctor if: °· You have sudden pain   in the upper right side of your belly (abdomen). Pain might spread to your right shoulder or your chest. This may be a sign of a gallbladder attack. °· You feel sick to your stomach (are nauseous). °· You throw up (vomit). °· You have been diagnosed with gallstones that have no symptoms and you get: °¨ Belly pain. °¨ Discomfort, burning, or  fullness in the upper part of your belly (indigestion). °Get help right away if: °· You have sudden pain in the upper right side of your belly, and it lasts for more than 2 hours. °· You have belly pain that lasts for more than 5 hours. °· You have a fever or chills. °· You keep feeling sick to your stomach or you keep throwing up. °· Your skin or the whites of your eyes turn yellow (jaundice). °· You have dark-colored pee (urine). °· You have light-colored poop (stool). °Summary °· Cholelithiasis is also called "gallstones." °· The gallbladder is an organ that stores a liquid (bile) that helps you digest fat. °· Silent gallstones are gallstones that do not cause symptoms. °· A gallbladder attack may cause sudden pain in the upper right side of your belly. Pain might spread to your right shoulder or your chest. If this happens, contact your doctor. °· If you have sudden pain in the upper right side of your belly that lasts for more than 2 hours, get help right away. °This information is not intended to replace advice given to you by your health care provider. Make sure you discuss any questions you have with your health care provider. °Document Released: 07/09/2007 Document Revised: 10/07/2015 Document Reviewed: 10/07/2015 °Elsevier Interactive Patient Education © 2017 [employer] ° °

## 2017-05-12 NOTE — OB Triage Note (Signed)
Presents with complaints of waking up this morning around 4 am and experiencing abdominal pain. Denies any bleeding or leaking of fluid. States that she is having to void frequently and only voiding small amounts. Denies burning with urination but states abdominal pain comes and goes. Marland Kitchen. EFM applied. Abdomen palpated- soft to touch. Provider of call notified of pt. Complaint.

## 2017-05-12 NOTE — Progress Notes (Signed)
Pt discharged to home after receiving discharge instructions for medications and diet restrictions. Pt verbalized understanding.

## 2017-05-12 NOTE — Final Progress Note (Signed)
Physician Final Progress Note  Patient ID: Jessica Randall MRN: [id number] DOB/AGE: [birth year] 22 y.o.  Admit date: 05/12/2017 Admitting provider: Vena Austria, MD/ Gasper Lloyd. Sharen Hones, CNM Discharge date: 05/12/2017   Admission Diagnoses: Abdominal pain in pregnancy IUP at 30 weeks  Discharge Diagnoses: Cholelithiasis in pregnancy Hypokalemia  Consults: None  Significant Findings/ Diagnostic Studies: 22 year old G3 P2002 with EDC=07/07/2017 by a 7wk1day ultrasound presented with onset of colicky upper abdominal pain radiating to her back (between her scapulas)  since 0400 this AM. The pain was accompanied by nausea, but no vomiting. No constipation or diarrhea. Last BM yesterday. She felt like the baby was "up underneath her rib cage." Had a similar episode 2 weeks ago with the pain radiating to her chest. Was seen in the ER for the pain and had a cardiac evaluation, which was normal. Last ate last night: chicken Alfredo. When she awoke this AM, she tried eating a few bites of it again, but stopped with the pain worsening.   ROS: comprehensive review of systems also remarkable for urinary urgency and  urinary frequency. Pertinent negatives include vaginal bleeding, dysuria, hematuria.  [facility] at [facility] notable for anemia (not taking vitamins or iron), stroke like symptoms and preeclampsia a week after her last delivery in 2017, history of anxiety and depression, and fetal arrhythmia (PACs) in mid second trimester Prenatal care also remarkable for  Clinic [facility] Prenatal Labs  Dating 7wk1d Blood type: A/Positive/-- (10/10 1526)   Genetic Screen 1 Screen:    AFP:     Quad:     NIPS: Antibody:Negative (10/10 1526)  Anatomic [place] Normal anatomy, anterior placenta, fetal arrhythmia (PACs) Rubella: 2.06 (10/10 1526) Varicella:    GTT Early:               Third trimester: 87 RPR: Non Reactive (10/10 1526)   Rhogam  HBsAg: Negative (10/10 1526)   TDaP vaccine      03/22/2015                  Flu Shot: HIV: Non Reactive (10/10 1526)   Baby Food                                GBS:   Contraception  Pap:  CBB     CS/VBAC    Support Person         Past Medical History:  Diagnosis Date  . Anemia   . Anxiety and depression    history of SI  . History of drug use    MJ  . History of gonorrhea    with G2   Past Surgical History:  Procedure Laterality Date  . NO PAST SURGERIES     OB History  Gravida Para Term Preterm AB Living  3 2 2  0 0 2  SAB TAB Ectopic Multiple Live Births          2    # Outcome Date GA Lbr Len/2nd Weight Sex Delivery Anes PTL Lv  3 Current           2 Term 03/21/15 [redacted]w[redacted]d  3.09 kg (6 lb 13 oz) M Vag-Spont  N LIV  1 Term 01/24/12 [redacted]w[redacted]d  3.175 kg (7 lb) M Vag-Spont  N LIV    Obstetric Comments  01/2012: 7lbs 3oz (Dr. Tiburcio Pea, [facility]) median episotomy; type of tear not documented.    Social History   Socioeconomic History  .  Marital status: Single    Spouse name: Not on file  . Number of children: 2  . Years of education: Not on file  . Highest education level: Not on file  Occupational History  . Not on file  Social Needs  . Financial resource strain: Not on file  . Food insecurity:    Worry: Not on file    Inability: Not on file  . Transportation needs:    Medical: Not on file    Non-medical: Not on file  Tobacco Use  . Smoking status: Never Smoker  . Smokeless tobacco: Never Used  Substance and Sexual Activity  . Alcohol use: No    Alcohol/week: 0.0 oz    Comment: occ.   . Drug use: No    Comment: h/o THC use  . Sexual activity: Yes    Birth control/protection: None  Lifestyle  . Physical activity:    Days per week: Not on file    Minutes per session: Not on file  . Stress: Not on file  Relationships  . Social connections:    Talks on phone: Not on file    Gets together: Not on file    Attends religious service: Not on file    Active member of club or organization: Not on file    Attends meetings of  clubs or organizations: Not on file    Relationship status: Not on file  . Intimate partner violence:    Fear of current or ex partner: Not on file    Emotionally abused: Not on file    Physically abused: Not on file    Forced sexual activity: Not on file  Other Topics Concern  . Not on file  Social History Narrative  . Not on file   Family History  Problem Relation Age of Onset  . Lupus Mother   . Hypertension Mother   . Hyperthyroidism Mother   . Hyperlipidemia Mother   . Hypertension Maternal Grandmother   . Diabetes Maternal Grandmother   . Transient ischemic attack Maternal Grandmother   . Hyperlipidemia Maternal Grandfather   . Breast cancer Paternal Grandmother   . Transient ischemic attack Paternal Grandmother    Exam:  BP (!) 98/51   Pulse 73   Temp 98.2 F (36.8 C) (Oral)   Resp 18   Ht 5\' 1"  (1.549 m)   Wt 78.9 kg (174 lb)   LMP 09/28/2016 (Approximate)   BMI 32.88 kg/m   General: appears uncomfortable, changing positions frequently HEENT: oral mucosa moist, conjunctiva non icteric Neck: no masses Heart: RRR without murmur Lungs: CTAB, normal respiratory effort Abdomen: soft, non distended, BS present x4, +Murphy's sign. Uterus soft and contractions not palpable, uterus NT Back: non tender in area of pain, negative CVAT Extremities: no edema, no calf tenderness FHR: 125 baseline with accelerations to 140, moderate variability Toco: no contractions seen Neuro: oriented x3, awake and alert Psyche: normal mood and affect  US Abdomen Limited Ruq  Result Date: 05/12/2017 CLINICAL DATA:  Right upper quadrant pain EXAM: ULTRASOUND ABDOMEN LIMITED RIGHT UPPER QUADRANT COMPARISON:  None. FINDINGS: Gallbladder: Within the gallbladder, there are multiple echogenic foci which move and shadow consistent with gallstones. Largest gallstone measures 9 mm in length. Sludge is also noted in the gallbladder. There is no gallbladder wall thickening or pericholecystic fluid.  There is localized tenderness over the gallbladder. Common bile duct: Diameter: 8 mm which is prominent. A 3 mm calculus is noted in the proximal common bile duct, incompletely  obstructing. Liver: No focal lesion identified. Within normal limits in parenchymal echogenicity. Portal vein is patent on color Doppler imaging with normal direction of blood flow towards the liver. IMPRESSION: 1. Cholelithiasis with focal tenderness over the gallbladder. Early acute cholecystitis is of concern given these findings. 2. Prominent common bile duct with an incompletely obstructing calculus in the proximal common bile. No intrahepatic biliary duct dilatation evident. 3.  No evident liver lesions. These results will be called to the ordering clinician or representative by the Radiologist Assistant, and communication documented in the PACS or zVision Dashboard. Electronically Signed   By: Bretta Bang III M.D.   On: 05/12/2017 10:11   Results for orders placed or performed during the hospital encounter of 05/12/17 (from the past 24 hour(s))  Urinalysis, Complete w Microscopic     Status: Abnormal   Collection Time: 05/12/17  8:23 AM  Result Value Ref Range   Color, Urine AMBER (A) YELLOW   APPearance TURBID (A) CLEAR   Specific Gravity, Urine 1.015 1.005 - 1.030   pH 8.0 5.0 - 8.0   Glucose, UA NEGATIVE NEGATIVE mg/dL   Hgb urine dipstick NEGATIVE NEGATIVE   Bilirubin Urine NEGATIVE NEGATIVE   Ketones, ur NEGATIVE NEGATIVE mg/dL   Protein, ur NEGATIVE NEGATIVE mg/dL   Nitrite NEGATIVE NEGATIVE   Leukocytes, UA NEGATIVE NEGATIVE   RBC / HPF NONE SEEN 0 - 5 RBC/hpf   WBC, UA NONE SEEN 0 - 5 WBC/hpf   Bacteria, UA NONE SEEN NONE SEEN   Squamous Epithelial / LPF 0-5 (A) NONE SEEN   Mucus PRESENT    Amorphous Crystal PRESENT   CBC     Status: Abnormal   Collection Time: 05/12/17  9:05 AM  Result Value Ref Range   WBC 8.1 3.6 - 11.0 K/uL   RBC 3.80 3.80 - 5.20 MIL/uL   Hemoglobin 9.5 (L) 12.0 - 16.0 g/dL    HCT 09.8 (L) 11.9 - 47.0 %   MCV 75.2 (L) 80.0 - 100.0 fL   MCH 24.9 (L) 26.0 - 34.0 pg   MCHC 33.1 32.0 - 36.0 g/dL   RDW 14.7 82.9 - 56.2 %   Platelets 286 150 - 440 K/uL  Comprehensive metabolic panel     Status: Abnormal   Collection Time: 05/12/17  9:05 AM  Result Value Ref Range   Sodium 135 135 - 145 mmol/L   Potassium 2.8 (L) 3.5 - 5.1 mmol/L   Chloride 107 101 - 111 mmol/L   CO2 21 (L) 22 - 32 mmol/L   Glucose, Bld 102 (H) 65 - 99 mg/dL   BUN <5 (L) 6 - 20 mg/dL   Creatinine, Ser 1.30 0.44 - 1.00 mg/dL   Calcium 8.6 (L) 8.9 - 10.3 mg/dL   Total Protein 6.8 6.5 - 8.1 g/dL   Albumin 2.7 (L) 3.5 - 5.0 g/dL   AST 61 (H) 15 - 41 U/L   ALT 26 14 - 54 U/L   Alkaline Phosphatase 148 (H) 38 - 126 U/L   Total Bilirubin 0.9 0.3 - 1.2 mg/dL   GFR calc non Af Amer >60 >60 mL/min   GFR calc Af Amer >60 >60 mL/min   Anion gap 7 5 - 15  Lipase, blood     Status: None   Collection Time: 05/12/17  9:05 AM  Result Value Ref Range   Lipase 25 11 - 51 U/L  Amylase     Status: None   Collection Time: 05/12/17  9:05 AM  Result Value Ref Range   Amylase 46 28 - 100 U/L   A/P: IUP at 30 weeks with cholelithiasis, mild AST elevation  Pain improved after morphine  Tolerating liquids  Will discharge on low fat diet/ oral analgesics  Will get OP consult for surgeon Hypokalemia  IV KCL, will discharge on KCL Anemia: needs vitamins and iron  Procedures: Limited upper abdominal ultrasound  Discharge Condition: stable, improved  Disposition: Discharge disposition: 01-Home or Self Care     home  Diet: Low fat diet  Discharge Activity: Activity as tolerated  Discharge Instructions    Discharge patient   Complete by:  As directed    Discharge disposition:  01-Home or Self Care   Discharge patient date:  05/12/2017   Discontinue IV   Complete by:  As directed      Allergies as of 05/12/2017   No Known Allergies     Medication List    TAKE these medications   aspirin EC  81 MG tablet Take 1 tablet (81 mg total) by mouth daily. Take after 12 weeks for prevention of preeclampssia later in pregnancy   clobetasol cream 0.05 % Commonly known as:  TEMOVATE Apply 1 application topically 2 (two) times daily.   hydrocortisone 1 % Crea Commonly known as:  PROCTOCORT Apply to affected skin 2 times daily   ondansetron 4 MG disintegrating tablet Commonly known as:  ZOFRAN ODT Take 1 tablet (4 mg total) by mouth every 6 (six) hours as needed for nausea.   oxyCODONE-acetaminophen 5-325 MG tablet Commonly known as:  PERCOCET Take 1-2 tablets by mouth every 6 (six) hours as needed for up to 7 days for moderate pain or severe pain (due to gallstones).   potassium chloride SA 20 MEQ tablet Commonly known as:  K-DUR,KLOR-CON Take 1 tablet (20 mEq total) by mouth 2 (two) times daily for 3 days.        Total time spent taking care of this patient: 30 minutes  Signed: Farrel Connersolleen Laurana Magistro 05/12/2017, 11:48 AM

## 2017-05-13 ENCOUNTER — Telehealth: Payer: Self-pay

## 2017-05-13 ENCOUNTER — Other Ambulatory Visit: Payer: Self-pay

## 2017-05-13 ENCOUNTER — Encounter: Payer: Self-pay | Admitting: *Deleted

## 2017-05-13 ENCOUNTER — Observation Stay
Admission: EM | Admit: 2017-05-13 | Discharge: 2017-05-14 | Disposition: A | Discharge status: Other Acute Inpatient Hospital | Payer: Medicaid Other | Attending: Maternal Newborn | Admitting: Maternal Newborn

## 2017-05-13 DIAGNOSIS — O26613 Liver and biliary tract disorders in pregnancy, third trimester: Secondary | ICD-10-CM | POA: Diagnosis not present

## 2017-05-13 DIAGNOSIS — Z79899 Other long term (current) drug therapy: Secondary | ICD-10-CM | POA: Diagnosis not present

## 2017-05-13 DIAGNOSIS — K8020 Calculus of gallbladder without cholecystitis without obstruction: Secondary | ICD-10-CM

## 2017-05-13 DIAGNOSIS — Z3A32 32 weeks gestation of pregnancy: Secondary | ICD-10-CM | POA: Diagnosis not present

## 2017-05-13 DIAGNOSIS — O0991 Supervision of high risk pregnancy, unspecified, first trimester: Secondary | ICD-10-CM

## 2017-05-13 DIAGNOSIS — O26893 Other specified pregnancy related conditions, third trimester: Secondary | ICD-10-CM | POA: Diagnosis present

## 2017-05-13 DIAGNOSIS — O99343 Other mental disorders complicating pregnancy, third trimester: Secondary | ICD-10-CM | POA: Insufficient documentation

## 2017-05-13 DIAGNOSIS — O99213 Obesity complicating pregnancy, third trimester: Secondary | ICD-10-CM | POA: Insufficient documentation

## 2017-05-13 DIAGNOSIS — Z7982 Long term (current) use of aspirin: Secondary | ICD-10-CM | POA: Diagnosis not present

## 2017-05-13 DIAGNOSIS — F419 Anxiety disorder, unspecified: Secondary | ICD-10-CM | POA: Insufficient documentation

## 2017-05-13 DIAGNOSIS — F329 Major depressive disorder, single episode, unspecified: Secondary | ICD-10-CM | POA: Diagnosis not present

## 2017-05-13 DIAGNOSIS — K8062 Calculus of gallbladder and bile duct with acute cholecystitis without obstruction: Secondary | ICD-10-CM | POA: Diagnosis not present

## 2017-05-13 LAB — URINE CULTURE: SPECIAL REQUESTS: NORMAL

## 2017-05-13 MED ORDER — ACETAMINOPHEN 325 MG PO TABS: 650.0000 mg | ORAL_TABLET | ORAL | Status: DC | PRN

## 2017-05-13 NOTE — OB Triage Note (Signed)
Pt arrived in triage with c/o adominal pain that is "the same" as she felt on her visit here on 4/9 where she was told she has "gall stones". Pt reports pain started after eating baked chicken. She reports decreased fetal movement. Last felt baby move prior to taking prescribed oxy. Took oxy around 2200, then got in shower. Pt denies leaking of fluid or vaginal bleeding. Denies nausea/vomiting.

## 2017-05-13 NOTE — Telephone Encounter (Signed)
Pt was seen in L&D yesterday for pain from gallstones, was given oxycodone-acetaminophen.  Wants to know if okay to take.  Adv CLG wouldn't have given them to her if it wasn't okay for her to take.

## 2017-05-14 DIAGNOSIS — K8020 Calculus of gallbladder without cholecystitis without obstruction: Secondary | ICD-10-CM

## 2017-05-14 DIAGNOSIS — Z3A32 32 weeks gestation of pregnancy: Secondary | ICD-10-CM | POA: Diagnosis not present

## 2017-05-14 DIAGNOSIS — O26893 Other specified pregnancy related conditions, third trimester: Secondary | ICD-10-CM | POA: Diagnosis not present

## 2017-05-14 DIAGNOSIS — O26613 Liver and biliary tract disorders in pregnancy, third trimester: Secondary | ICD-10-CM | POA: Diagnosis not present

## 2017-05-14 MED ORDER — MORPHINE SULFATE (PF) 2 MG/ML IV SOLN
2.0000 mg | INTRAVENOUS | Status: DC | PRN
  Administered 2017-05-14 (×3): 2 mg via INTRAVENOUS
  Filled 2017-05-14 (×3): qty 1

## 2017-05-14 MED ORDER — LACTATED RINGERS IV SOLN
INTRAVENOUS | Status: DC
  Administered 2017-05-14 (×3): via INTRAVENOUS

## 2017-05-14 NOTE — H&P (Signed)
Obstetric H&P   Chief Complaint: Abdominal pain, decreased fetal movement.  Prenatal Care Provider: [facility]  History of Present Illness: 22 y.o. [id number] 7978w2d, EDD 07/07/2017 by Ultrasound presenting to L&D with colicky upper abdominal pain that started this evening after eating some baked chicken. She was seen in triage for similar pain on 4/9 and ultrasound imaging showed multiple gallstones with the largest 9 mm in length, Sludge was noted, as well as a 3 mm calculus incompletely obstructing the common bile duct. She took Percocet which had been prescribed yesterday which was not effective in relieving the pain. She noticed decreased fetal movement after taking the medication. Denies vaginal bleeding and leaking fluid. No nausea, vomiting, diarrhea.  Pregravid weight 184 lb (83.5 kg) Total Weight Gain -10 lb (-4.536 kg)  pregnancy #3 Problems (from 11/12/16 to present)    Problem Noted Resolved   Cholelithiasis affecting pregnancy in third trimester, antepartum 05/12/2017 by Farrel ConnersGutierrez, Colleen, CNM No   Supervision of high risk pregnancy, antepartum, first trimester 11/12/2016 by Oswaldo ConroySchmid, Rashika Bettes Y, CNM No   Overview Addendum 05/12/2017 10:59 AM by Farrel ConnersGutierrez, Colleen, CNM    Clinic [facility] Prenatal Labs  Dating 7wk1d ultrasound Blood type: A/Positive/-- (10/10 1526)   Genetic Screen 1 Screen:    AFP:     Quad:     NIPS: Antibody:Negative (10/10 1526)  Anatomic US  Rubella: 2.06 (10/10 1526) Varicella:    GTT Early:               Third trimester:  RPR: Non Reactive (10/10 1526)   Rhogam  HBsAg: Negative (10/10 1526)   TDaP vaccine                       Flu Shot: HIV: Non Reactive (10/10 1526)   Baby Food                                GBS:   Contraception  Pap:  CBB     CS/VBAC    Support Person                  Review of Systems: 10 point review of systems negative unless otherwise noted in HPI  Past Medical History: Past Medical History:  Diagnosis Date  . Anemia   . Anxiety  and depression    history of SI  . History of drug use    MJ  . History of gonorrhea    with G2    Past Surgical History: Past Surgical History:  Procedure Laterality Date  . NO PAST SURGERIES      Past Obstetric History: [id number]  Past Gynecologic History: N/A  Family History: Family History  Problem Relation Age of Onset  . Lupus Mother   . Hypertension Mother   . Hyperthyroidism Mother   . Hyperlipidemia Mother   . Hypertension Maternal Grandmother   . Diabetes Maternal Grandmother   . Transient ischemic attack Maternal Grandmother   . Hyperlipidemia Maternal Grandfather   . Breast cancer Paternal Grandmother   . Transient ischemic attack Paternal Grandmother     Social History: Social History   Socioeconomic History  . Marital status: Single    Spouse name: Not on file  . Number of children: 2  . Years of education: Not on file  . Highest education level: Not on file  Occupational History  . Not on file  Social Needs  .  Financial resource strain: Not on file  . Food insecurity:    Worry: Not on file    Inability: Not on file  . Transportation needs:    Medical: Not on file    Non-medical: Not on file  Tobacco Use  . Smoking status: Never Smoker  . Smokeless tobacco: Never Used  Substance and Sexual Activity  . Alcohol use: No    Alcohol/week: 0.0 oz    Comment: occ.   . Drug use: No    Comment: h/o THC use  . Sexual activity: Yes    Birth control/protection: None  Lifestyle  . Physical activity:    Days per week: Not on file    Minutes per session: Not on file  . Stress: Not on file  Relationships  . Social connections:    Talks on phone: Not on file    Gets together: Not on file    Attends religious service: Not on file    Active member of club or organization: Not on file    Attends meetings of clubs or organizations: Not on file    Relationship status: Not on file  . Intimate partner violence:    Fear of current or ex partner: Not on  file    Emotionally abused: Not on file    Physically abused: Not on file    Forced sexual activity: Not on file  Other Topics Concern  . Not on file  Social History Narrative  . Not on file    Medications: Prior to Admission medications   Medication Sig Start Date End Date Taking? Authorizing Provider  clobetasol cream (TEMOVATE) 0.05 % Apply 1 application topically 2 (two) times daily. 05/01/17  Yes Tresea Mall, CNM  ondansetron (ZOFRAN ODT) 4 MG disintegrating tablet Take 1 tablet (4 mg total) by mouth every 6 (six) hours as needed for nausea. 05/12/17  Yes [name], CNM  oxyCODONE-acetaminophen (PERCOCET) 5-325 MG tablet Take 1-2 tablets by mouth every 6 (six) hours as needed for up to 7 days for moderate pain or severe pain (due to gallstones). 05/12/17 05/19/17 Yes [name], CNM  potassium chloride SA (K-DUR,KLOR-CON) 20 MEQ tablet Take 1 tablet (20 mEq total) by mouth 2 (two) times daily for 3 days. 05/12/17 05/15/17 Yes [name], CNM  Prenatal Vit-Fe Fumarate-FA (MULTIVITAMIN-PRENATAL) 27-0.8 MG TABS tablet Take 1 tablet by mouth daily at 12 noon.   Yes [provider]  aspirin EC 81 MG tablet Take 1 tablet (81 mg total) by mouth daily. Take after 12 weeks for prevention of preeclampssia later in pregnancy Patient not taking: Reported on 03/02/2017 01/30/17   Oswaldo Conroy, CNM  hydrocortisone (PROCTOCORT) 1 % CREA Apply to affected skin 2 times daily Patient not taking: Reported on 03/02/2017 02/20/17   Tresea Mall, CNM    Allergies: No Known Allergies  Physical Exam: Vitals: Blood pressure (!) 103/58, pulse 74, temperature 97.8 F (36.6 C), temperature source Oral, resp. rate 16, height 5\' 1"  (1.549 m), weight 174 lb (78.9 kg), last menstrual period 09/28/2016, SpO2 100 %.  General: uncomfortable, writhing in bed HEENT: normocephalic, anicteric Pulmonary: No increased work of breathing Cardiovascular: Regular rate Abdomen: Gravid,  tender, soft and non-distended Genitourinary: deferred Extremities: no edema, erythema, or tenderness Neurologic: Grossly intact Psychiatric: mood appropriate, affect full  NST:  Baseline: 125 Variability: moderate Accelerations: present Decelerations: absent Tocometry: no contractions Reactive NST.  Labs: No results found for this or any previous visit (from the past 24 hour(s)).  Assessment:  22 y.o. [id number] [redacted]w[redacted]d by 07/07/2017, by Ultrasound with abdominal pain from gallstones, reactive NST.  Plan: 1) Observation in triage with IV fluids and morphine for pain control.  2) Fetus - FWB reassuring, Category I.  3) PNL - Blood type A/Positive/-- (10/10 1526) / Anti-bodyscreen Negative (10/10 1526) / Rubella 2.06 (10/10 1526) / Varicella immune / RPR Non Reactive (03/29 1539) / HBsAg Negative (10/10 1526) / HIV Non Reactive (03/29 1539) /GBS unknown  4) Immunization History -  Immunization History  Administered Date(s) Administered  . Tdap 03/22/2015    5) Disposition - Needs surgical consult to determine if appropriate candidate.  Marcelyn Bruins, CNM 05/14/2017  4:26 AM

## 2017-05-14 NOTE — Discharge Summary (Signed)
Discharge Summary   Patient ID: Jessica Randall [id number] 21 y.o. [birth year]  Admit date: 05/13/2017  Discharge date: 05/14/2017  Principal Diagnoses:  1) intrauterine pregnancy at [redacted]w[redacted]d  2) acute cholecystitis  Secondary Diagnoses:  hypokalemia  Procedures performed during the hospitalization:  Fetal monitoring Maternal monitoring General surgery consult Pain management  HPI: 22 y.o. [name] female at [redacted]w[redacted]d dated by 7 week ultrasound, her pregnancy has been complicated by history of gonorrhea, history of drug use, obesity, and cholelithiasis presented for the second time in two days for right upper quadrant abdominal pain.  She notes the pain is 10/10, non-radiating. Pain medication makes it feel better. Eating makes it feel worse.  Associated symptoms include nausea. She denies fevers, chills.  She had a right upper quadrant ultrasound on 4/9 that showed at least three calculi in the gall bladder, the largest measuring 9 mm.  Biliary sludge was noted. No pericholecystic fluid or gallbladder wall thickening noted. Additionally a 3 mm incompletely obstructing calculus was noted in the common bile duct.  She had a mild positive sonographic Murphy's sign.  Also noted at her first presentation was hypokalemia with a K+ of 2.8.  She was given IV KCl prior to discharge and was given PO KCl, which she took yesterday.    Past Medical History:  Diagnosis Date  . Anemia   . Anxiety and depression    history of SI  . History of drug use    MJ  . History of gonorrhea    with G2    Past Surgical History:  Procedure Laterality Date  . NO PAST SURGERIES     Allergies: No Known Allergies  Social History   Tobacco Use  . Smoking status: Never Smoker  . Smokeless tobacco: Never Used  Substance Use Topics  . Alcohol use: No    Alcohol/week: 0.0 oz    Comment: occ.   . Drug use: No    Comment: h/o THC use    Family History  Problem Relation Age of Onset  . Lupus Mother   .  Hypertension Mother   . Hyperthyroidism Mother   . Hyperlipidemia Mother   . Hypertension Maternal Grandmother   . Diabetes Maternal Grandmother   . Transient ischemic attack Maternal Grandmother   . Hyperlipidemia Maternal Grandfather   . Breast cancer Paternal Grandmother   . Transient ischemic attack Paternal [facility] Course: she was admitted to labor and delivery triage and observed overnight. She required multiple doses of IV pain medication.  A general surgery consult was obtained.  Cholecystectomy was recommended. However, the surgeon deferred performing this procedure due to lack of comfort in a pregnant patient of this advanced gestational age.  [facility] was contacted and accepted the patient in transfer for further evaluation for treatment.  It was discussed with the patient that while surgery was recommended by the surgeon evaluating her at our institution, she would have an independent evaluation at [facility] whereupon a decision would be made regarding the need for a cholecystectomy by the surgery team at [facility].  She voiced understanding of this information and desired transfer to [facility].  She noted positive fetal movement, no contractions. She denied vaginal bleeding and leaking fluid. Her vitals signs were normal and stable throughout her admission. Her fetal tracing was category 1 the entire time of her admission at [facility]. She was held NPO during her stay at our institution.   Discharge Exam: BP 104/60 (BP Location: Right Arm)  Pulse 74   Temp 98.2 F (36.8 C) (Oral)   Resp 14   Ht 5\' 1"  (1.549 m)   Wt 174 lb (78.9 kg)   LMP 09/28/2016 (Approximate)   SpO2 100%   BMI 32.88 kg/m  Physical Exam  Constitutional: She is oriented to person, place, and time and well-developed, well-nourished, and in no distress. No distress.  HENT:  Head: Normocephalic and atraumatic.  Eyes: Conjunctivae are normal. No scleral icterus.  Cardiovascular: Normal rate and regular rhythm.   Pulmonary/Chest: Breath sounds normal. No respiratory distress.  Abdominal: Soft. Bowel sounds are normal. She exhibits mass (gravid uterus). She exhibits no distension. There is tenderness (mild-moderate in RUQ).  Musculoskeletal: Normal range of motion. She exhibits no edema.  Neurological: She is alert and oriented to person, place, and time. No cranial nerve deficit.  Skin: Skin is warm and dry. No erythema.  Psychiatric: Mood, affect and judgment normal.    Condition at Discharge: Stable  Complications affecting treatment: None  Discharge Medications:  PNVs, otherwise none.  [facility]up [facility]  [facility]  Signed: Thomasene MohairStephen Alanson Hausmann, MD 05/14/2017 4:23 PM

## 2017-05-15 ENCOUNTER — Encounter: Payer: Medicaid Other | Admitting: Maternal Newborn

## 2017-05-15 ENCOUNTER — Telehealth: Payer: Self-pay

## 2017-05-15 HISTORY — PX: CHOLECYSTECTOMY: SHX55

## 2017-05-15 NOTE — Telephone Encounter (Signed)
Pharmacy called to report patient Percocet rx was accidentally filled as Norco. Pharmacy notified pt and pt is bringing meds back to pharmacy for the correct rx to be dispensed. Pharmacy unaware yet if patient actually took any of the Norco.

## 2017-05-15 NOTE — Consult Note (Signed)
Patient ID: Jessica StampsDalisha Gruwell, female   DOB: 05-06-1995, 22 y.o.   MRN: [id number]  HPI Jessica Randall is a 22 y.o. female asked by Dr. Jean RosenthalJackson to see in consultation ( d/w him in detail) she is [redacted] weeks pregnant and presented with right upper quadrant pain that is moderate to severe in intensity.  She has been having this intense pains for the last 3 days but it has been present intermittently for the last couple months. No evidence of biliary obstruction.  No fever or chills.  He has been taking Percocet without relief of her pain.  No previous abdominal operations. Ultrasound personally reviewed showing evidence of multiple stones within the gallbladder and a an obstructing stone within the common bile duct.  LFTs showed a increasing alkaline phosphatase 148 and AST of 61.  Calcium of 2.8.  White count of 8, and a lipase.  HPI  Past Medical History:  Diagnosis Date  . Anemia   . Anxiety and depression    history of SI  . History of drug use    MJ  . History of gonorrhea    with G2    Past Surgical History:  Procedure Laterality Date  . NO PAST SURGERIES      Family History  Problem Relation Age of Onset  . Lupus Mother   . Hypertension Mother   . Hyperthyroidism Mother   . Hyperlipidemia Mother   . Hypertension Maternal Grandmother   . Diabetes Maternal Grandmother   . Transient ischemic attack Maternal Grandmother   . Hyperlipidemia Maternal Grandfather   . Breast cancer Paternal Grandmother   . Transient ischemic attack Paternal Grandmother     Social History Social History   Tobacco Use  . Smoking status: Never Smoker  . Smokeless tobacco: Never Used  Substance Use Topics  . Alcohol use: No    Alcohol/week: 0.0 oz    Comment: occ.   . Drug use: No    Comment: h/o THC use    No Known Allergies  No current facility-administered medications for this encounter.    Current Outpatient Medications  Medication Sig Dispense Refill  . clobetasol cream (TEMOVATE) 0.05  % Apply 1 application topically 2 (two) times daily. 30 g 2  . ondansetron (ZOFRAN ODT) 4 MG disintegrating tablet Take 1 tablet (4 mg total) by mouth every 6 (six) hours as needed for nausea. 20 tablet 0  . oxyCODONE-acetaminophen (PERCOCET) 5-325 MG tablet Take 1-2 tablets by mouth every 6 (six) hours as needed for up to 7 days for moderate pain or severe pain (due to gallstones). 30 tablet 0  . potassium chloride SA (K-DUR,KLOR-CON) 20 MEQ tablet Take 1 tablet (20 mEq total) by mouth 2 (two) times daily for 3 days. 6 tablet 0  . Prenatal Vit-Fe Fumarate-FA (MULTIVITAMIN-PRENATAL) 27-0.8 MG TABS tablet Take 1 tablet by mouth daily at 12 noon.    Marland Kitchen. aspirin EC 81 MG tablet Take 1 tablet (81 mg total) by mouth daily. Take after 12 weeks for prevention of preeclampssia later in pregnancy (Patient not taking: Reported on 03/02/2017) 300 tablet 2  . hydrocortisone (PROCTOCORT) 1 % CREA Apply to affected skin 2 times daily (Patient not taking: Reported on 03/02/2017) 28.4 g 0     Review of Systems Full ROS  was asked and was negative except for the information on the HPI  Physical Exam Blood pressure 117/74, pulse 79, temperature 97.8 F (36.6 C), temperature source Axillary, resp. rate 16, height 5\' 1"  (1.549 m),  weight 78.9 kg (174 lb), last menstrual period 09/28/2016, SpO2 100 %, currently breastfeeding. CONSTITUTIONAL: NAD EYES: Pupils are equal, round, and reactive to light, Sclera are non-icteric. EARS, NOSE, MOUTH AND THROAT: The oropharynx is clear. The oral mucosa is pink and moist. Hearing is intact to voice. LYMPH NODES:  Lymph nodes in the neck are normal. RESPIRATORY:  Lungs are clear. There is normal respiratory effort, with equal breath sounds bilaterally, and without pathologic use of accessory muscles. CARDIOVASCULAR: Heart is regular without murmurs, gallops, or rubs. GI: The abdomen is  Soft,TTP RUQ w positive murphy sign.  Gravid uterus GU: Rectal deferred.   MUSCULOSKELETAL:  Normal muscle strength and tone. No cyanosis or edema.   SKIN: Turgor is good and there are no pathologic skin lesions or ulcers. NEUROLOGIC: Motor and sensation is grossly normal. Cranial nerves are grossly intact. PSYCH:  Oriented to person, place and time. Affect is normal.  Data Reviewed  I have personally reviewed the patient's imaging, laboratory findings and medical records.    Assessment/Plan 22 year old female with 2-week pregnant with acute cholecystitis and choledocholithiasis.  Given her clinical picture I do think that an ERCP followed by a laparoscopic cholecystectomy after her common bile duct is cleared is indicated.  Gust this is a complex case and she is in the third trimester as there is significant risk for preterm labor and uterine injuries.  I have discussed with the patient in detail and I do think that the best option for her at this time is to have her manage at a tertiary facility where they can take care of potential life-threatening complications for both mom and the baby. There is no evidence of cholangitis and the patient is not septic and she is stable to be transferred.  Case discussed with Dr. Jean Rosenthal in detail. Please Note that I saw the patient on 05/14/2017 at around 1 PM  Sterling Big, MD FACS General Surgeon 05/15/2017, 12:50 PM

## 2017-05-19 ENCOUNTER — Ambulatory Visit: Payer: Medicaid Other | Admitting: General Surgery

## 2017-05-20 ENCOUNTER — Encounter: Payer: Self-pay | Admitting: *Deleted

## 2017-05-22 ENCOUNTER — Observation Stay
Admission: EM | Admit: 2017-05-22 | Discharge: 2017-05-22 | Disposition: A | Discharge status: Home / Self Care | Payer: Medicaid Other | Attending: Obstetrics & Gynecology | Admitting: Obstetrics & Gynecology

## 2017-05-22 ENCOUNTER — Other Ambulatory Visit: Payer: Self-pay

## 2017-05-22 DIAGNOSIS — O26893 Other specified pregnancy related conditions, third trimester: Secondary | ICD-10-CM | POA: Diagnosis not present

## 2017-05-22 DIAGNOSIS — Z8673 Personal history of transient ischemic attack (TIA), and cerebral infarction without residual deficits: Secondary | ICD-10-CM | POA: Insufficient documentation

## 2017-05-22 DIAGNOSIS — O26613 Liver and biliary tract disorders in pregnancy, third trimester: Secondary | ICD-10-CM

## 2017-05-22 DIAGNOSIS — R1011 Right upper quadrant pain: Secondary | ICD-10-CM | POA: Diagnosis not present

## 2017-05-22 DIAGNOSIS — Z3A33 33 weeks gestation of pregnancy: Secondary | ICD-10-CM | POA: Insufficient documentation

## 2017-05-22 DIAGNOSIS — K8020 Calculus of gallbladder without cholecystitis without obstruction: Secondary | ICD-10-CM

## 2017-05-22 DIAGNOSIS — O0991 Supervision of high risk pregnancy, unspecified, first trimester: Secondary | ICD-10-CM

## 2017-05-22 MED ORDER — ACETAMINOPHEN 325 MG PO TABS: 650.0000 mg | ORAL_TABLET | ORAL | Status: DC | PRN

## 2017-05-22 MED ORDER — ONDANSETRON HCL 4 MG/2ML IJ SOLN: 4.0000 mg | Freq: Four times a day (QID) | INTRAMUSCULAR | Status: DC | PRN

## 2017-05-22 MED ORDER — OXYCODONE-ACETAMINOPHEN 5-325 MG PO TABS
1.0000 | ORAL_TABLET | ORAL | Status: DC | PRN
  Administered 2017-05-22: 1 via ORAL
  Filled 2017-05-22: qty 1

## 2017-05-22 MED ORDER — OXYCODONE-ACETAMINOPHEN 5-325 MG PO TABS: 1.0000 | ORAL_TABLET | ORAL | 0 refills | Status: DC | PRN

## 2017-05-22 NOTE — Discharge Instructions (Signed)
Acetaminophen; Oxycodone tablets What is this medicine? ACETAMINOPHEN; OXYCODONE (a set a MEE noe fen; ox i KOE done) is a pain reliever. It is used to treat moderate to severe pain. This medicine may be used for other purposes; ask your health care provider or pharmacist if you have questions. COMMON BRAND NAME(S): Endocet, Magnacet, Narvox, Percocet, Perloxx, Primalev, Primlev, Roxicet, Xolox What should I tell my health care provider before I take this medicine? They need to know if you have any of these conditions: -brain tumor -Crohn's disease, inflammatory bowel disease, or ulcerative colitis -drug abuse or addiction -head injury -heart or circulation problems -if you often drink alcohol -kidney disease or problems going to the bathroom -liver disease -lung disease, asthma, or breathing problems -an unusual or allergic reaction to acetaminophen, oxycodone, other opioid analgesics, other medicines, foods, dyes, or preservatives -pregnant or trying to get pregnant -breast-feeding How should I use this medicine? Take this medicine by mouth with a full glass of water. Follow the directions on the prescription label. You can take it with or without food. If it upsets your stomach, take it with food. Take your medicine at regular intervals. Do not take it more often than directed. A special MedGuide will be given to you by the pharmacist with each prescription and refill. Be sure to read this information carefully each time. Talk to your pediatrician regarding the use of this medicine in children. Special care may be needed. Overdosage: If you think you have taken too much of this medicine contact a poison control center or emergency room at once. NOTE: This medicine is only for you. Do not share this medicine with others. What if I miss a dose? If you miss a dose, take it as soon as you can. If it is almost time for your next dose, take only that dose. Do not take double or extra  doses. What may interact with this medicine? This medicine may interact with the following medications: -alcohol -antihistamines for allergy, cough and cold -antiviral medicines for HIV or AIDS -atropine -certain antibiotics like clarithromycin, erythromycin, linezolid, rifampin -certain medicines for anxiety or sleep -certain medicines for bladder problems like oxybutynin, tolterodine -certain medicines for depression like amitriptyline, fluoxetine, sertraline -certain medicines for fungal infections like ketoconazole, itraconazole, voriconazole -certain medicines for migraine headache like almotriptan, eletriptan, frovatriptan, naratriptan, rizatriptan, sumatriptan, zolmitriptan -certain medicines for nausea or vomiting like dolasetron, ondansetron, palonosetron -certain medicines for Parkinson's disease like benztropine, trihexyphenidyl -certain medicines for seizures like phenobarbital, phenytoin, primidone -certain medicines for stomach problems like dicyclomine, hyoscyamine -certain medicines for travel sickness like scopolamine -diuretics -general anesthetics like halothane, isoflurane, methoxyflurane, propofol -ipratropium -local anesthetics like lidocaine, pramoxine, tetracaine -MAOIs like Carbex, Eldepryl, Marplan, Nardil, and Parnate -medicines that relax muscles for surgery -methylene blue -nilotinib -other medicines with acetaminophen -other narcotic medicines for pain or cough -phenothiazines like chlorpromazine, mesoridazine, prochlorperazine, thioridazine This list may not describe all possible interactions. Give your health care provider a list of all the medicines, herbs, non-prescription drugs, or dietary supplements you use. Also tell them if you smoke, drink alcohol, or use illegal drugs. Some items may interact with your medicine. What should I watch for while using this medicine? Tell your doctor or health care professional if your pain does not go away, if it  gets worse, or if you have new or a different type of pain. You may develop tolerance to the medicine. Tolerance means that you will need a higher dose of the medication for pain relief.  Tolerance is normal and is expected if you take this medicine for a long time. Do not suddenly stop taking your medicine because you may develop a severe reaction. Your body becomes used to the medicine. This does NOT mean you are addicted. Addiction is a behavior related to getting and using a drug for a non-medical reason. If you have pain, you have a medical reason to take pain medicine. Your doctor will tell you how much medicine to take. If your doctor wants you to stop the medicine, the dose will be slowly lowered over time to avoid any side effects. There are different types of narcotic medicines (opiates). If you take more than one type at the same time or if you are taking another medicine that also causes drowsiness, you may have more side effects. Give your health care provider a list of all medicines you use. Your doctor will tell you how much medicine to take. Do not take more medicine than directed. Call emergency for help if you have problems breathing or unusual sleepiness. Do not take other medicines that contain acetaminophen with this medicine. Always read labels carefully. If you have questions, ask your doctor or pharmacist. If you take too much acetaminophen get medical help right away. Too much acetaminophen can be very dangerous and cause liver damage. Even if you do not have symptoms, it is important to get help right away. You may get drowsy or dizzy. Do not drive, use machinery, or do anything that needs mental alertness until you know how this medicine affects you. Do not stand or sit up quickly, especially if you are an older patient. This reduces the risk of dizzy or fainting spells. Alcohol may interfere with the effect of this medicine. Avoid alcoholic drinks. The medicine will cause  constipation. Try to have a bowel movement at least every 2 to 3 days. If you do not have a bowel movement for 3 days, call your doctor or health care professional. Your mouth may get dry. Chewing sugarless gum or sucking hard candy, and drinking plenty or water may help. Contact your doctor if the problem does not go away or is severe. What side effects may I notice from receiving this medicine? Side effects that you should report to your doctor or health care professional as soon as possible: -allergic reactions like skin rash, itching or hives, swelling of the face, lips, or tongue -breathing problems -confusion -redness, blistering, peeling or loosening of the skin, including inside the mouth -signs and symptoms of liver injury like dark yellow or brown urine; general ill feeling or flu-like symptoms; light-colored stools; loss of appetite; nausea; right upper belly pain; unusually weak or tired; yellowing of the eyes or skin -signs and symptoms of low blood pressure like dizziness; feeling faint or lightheaded, falls; unusually weak or tired -trouble passing urine or change in the amount of urine Side effects that usually do not require medical attention (report to your doctor or health care professional if they continue or are bothersome): -constipation -dry mouth -nausea, vomiting -tiredness This list may not describe all possible side effects. Call your doctor for medical advice about side effects. You may report side effects to FDA at 1-800-FDA-1088. Where should I keep my medicine? Keep out of the reach of children. This medicine can be abused. Keep your medicine in a safe place to protect it from theft. Do not share this medicine with anyone. Selling or giving away this medicine is dangerous and against the law. This  medicine may cause accidental overdose and death if it taken by other adults, children, or pets. Mix any unused medicine with a substance like cat litter or coffee grounds.  Then throw the medicine away in a sealed container like a sealed bag or a coffee can with a lid. Do not use the medicine after the expiration date. Store at room temperature between 20 and 25 degrees C (68 and 77 degrees F). NOTE: This sheet is a summary. It may not cover all possible information. If you have questions about this medicine, talk to your doctor, pharmacist, or health care provider.  2018 Elsevier/Gold Standard (2014-10-16 21:48:12)

## 2017-05-22 NOTE — Discharge Summary (Signed)
  See FPN 

## 2017-05-22 NOTE — OB Triage Note (Addendum)
Patient presented to L&D with complaints of decreased fetal movement since last night and lower right sided abdominal pain. Denies leaking of fluid or vaginal bleeding. Says her abdominal pain started yesterday afternoon.  Patient states she [facility].  She took tylenol last night and was able to go to sleep but hasn't taken anything for pain today. Rating her pain 7-8/10.  Says the tylenol doesn't help very much. Denies nausea, vomiting or diarrhea and says she had a bowel movement today/ is passing gas.  [facility].

## 2017-05-22 NOTE — Final Progress Note (Signed)
Physician Final Progress Note  Patient ID: Jessica Randall MRN: [id number] DOB/AGE: Jul 23, 1995 22 y.o.  Admit date: 05/22/2017 Admitting provider: Nadara Mustard, MD Discharge date: 05/22/2017  Admission Diagnoses: RUQ Pain.    Discharge Diagnoses:  Principal Problem:   RUQ pain   Post Op form Cholecystectomy  Consults: None  Significant Findings/ Diagnostic Studies:   HPI:  22 y.o. [id number] @ [redacted]w[redacted]d (07/07/2017, by Ultrasound). Admitted on 05/22/2017:   Patient Active Problem List   Diagnosis Date Noted  . RUQ pain 05/22/2017  . Hypokalemia 05/12/2017  . Indication for care in labor and delivery, antepartum 05/12/2017  . Cholelithiasis affecting pregnancy in third trimester, antepartum 05/12/2017  . Pain in symphysis pubis during pregnancy 01/30/2017  . Supervision of high risk pregnancy, antepartum, first trimester 11/12/2016  . TIA (transient ischemic attack) 03/28/2015  . Stroke-like symptoms 03/28/2015  . Hypertension 03/28/2015  . Anemia 03/28/2015  . Postpartum complication     Presents for worsening RUQ pain today; had Lap Cholecystectomy one week ago at [facility], and is currently [redacted] weeks pregnant.  Denies nausea, vomiting, or other assoc sx's.  Had first BM since surgery today.  Pain is RUQ, no radiation, no other sx's, Tylenol helped a little.   Prenatal care at: at [facility]. Pregnancy complicated by none.  ROS: A review of systems was performed and negative, except as stated in the above HPI. PMHx:  Past Medical History:  Diagnosis Date  . Anemia   . Anxiety and depression    history of SI  . History of drug use    MJ  . History of gonorrhea    with G2   PSHx:  Past Surgical History:  Procedure Laterality Date  . CHOLECYSTECTOMY Right 05/15/2017  . NO PAST SURGERIES     Medications:  Medications Prior to Admission  Medication Sig Dispense Refill Last Dose  . Prenatal Vit-Fe Fumarate-FA (MULTIVITAMIN-PRENATAL) 27-0.8 MG TABS tablet Take 1 tablet by  mouth daily at 12 noon.   05/22/2017  . aspirin EC 81 MG tablet Take 1 tablet (81 mg total) by mouth daily. Take after 12 weeks for prevention of preeclampssia later in pregnancy (Patient not taking: Reported on 03/02/2017) 300 tablet 2 Unknown at Unknown time  . clobetasol cream (TEMOVATE) 0.05 % Apply 1 application topically 2 (two) times daily. (Patient not taking: Reported on 05/22/2017) 30 g 2 Unknown at Unknown time  . hydrocortisone (PROCTOCORT) 1 % CREA Apply to affected skin 2 times daily (Patient not taking: Reported on 03/02/2017) 28.4 g 0 Unknown at Unknown time  . ondansetron (ZOFRAN ODT) 4 MG disintegrating tablet Take 1 tablet (4 mg total) by mouth every 6 (six) hours as needed for nausea. (Patient not taking: Reported on 05/22/2017) 20 tablet 0 Unknown at Unknown time  . potassium chloride SA (K-DUR,KLOR-CON) 20 MEQ tablet Take 1 tablet (20 mEq total) by mouth 2 (two) times daily for 3 days. 6 tablet 0 05/13/2017 at 2200   Allergies: has No Known Allergies. OBHx:  OB History  Gravida Para Term Preterm AB Living  3 2 2  0 0 2  SAB TAB Ectopic Multiple Live Births          2    # Outcome Date GA Lbr Len/2nd Weight Sex Delivery Anes PTL Lv  3 Current           2 Term 03/21/15 [redacted]w[redacted]d  6 lb 13 oz (3.09 kg) M Vag-Spont  N LIV  1 Term 01/24/12 [redacted]w[redacted]d  7  lb (3.175 kg) M Vag-Spont  N LIV    Obstetric Comments  01/2012: 7lbs 3oz (Dr. Tiburcio Pea, [facility]) median episotomy; type of tear not documented.    WRU:[id number] except as detailed in HPI.Marland Kitchen  No family history of birth defects. Soc Hx: Alcohol: none and Recreational drug use: none  Objective:   Vitals:   05/22/17 1628  BP: (!) 108/55  Pulse: 97  Resp: 17  Temp: 98.7 F (37.1 C)   Constitutional: Well nourished, well developed female in no acute distress.  HEENT: normal Skin: Warm and dry.  Cardiovascular:Regular rate and rhythm.   Extremity: trace to 1+ bilateral pedal edema Respiratory: Clear to auscultation  bilateral. Normal respiratory effort Abdomen: Mild RUQ pain, no lower quadrant pain, ND, NABS, Gravid, + FM, + FHT Back: no CVAT Neuro: DTRs 2+, Cranial nerves grossly intact Psych: Alert and Oriented x3. No memory deficits. Normal mood and affect.  MS: normal gait, normal bilateral lower extremity ROM/strength/stability.   Perinatal info:  Blood type: A positive Rubella- Immune Varicella -Immune TDaP tetanus status unknown to the patient, Given during third trimester of this pregnancy RPR NR / HIV Neg/ HBsAg Neg   Assessment & Plan:   22 y.o. [id number] @ [redacted]w[redacted]d, Admitted on 05/22/2017: RUQ PAIN. Pt has had prior Lap chole, exam is not acute, and inc healing well.  No s/sx internal bleeding, sepsis, infection, or GI distutbance. Will treat w pain meds (percocet) and see if improves over time.   Next option is general surgery consult.    Procedures: A NST procedure was performed with FHR monitoring and a normal baseline established, appropriate time of 20-40 minutes of evaluation, and accels >2 seen w 15x15 characteristics.  Results show a REACTIVE NST.   Discharge Condition: good  Disposition: Discharge disposition: 01-Home or Self Care       Diet: Regular diet  Discharge Activity: Activity as tolerated  Discharge Instructions    Call MD for:   Complete by:  As directed    Worsening contractions or pain; leakage of fluid; bleeding.   Diet general   Complete by:  As directed    Increase activity slowly   Complete by:  As directed      Allergies as of 05/22/2017   No Known Allergies     Medication List    TAKE these medications   aspirin EC 81 MG tablet Take 1 tablet (81 mg total) by mouth daily. Take after 12 weeks for prevention of preeclampssia later in pregnancy   clobetasol cream 0.05 % Commonly known as:  TEMOVATE Apply 1 application topically 2 (two) times daily.   hydrocortisone 1 % Crea Commonly known as:  PROCTOCORT Apply to affected skin 2 times  daily   multivitamin-prenatal 27-0.8 MG Tabs tablet Take 1 tablet by mouth daily at 12 noon.   ondansetron 4 MG disintegrating tablet Commonly known as:  ZOFRAN ODT Take 1 tablet (4 mg total) by mouth every 6 (six) hours as needed for nausea.   oxyCODONE-acetaminophen 5-325 MG tablet Commonly known as:  PERCOCET/ROXICET Take 1-2 tablets by mouth every 3 (three) hours as needed for moderate pain.   potassium chloride SA 20 MEQ tablet Commonly known as:  K-DUR,KLOR-CON Take 1 tablet (20 mEq total) by mouth 2 (two) times daily for 3 days.      Follow-up Information    Nadara Mustard, MD Follow up.   Specialty:  Obstetrics and Gynecology Why:  [facility] next week Contact information: [address]  [place] [address] [phone]           Total time spent taking care of this patient: 15 minutes  Signed: Letitia Libraobert Paul Amin Randall 05/22/2017, 5:51 PM

## 2017-05-23 ENCOUNTER — Emergency Department
Admission: EM | Admit: 2017-05-23 | Discharge: 2017-05-23 | Disposition: A | Discharge status: Other Acute Inpatient Hospital | Payer: Medicaid Other | Attending: Emergency Medicine | Admitting: Emergency Medicine

## 2017-05-23 ENCOUNTER — Encounter: Payer: Self-pay | Admitting: Emergency Medicine

## 2017-05-23 ENCOUNTER — Other Ambulatory Visit: Payer: Self-pay

## 2017-05-23 DIAGNOSIS — G8918 Other acute postprocedural pain: Secondary | ICD-10-CM | POA: Insufficient documentation

## 2017-05-23 DIAGNOSIS — I10 Essential (primary) hypertension: Secondary | ICD-10-CM | POA: Diagnosis not present

## 2017-05-23 DIAGNOSIS — Z3A32 32 weeks gestation of pregnancy: Secondary | ICD-10-CM | POA: Insufficient documentation

## 2017-05-23 DIAGNOSIS — Z79899 Other long term (current) drug therapy: Secondary | ICD-10-CM | POA: Insufficient documentation

## 2017-05-23 DIAGNOSIS — O9989 Other specified diseases and conditions complicating pregnancy, childbirth and the puerperium: Secondary | ICD-10-CM | POA: Diagnosis not present

## 2017-05-23 DIAGNOSIS — R1011 Right upper quadrant pain: Secondary | ICD-10-CM

## 2017-05-23 DIAGNOSIS — Z9049 Acquired absence of other specified parts of digestive tract: Secondary | ICD-10-CM | POA: Diagnosis not present

## 2017-05-23 DIAGNOSIS — Z8673 Personal history of transient ischemic attack (TIA), and cerebral infarction without residual deficits: Secondary | ICD-10-CM | POA: Diagnosis not present

## 2017-05-23 LAB — URINALYSIS, COMPLETE (UACMP) WITH MICROSCOPIC
Bilirubin Urine: NEGATIVE
Glucose, UA: NEGATIVE mg/dL
Hgb urine dipstick: NEGATIVE
Ketones, ur: NEGATIVE mg/dL
Leukocytes, UA: NEGATIVE
Nitrite: NEGATIVE
PROTEIN: NEGATIVE mg/dL
Specific Gravity, Urine: 1.016 (ref 1.005–1.030)
pH: 7 (ref 5.0–8.0)

## 2017-05-23 LAB — CBC WITH DIFFERENTIAL/PLATELET
Basophils Absolute: 0.1 10*3/uL (ref 0–0.1)
Basophils Relative: 0 %
Eosinophils Absolute: 0 10*3/uL (ref 0–0.7)
Eosinophils Relative: 0 %
HEMATOCRIT: 28.7 % — AB (ref 35.0–47.0)
HEMOGLOBIN: 9.4 g/dL — AB (ref 12.0–16.0)
Lymphocytes Relative: 9 %
Lymphs Abs: 1.1 10*3/uL (ref 1.0–3.6)
MCH: 25.1 pg — AB (ref 26.0–34.0)
MCHC: 32.9 g/dL (ref 32.0–36.0)
MCV: 76.3 fL — ABNORMAL LOW (ref 80.0–100.0)
MONO ABS: 0.9 10*3/uL (ref 0.2–0.9)
MONOS PCT: 8 %
NEUTROS ABS: 9.7 10*3/uL — AB (ref 1.4–6.5)
NEUTROS PCT: 83 %
Platelets: 267 10*3/uL (ref 150–440)
RBC: 3.76 MIL/uL — ABNORMAL LOW (ref 3.80–5.20)
RDW: 15.3 % — ABNORMAL HIGH (ref 11.5–14.5)
WBC: 11.8 10*3/uL — ABNORMAL HIGH (ref 3.6–11.0)

## 2017-05-23 LAB — COMPREHENSIVE METABOLIC PANEL
ALBUMIN: 2.8 g/dL — AB (ref 3.5–5.0)
ALT: 13 U/L — AB (ref 14–54)
ANION GAP: 7 (ref 5–15)
AST: 16 U/L (ref 15–41)
Alkaline Phosphatase: 128 U/L — ABNORMAL HIGH (ref 38–126)
BUN: <5 mg/dL — ABNORMAL LOW (ref 6–20)
CHLORIDE: 107 mmol/L (ref 101–111)
CO2: 21 mmol/L — ABNORMAL LOW (ref 22–32)
CREATININE: 0.59 mg/dL (ref 0.44–1.00)
Calcium: 8.6 mg/dL — ABNORMAL LOW (ref 8.9–10.3)
GFR calc non Af Amer: >60 mL/min (ref >60)
GLUCOSE: 103 mg/dL — AB (ref 65–99)
Potassium: 3.1 mmol/L — ABNORMAL LOW (ref 3.5–5.1)
Sodium: 135 mmol/L (ref 135–145)
Total Bilirubin: 0.4 mg/dL (ref 0.3–1.2)
Total Protein: 6.9 g/dL (ref 6.5–8.1)

## 2017-05-23 LAB — LIPASE, BLOOD: Lipase: 24 U/L (ref 11–51)

## 2017-05-23 MED ORDER — MORPHINE SULFATE (PF) 4 MG/ML IV SOLN
4.0000 mg | Freq: Once | INTRAVENOUS | Status: AC
  Administered 2017-05-23: 4 mg via INTRAVENOUS
  Filled 2017-05-23: qty 1

## 2017-05-23 MED ORDER — SODIUM CHLORIDE 0.9 % IV BOLUS
1000.0000 mL | Freq: Once | INTRAVENOUS | Status: AC
  Administered 2017-05-23: 1000 mL via INTRAVENOUS

## 2017-05-23 NOTE — ED Provider Notes (Signed)
[facility] Emergency Department Provider Note ____________________________________________   First MD Initiated Contact with Patient 05/23/17 1027     (approximate)  I have reviewed the triage vital signs and the nursing notes.   HISTORY  Chief Complaint Post-op Problem    HPI Jessica Randall is a 22 y.o. female with PMH as noted below who is [redacted] weeks pregnant and status post cholecystectomy 1 week ago who presents with right upper quadrant pain, persistent since the surgery but worse in the last 3 days, constant but intermittently becoming more severe, and nonradiating.  Patient states that the remainder of her abdomen is nontender.  She has no vaginal bleeding or leakage of fluid, and feels the baby moving normally.  She denies nausea or vomiting, and states she has been having normal bowel movements and passing gas.  She is on Percocet at home for pain.  The patient was seen yesterday for abdominal pain in L&D triage, and had normal observation, however she states that the right upper quadrant pain has persisted.  She called [facility] (where the surgery was done) and was told to come in to be evaluated, but states she could not get there on her own.  Past Medical History:  Diagnosis Date  . Anemia   . Anxiety and depression    history of SI  . History of drug use    MJ  . History of gonorrhea    with G2    Patient Active Problem List   Diagnosis Date Noted  . RUQ pain 05/22/2017  . Hypokalemia 05/12/2017  . Indication for care in labor and delivery, antepartum 05/12/2017  . Cholelithiasis affecting pregnancy in third trimester, antepartum 05/12/2017  . Pain in symphysis pubis during pregnancy 01/30/2017  . Supervision of high risk pregnancy, antepartum, first trimester 11/12/2016  . TIA (transient ischemic attack) 03/28/2015  . Stroke-like symptoms 03/28/2015  . Hypertension 03/28/2015  . Anemia 03/28/2015  . Postpartum complication     Past  Surgical History:  Procedure Laterality Date  . CHOLECYSTECTOMY Right 05/15/2017  . NO PAST SURGERIES      Prior to Admission medications   Medication Sig Start Date End Date Taking? Authorizing Provider  hydrocortisone (PROCTOCORT) 1 % CREA Apply to affected skin 2 times daily 02/20/17  Yes Tresea Mall, CNM  ondansetron (ZOFRAN ODT) 4 MG disintegrating tablet Take 1 tablet (4 mg total) by mouth every 6 (six) hours as needed for nausea. 05/12/17  Yes [name], CNM  oxyCODONE-acetaminophen (PERCOCET/ROXICET) 5-325 MG tablet Take 1-2 tablets by mouth every 3 (three) hours as needed for moderate pain. 05/22/17  Yes Nadara Mustard, MD  Prenatal Vit-Fe Fumarate-FA (MULTIVITAMIN-PRENATAL) 27-0.8 MG TABS tablet Take 1 tablet by mouth daily at 12 noon.   Yes [provider]  aspirin EC 81 MG tablet Take 1 tablet (81 mg total) by mouth daily. Take after 12 weeks for prevention of preeclampssia later in pregnancy Patient not taking: Reported on 03/02/2017 01/30/17   Oswaldo Conroy, CNM  clobetasol cream (TEMOVATE) 0.05 % Apply 1 application topically 2 (two) times daily. Patient not taking: Reported on 05/23/2017 05/01/17   Tresea Mall, CNM  potassium chloride SA (K-DUR,KLOR-CON) 20 MEQ tablet Take 1 tablet (20 mEq total) by mouth 2 (two) times daily for 3 days. Patient not taking: Reported on 05/23/2017 05/12/17 05/15/17  [name], CNM    Allergies Patient has no known allergies.  Family History  Problem Relation Age of Onset  . Lupus Mother   .  Hypertension Mother   . Hyperthyroidism Mother   . Hyperlipidemia Mother   . Hypertension Maternal Grandmother   . Diabetes Maternal Grandmother   . Transient ischemic attack Maternal Grandmother   . Hyperlipidemia Maternal Grandfather   . Breast cancer Paternal Grandmother   . Transient ischemic attack Paternal Grandmother     Social History Social History   Tobacco Use  . Smoking status: Never Smoker  .  Smokeless tobacco: Never Used  Substance Use Topics  . Alcohol use: No    Alcohol/week: 0.0 oz    Comment: occ.   . Drug use: No    Comment: h/o THC use    Review of Systems  Constitutional: No fever. Eyes: No redness. ENT: No sore throat. Cardiovascular: Denies chest pain. Respiratory: Denies shortness of breath. Gastrointestinal: Positive for abdominal pain.  Genitourinary: Negative for dysuria or flank pain.  Musculoskeletal: Negative for back pain. Skin: Negative for rash. Neurological: Negative for headache.   ____________________________________________   PHYSICAL EXAM:  VITAL SIGNS: ED Triage Vitals  Enc Vitals Group     BP 05/23/17 1016 120/69     Pulse Rate 05/23/17 1016 90     Resp 05/23/17 1016 16     Temp 05/23/17 1018 98.8 F (37.1 C)     Temp src --      SpO2 05/23/17 1016 98 %     Weight 05/23/17 1018 168 lb (76.2 kg)     Height 05/23/17 1018 5\' 1"  (1.549 m)     Head Circumference --      Peak Flow --      Pain Score 05/23/17 1016 10     Pain Loc --      Pain Edu? --      Excl. in GC? --     Constitutional: Alert and oriented. Well appearing and in no acute distress. Eyes: Conjunctivae are normal.  No scleral icterus. Head: Atraumatic. Nose: No congestion/rhinnorhea. Mouth/Throat: Mucous membranes are moist.   Neck: Normal range of motion.  Cardiovascular:  Good peripheral circulation. Respiratory: Normal respiratory effort.  No retractions.  Gastrointestinal: Soft with mild right upper quadrant tenderness.  Incision sites C/D/I.   Genitourinary: No flank tenderness. Musculoskeletal: No lower extremity edema.  Extremities warm and well perfused.  Neurologic:  Normal speech and language. No gross focal neurologic deficits are appreciated.  Skin:  Skin is warm and dry. No rash noted. Psychiatric: Mood and affect are normal. Speech and behavior are normal.  ____________________________________________   LABS (all labs ordered are listed,  but only abnormal results are displayed)  Labs Reviewed  COMPREHENSIVE METABOLIC PANEL - Abnormal; Notable for the following components:      Result Value   Potassium 3.1 (*)    CO2 21 (*)    Glucose, Bld 103 (*)    BUN <5 (*)    Calcium 8.6 (*)    Albumin 2.8 (*)    ALT 13 (*)    Alkaline Phosphatase 128 (*)    All other components within normal limits  CBC WITH DIFFERENTIAL/PLATELET - Abnormal; Notable for the following components:   WBC 11.8 (*)    RBC 3.76 (*)    Hemoglobin 9.4 (*)    HCT 28.7 (*)    MCV 76.3 (*)    MCH 25.1 (*)    RDW 15.3 (*)    Neutro Abs 9.7 (*)    All other components within normal limits  URINALYSIS, COMPLETE (UACMP) WITH MICROSCOPIC - Abnormal; Notable for the following  components:   Color, Urine YELLOW (*)    APPearance CLEAR (*)    Squamous Epithelial / LPF 0-5 (*)    All other components within normal limits  LIPASE, BLOOD   ____________________________________________  EKG   ____________________________________________  RADIOLOGY    ____________________________________________   PROCEDURES  Procedure(s) performed: No  Procedures  Critical Care performed: No ____________________________________________   INITIAL IMPRESSION / ASSESSMENT AND PLAN / ED COURSE  Pertinent labs & imaging results that were available during my care of the patient were reviewed by me and considered in my medical decision making (see chart for details).  22 year old female G3 P2 at 32 weeks and status post cholecystectomy at [facility] 1 week ago presents with persistent right upper quadrant abdominal pain over the last 3 days.  I reviewed the past medical records in Epic; patient was seen at L&D yesterday and fetal monitoring.  I am unable to access the records from [facility].  The patient states she called D&C today, and was told to come in to be evaluated but could not get there and the ambulance brought her here.  On exam, the patient is well-appearing, vital  signs are normal, there is mild tenderness in the right upper quadrant, but the remainder the exam is unremarkable.  Overall presentation is most consistent with postoperative pain.  The patient is not constipated or obstipated, and is not vomiting, so there is no evidence of SBO or ileus.  She does not appear jaundiced, and I have low suspicion for other postsurgical complication.  Given the monitoring performed yesterday, her presentation also does not appear to be consistent with pregnancy related complication.  Plan: IV fluids, analgesia, labs, and then I will consult surgery at [facility] for further recommendations.    ----------------------------------------- 12:09 PM on 05/23/2017 -----------------------------------------  Lab work-up is unremarkable.  Patient's pain has been controlled with 1 dose of IV morphine.  I consulted Dr. Inda Castle from surgery at [facility], who recommended transfer to [facility] ED to ED for likely ultrasound and further evaluation.  ED accepting physician is Dr. Doreene Nest. The patient agrees with this plan.  ----------------------------------------- 1:34 PM on 05/23/2017 -----------------------------------------  EMS is here for patient transfer.  Patient has remained comfortable in the ED and is stable for transfer.  ____________________________________________   FINAL CLINICAL IMPRESSION(S) / ED DIAGNOSES  Final diagnoses:  Right upper quadrant pain      NEW MEDICATIONS STARTED DURING THIS VISIT:  New Prescriptions   No medications on file     Note:  This document was prepared using Dragon voice recognition software and may include unintentional dictation errors.    Dionne Bucy, MD 05/23/17 1335

## 2017-05-23 NOTE — ED Notes (Signed)
Pt attempting to collect urine specimen at this time.

## 2017-05-23 NOTE — ED Notes (Signed)
emtala reviewed by this RN 

## 2017-05-23 NOTE — ED Triage Notes (Signed)
Pt arrives via [facility] with c/o surgical pain. Pt [facility]. Was seen here yesterday for pain. Per pt, we gave pain meds and sent her to L&D to monitor baby. Pt states last night, she began getting nauseous and dizzy. Pt called surgeon today and they told her to come in for a follow-up but pt states she could not get there, so she came to our ED.

## 2017-05-25 ENCOUNTER — Telehealth: Payer: Self-pay

## 2017-05-25 NOTE — Telephone Encounter (Signed)
Called pt to see how she was and returning phone call as she went to the er over the weekend for abd pain, pressure and tightness

## 2017-05-26 ENCOUNTER — Emergency Department
Admission: EM | Admit: 2017-05-26 | Discharge: 2017-05-26 | Disposition: A | Discharge status: Home / Self Care | Payer: No Typology Code available for payment source | Attending: Emergency Medicine | Admitting: Emergency Medicine

## 2017-05-26 ENCOUNTER — Encounter: Payer: Self-pay | Admitting: Emergency Medicine

## 2017-05-26 DIAGNOSIS — Y9389 Activity, other specified: Secondary | ICD-10-CM | POA: Diagnosis not present

## 2017-05-26 DIAGNOSIS — Z79899 Other long term (current) drug therapy: Secondary | ICD-10-CM | POA: Insufficient documentation

## 2017-05-26 DIAGNOSIS — Y999 Unspecified external cause status: Secondary | ICD-10-CM | POA: Diagnosis not present

## 2017-05-26 DIAGNOSIS — O9989 Other specified diseases and conditions complicating pregnancy, childbirth and the puerperium: Secondary | ICD-10-CM | POA: Insufficient documentation

## 2017-05-26 DIAGNOSIS — Y92481 Parking lot as the place of occurrence of the external cause: Secondary | ICD-10-CM | POA: Insufficient documentation

## 2017-05-26 DIAGNOSIS — I10 Essential (primary) hypertension: Secondary | ICD-10-CM | POA: Insufficient documentation

## 2017-05-26 DIAGNOSIS — G44209 Tension-type headache, unspecified, not intractable: Secondary | ICD-10-CM | POA: Insufficient documentation

## 2017-05-26 DIAGNOSIS — R51 Headache: Secondary | ICD-10-CM | POA: Diagnosis present

## 2017-05-26 DIAGNOSIS — Z3A33 33 weeks gestation of pregnancy: Secondary | ICD-10-CM | POA: Diagnosis not present

## 2017-05-26 MED ORDER — ACETAMINOPHEN 325 MG PO TABS
650.0000 mg | ORAL_TABLET | Freq: Once | ORAL | Status: AC
  Administered 2017-05-26: 650 mg via ORAL
  Filled 2017-05-26: qty 2
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
## 2017-05-26 NOTE — ED Notes (Signed)
Pt. Verbalizes understanding of d/c instructions, medications, and follow-up. VS stable.  Pt. In NAD at time of d/c and denies further concerns regarding this visit. Pt. Stable at the time of departure from the unit, departing unit by the safest and most appropriate manner per that pt condition and limitations with all belongings accounted for. Pt advised to return to the ED at any time for emergent concerns, or for new/worsening symptoms.   
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
## 2017-05-27 ENCOUNTER — Encounter: Payer: Medicaid Other | Admitting: Advanced Practice Midwife

## 2017-05-28 ENCOUNTER — Encounter: Payer: Self-pay | Admitting: Emergency Medicine

## 2017-05-28 ENCOUNTER — Emergency Department
Admission: EM | Admit: 2017-05-28 | Discharge: 2017-05-28 | Disposition: A | Discharge status: Home / Self Care | Payer: No Typology Code available for payment source | Attending: Emergency Medicine | Admitting: Emergency Medicine

## 2017-05-28 DIAGNOSIS — Z3A34 34 weeks gestation of pregnancy: Secondary | ICD-10-CM | POA: Insufficient documentation

## 2017-05-28 DIAGNOSIS — O9989 Other specified diseases and conditions complicating pregnancy, childbirth and the puerperium: Secondary | ICD-10-CM | POA: Diagnosis present

## 2017-05-28 DIAGNOSIS — Y999 Unspecified external cause status: Secondary | ICD-10-CM | POA: Diagnosis not present

## 2017-05-28 DIAGNOSIS — S233XXA Sprain of ligaments of thoracic spine, initial encounter: Secondary | ICD-10-CM | POA: Insufficient documentation

## 2017-05-28 DIAGNOSIS — Y939 Activity, unspecified: Secondary | ICD-10-CM | POA: Diagnosis not present

## 2017-05-28 DIAGNOSIS — S29019A Strain of muscle and tendon of unspecified wall of thorax, initial encounter: Secondary | ICD-10-CM

## 2017-05-28 DIAGNOSIS — Y929 Unspecified place or not applicable: Secondary | ICD-10-CM | POA: Diagnosis not present

## 2017-05-28 MED ORDER — CYCLOBENZAPRINE HCL 5 MG PO TABS: 5.0000 mg | ORAL_TABLET | Freq: Three times a day (TID) | ORAL | 0 refills | Status: DC | PRN
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
## 2017-06-03 ENCOUNTER — Ambulatory Visit (INDEPENDENT_AMBULATORY_CARE_PROVIDER_SITE_OTHER): Payer: Medicaid Other | Admitting: Advanced Practice Midwife

## 2017-06-03 ENCOUNTER — Encounter: Payer: Self-pay | Admitting: Advanced Practice Midwife

## 2017-06-03 VITALS — BP 100/60 | Wt 174.0 lb

## 2017-06-03 DIAGNOSIS — L309 Dermatitis, unspecified: Secondary | ICD-10-CM

## 2017-06-03 DIAGNOSIS — Z3A35 35 weeks gestation of pregnancy: Secondary | ICD-10-CM

## 2017-06-03 MED ORDER — CLOBETASOL PROPIONATE 0.05 % EX CREA: 1.0000 "application " | TOPICAL_CREAM | Freq: Two times a day (BID) | CUTANEOUS | 2 refills | Status: DC

## 2017-06-03 NOTE — Progress Notes (Signed)
Routine Prenatal Care Visit  Subjective  Jessica Randall is a 22 y.o. G3P2002 at [redacted]w[redacted]d being seen today for ongoing prenatal care.  She is currently monitored for the following issues for this high-risk pregnancy and has TIA (transient ischemic attack); Stroke-like symptoms; Hypertension; Hypokalemia; Anemia; Postpartum complication; Supervision of high risk pregnancy, antepartum, first trimester; Pain in symphysis pubis during pregnancy; Indication for care in labor and delivery, antepartum; Cholelithiasis affecting pregnancy in third trimester, antepartum; and RUQ pain on their problem list.  ----------------------------------------------------------------------------------- Patient reports eczema- requesting refill of clobetasol.  She feels contractions every day but there is no regularity. Contractions: Not present. Vag. Bleeding: None.  Movement: Present. Denies leaking of fluid.  ----------------------------------------------------------------------------------- The following portions of the patient's history were reviewed and updated as appropriate: allergies, current medications, past family history, past medical history, past social history, past surgical history and problem list. Problem list updated.   Objective  Blood pressure 100/60, weight 174 lb (78.9 kg), last menstrual period 09/28/2016, currently breastfeeding. Pregravid weight 184 lb (83.5 kg) Total Weight Gain -10 lb (-4.536 kg) Urinalysis: Urine Protein: Negative Urine Glucose: Negative  Fetal Status: Fetal Heart Rate (bpm): 135 Fundal Height: 36 cm Movement: Present     General:  Alert, oriented and cooperative. Patient is in no acute distress.  Skin: Skin is warm and dry. No rash noted.   Cardiovascular: Normal heart rate noted  Respiratory: Normal respiratory effort, no problems with respiration noted  Abdomen: Soft, gravid, appropriate for gestational age. Pain/Pressure: Present     Pelvic:  Cervical exam deferred         Extremities: Normal range of motion.     Mental Status: Normal mood and affect. Normal behavior. Normal judgment and thought content.   Assessment   22 y.o. [id number] at [redacted]w[redacted]d by  07/07/2017, by Ultrasound presenting for routine prenatal visit  Plan   pregnancy #3 Problems (from 11/12/16 to present)    Problem Noted Resolved   Cholelithiasis affecting pregnancy in third trimester, antepartum 05/12/2017 by [name], CNM No   Supervision of high risk pregnancy, antepartum, first trimester 11/12/2016 by Oswaldo Conroy, CNM No   Overview Addendum 05/12/2017 10:59 AM by [name], CNM    Clinic [facility] Prenatal Labs  Dating 7wk1d ultrasound Blood type: A/Positive/-- (10/10 1526)   Genetic Screen 1 Screen:    AFP:     Quad:     NIPS: Antibody:Negative (10/10 1526)  Anatomic [place]  Rubella: 2.06 (10/10 1526) Varicella:    GTT Early:               Third trimester:  RPR: Non Reactive (10/10 1526)   Rhogam  HBsAg: Negative (10/10 1526)   TDaP vaccine                       Flu Shot: HIV: Non Reactive (10/10 1526)   Baby Food                                GBS:   Contraception  Pap:  CBB     CS/VBAC NA   Support Person Mom Monica                 Preterm labor symptoms and general obstetric precautions including but not limited to vaginal bleeding, contractions, leaking of fluid and fetal movement were reviewed in detail with the patient.   Return in about 1 week (  around 06/10/2017) for rob.  Tresea Mall, CNM 06/03/2017 11:40 AM

## 2017-06-07 ENCOUNTER — Other Ambulatory Visit: Payer: Self-pay

## 2017-06-07 ENCOUNTER — Observation Stay
Admission: EM | Admit: 2017-06-07 | Discharge: 2017-06-07 | Disposition: A | Discharge status: Home / Self Care | Payer: Medicaid Other | Attending: Obstetrics & Gynecology | Admitting: Obstetrics & Gynecology

## 2017-06-07 DIAGNOSIS — O0991 Supervision of high risk pregnancy, unspecified, first trimester: Secondary | ICD-10-CM

## 2017-06-07 DIAGNOSIS — R109 Unspecified abdominal pain: Secondary | ICD-10-CM | POA: Diagnosis not present

## 2017-06-07 DIAGNOSIS — K8020 Calculus of gallbladder without cholecystitis without obstruction: Secondary | ICD-10-CM

## 2017-06-07 DIAGNOSIS — Z3A35 35 weeks gestation of pregnancy: Secondary | ICD-10-CM | POA: Insufficient documentation

## 2017-06-07 DIAGNOSIS — O26613 Liver and biliary tract disorders in pregnancy, third trimester: Secondary | ICD-10-CM

## 2017-06-07 DIAGNOSIS — O26893 Other specified pregnancy related conditions, third trimester: Principal | ICD-10-CM | POA: Insufficient documentation

## 2017-06-07 DIAGNOSIS — R102 Pelvic and perineal pain: Secondary | ICD-10-CM | POA: Insufficient documentation

## 2017-06-07 NOTE — OB Triage Note (Signed)
Patient here for complaints of vaginal pressure that she rates a 10 of 10 pain resolves when laying down, get worse when standing or walking around. This pain started yesterday. She denies LOF or bleeding, reports irregular contractions.

## 2017-06-08 NOTE — Discharge Summary (Signed)
  See FPN 

## 2017-06-08 NOTE — Final Progress Note (Signed)
Physician Final Progress Note  Patient ID: Jessica Randall MRN: [id number] DOB/AGE: 05-13-1995 22 y.o.  Admit date: 06/07/2017 Admitting provider: Nadara Mustard, MD Discharge date: 06/08/2017  Admission Diagnoses: Abdominal pain affecting pregnancy; Vaginal Pain, 35 weeks  Discharge Diagnoses: Same  Consults: None  Significant Findings/ Diagnostic Studies: Seen in triage on L&D, no VB ROM or Ctxs.  No s/sx PTL.  Procedures: A NST procedure was performed with FHR monitoring and a normal baseline established, appropriate time of 20-40 minutes of evaluation, and accels >2 seen w 15x15 characteristics.  Results show a REACTIVE NST.   Discharge Condition: good  Disposition: Home  Diet: Regular diet  Discharge Activity: Activity as tolerated   Allergies as of 06/07/2017   No Known Allergies     Medication List    ASK your doctor about these medications   aspirin EC 81 MG tablet Take 1 tablet (81 mg total) by mouth daily. Take after 12 weeks for prevention of preeclampssia later in pregnancy   clobetasol cream 0.05 % Commonly known as:  TEMOVATE Apply 1 application topically 2 (two) times daily.   hydrocortisone 1 % Crea Commonly known as:  PROCTOCORT Apply to affected skin 2 times daily   multivitamin-prenatal 27-0.8 MG Tabs tablet Take 1 tablet by mouth daily at 12 noon.   ondansetron 4 MG disintegrating tablet Commonly known as:  ZOFRAN ODT Take 1 tablet (4 mg total) by mouth every 6 (six) hours as needed for nausea.   potassium chloride SA 20 MEQ tablet Commonly known as:  K-DUR,KLOR-CON Take 1 tablet (20 mEq total) by mouth 2 (two) times daily for 3 days.      Total time spent taking care of this patient: TRIAGE  Signed: Letitia Libra 06/08/2017, 7:20 AM

## 2017-06-10 ENCOUNTER — Encounter: Payer: Medicaid Other | Admitting: Obstetrics & Gynecology

## 2017-06-15 ENCOUNTER — Encounter: Payer: Medicaid Other | Admitting: Obstetrics & Gynecology

## 2017-06-16 ENCOUNTER — Ambulatory Visit (INDEPENDENT_AMBULATORY_CARE_PROVIDER_SITE_OTHER): Payer: Medicaid Other | Admitting: Obstetrics and Gynecology

## 2017-06-16 VITALS — BP 100/60 | Wt 168.0 lb

## 2017-06-16 DIAGNOSIS — O26613 Liver and biliary tract disorders in pregnancy, third trimester: Secondary | ICD-10-CM

## 2017-06-16 DIAGNOSIS — O0991 Supervision of high risk pregnancy, unspecified, first trimester: Secondary | ICD-10-CM

## 2017-06-16 DIAGNOSIS — Z3A37 37 weeks gestation of pregnancy: Secondary | ICD-10-CM

## 2017-06-16 DIAGNOSIS — K8020 Calculus of gallbladder without cholecystitis without obstruction: Secondary | ICD-10-CM

## 2017-06-16 NOTE — Progress Notes (Signed)
Routine Prenatal Care Visit  Subjective  Jessica Randall is a 22 y.o. G3P2002 at [redacted]w[redacted]d being seen today for ongoing prenatal care.  She is currently monitored for the following issues for this high-risk pregnancy and has TIA (transient ischemic attack); Stroke-like symptoms; Hypertension; Hypokalemia; Anemia; Postpartum complication; Supervision of high risk pregnancy, antepartum, first trimester; Pain in symphysis pubis during pregnancy; Indication for care in labor and delivery, antepartum; Cholelithiasis affecting pregnancy in third trimester, antepartum; and RUQ pain on their problem list.  ----------------------------------------------------------------------------------- Patient reports no complaints.   Contractions: Irregular. Vag. Bleeding: None.  Movement: Present. Denies leaking of fluid.  ----------------------------------------------------------------------------------- The following portions of the patient's history were reviewed and updated as appropriate: allergies, current medications, past family history, past medical history, past social history, past surgical history and problem list. Problem list updated.   Objective  Blood pressure 100/60, weight 168 lb (76.2 kg), last menstrual period 09/28/2016, currently breastfeeding. Pregravid weight 184 lb (83.5 kg) Total Weight Gain -16 lb (-7.258 kg) Urinalysis:      Fetal Status:     Movement: Present     General:  Alert, oriented and cooperative. Patient is in no acute distress.  Skin: Skin is warm and dry. No rash noted.   Cardiovascular: Normal heart rate noted  Respiratory: Normal respiratory effort, no problems with respiration noted  Abdomen: Soft, gravid, appropriate for gestational age. Pain/Pressure: Present     Pelvic:  Cervical exam deferred        Extremities: Normal range of motion.     ental Status: Normal mood and affect. Normal behavior. Normal judgment and thought content.     Assessment   22 y.o.  [id number] at [redacted]w[redacted]d by  07/07/2017, by Ultrasound presenting for routine prenatal visit  Plan   pregnancy #3 Problems (from 11/12/16 to present)    Problem Noted Resolved   Cholelithiasis affecting pregnancy in third trimester, antepartum 05/12/2017 by [name], CNM No   Supervision of high risk pregnancy, antepartum, first trimester 11/12/2016 by Oswaldo Conroy, CNM No   Overview Addendum 05/12/2017 10:59 AM by [name], CNM    [facility]inic [facility] Prenatal Labs  Dating 7wk1d ultrasound Blood type: A/Positive/-- (10/10 1526)   Genetic Screen 1 Screen:    AFP:     Quad:     NIPS: Antibody:Negative (10/10 1526)  Anatomic [place]  Rubella: 2.06 (10/10 1526) Varicella:    GTT Early:               Third trimester:  RPR: Non Reactive (10/10 1526)   Rhogam  HBsAg: Negative (10/10 1526)   TDaP vaccine                       Flu Shot: HIV: Non Reactive (10/10 1526)   Baby Food                                GBS:   Contraception  Pap:  CBB     CS/VBAC    Support Person                  Gestational age appropriate obstetric precautions including but not limited to vaginal bleeding, contractions, leaking of fluid and fetal movement were reviewed in detail with the patient.    Patient had GBS , gonorrhea and chlamydia performed at [facility] yesterday. She is planning on delivering with [facility]. Discussed with patient that if  she is planning on delivering with [facility] she should obtain her prenatal care there as well.   Return in about 1 week (around 06/23/2017) for ROB.  Adelene Idler MD [facility], [facility] 06/16/17 3:23 PM

## 2017-06-18 ENCOUNTER — Inpatient Hospital Stay
Admission: EM | Admit: 2017-06-18 | Discharge: 2017-06-21 | DRG: 787 | Disposition: A | Discharge status: Home / Self Care | Payer: Medicaid Other | Attending: Obstetrics and Gynecology | Admitting: Obstetrics and Gynecology

## 2017-06-18 ENCOUNTER — Encounter
Admission: EM | Disposition: A | Discharge status: Home / Self Care | Payer: Self-pay | Source: Home / Self Care | Attending: Obstetrics and Gynecology

## 2017-06-18 ENCOUNTER — Inpatient Hospital Stay: Payer: Medicaid Other | Admitting: Certified Registered"

## 2017-06-18 DIAGNOSIS — D62 Acute posthemorrhagic anemia: Secondary | ICD-10-CM | POA: Diagnosis not present

## 2017-06-18 DIAGNOSIS — R109 Unspecified abdominal pain: Secondary | ICD-10-CM | POA: Diagnosis present

## 2017-06-18 DIAGNOSIS — O1414 Severe pre-eclampsia complicating childbirth: Principal | ICD-10-CM | POA: Diagnosis present

## 2017-06-18 DIAGNOSIS — Z98891 History of uterine scar from previous surgery: Secondary | ICD-10-CM

## 2017-06-18 DIAGNOSIS — Z3A37 37 weeks gestation of pregnancy: Secondary | ICD-10-CM

## 2017-06-18 DIAGNOSIS — Z23 Encounter for immunization: Secondary | ICD-10-CM | POA: Diagnosis not present

## 2017-06-18 DIAGNOSIS — O9081 Anemia of the puerperium: Secondary | ICD-10-CM | POA: Diagnosis not present

## 2017-06-18 DIAGNOSIS — O368390 Maternal care for abnormalities of the fetal heart rate or rhythm, unspecified trimester, not applicable or unspecified: Secondary | ICD-10-CM

## 2017-06-18 DIAGNOSIS — O0991 Supervision of high risk pregnancy, unspecified, first trimester: Secondary | ICD-10-CM

## 2017-06-18 DIAGNOSIS — O34219 Maternal care for unspecified type scar from previous cesarean delivery: Secondary | ICD-10-CM

## 2017-06-18 LAB — CBC
HEMATOCRIT: 30.8 % — AB (ref 35.0–47.0)
HEMOGLOBIN: 10.5 g/dL — AB (ref 12.0–16.0)
MCH: 25.2 pg — ABNORMAL LOW (ref 26.0–34.0)
MCHC: 34.2 g/dL (ref 32.0–36.0)
MCV: 73.7 fL — AB (ref 80.0–100.0)
Platelets: 224 10*3/uL (ref 150–440)
RBC: 4.17 MIL/uL (ref 3.80–5.20)
RDW: 15.7 % — ABNORMAL HIGH (ref 11.5–14.5)
WBC: 6.4 10*3/uL (ref 3.6–11.0)

## 2017-06-18 LAB — URINE DRUG SCREEN, QUALITATIVE (ARMC ONLY)
AMPHETAMINES, UR SCREEN: NOT DETECTED
Barbiturates, Ur Screen: NOT DETECTED
Benzodiazepine, Ur Scrn: NOT DETECTED
COCAINE METABOLITE, UR ~~LOC~~: NOT DETECTED
Cannabinoid 50 Ng, Ur ~~LOC~~: NOT DETECTED
MDMA (ECSTASY) UR SCREEN: NOT DETECTED
Methadone Scn, Ur: NOT DETECTED
OPIATE, UR SCREEN: NOT DETECTED
PHENCYCLIDINE (PCP) UR S: NOT DETECTED
Tricyclic, Ur Screen: NOT DETECTED

## 2017-06-18 LAB — TYPE AND SCREEN
ABO/RH(D): A POS
Antibody Screen: NEGATIVE

## 2017-06-18 SURGERY — Surgical Case
Anesthesia: General

## 2017-06-18 MED ORDER — SUCCINYLCHOLINE CHLORIDE 20 MG/ML IJ SOLN
INTRAMUSCULAR | Status: AC
  Filled 2017-06-18: qty 1

## 2017-06-18 MED ORDER — SOD CITRATE-CITRIC ACID 500-334 MG/5ML PO SOLN
30.0000 mL | ORAL | Status: AC
  Administered 2017-06-18: 30 mL via ORAL

## 2017-06-18 MED ORDER — BUPIVACAINE HCL (PF) 0.5 % IJ SOLN
INTRAMUSCULAR | Status: DC | PRN
  Administered 2017-06-18: 10 mL

## 2017-06-18 MED ORDER — BUPIVACAINE HCL (PF) 0.5 % IJ SOLN
INTRAMUSCULAR | Status: AC
  Filled 2017-06-18: qty 30

## 2017-06-18 MED ORDER — PHENYLEPHRINE HCL 10 MG/ML IJ SOLN
INTRAMUSCULAR | Status: DC | PRN
  Administered 2017-06-18 (×2): 100 ug via INTRAVENOUS
  Administered 2017-06-18 (×2): 200 ug via INTRAVENOUS

## 2017-06-18 MED ORDER — WITCH HAZEL-GLYCERIN EX PADS: 1.0000 "application " | MEDICATED_PAD | CUTANEOUS | Status: DC | PRN

## 2017-06-18 MED ORDER — MENTHOL 3 MG MT LOZG
1.0000 | LOZENGE | OROMUCOSAL | Status: DC | PRN
  Filled 2017-06-18: qty 9

## 2017-06-18 MED ORDER — OXYTOCIN 40 UNITS IN LACTATED RINGERS INFUSION - SIMPLE MED
2.5000 [IU]/h | INTRAVENOUS | Status: AC
  Administered 2017-06-18: 2.5 [IU]/h via INTRAVENOUS
  Filled 2017-06-18 (×3): qty 1000

## 2017-06-18 MED ORDER — OXYTOCIN 10 UNIT/ML IJ SOLN
INTRAMUSCULAR | Status: AC
  Filled 2017-06-18: qty 4

## 2017-06-18 MED ORDER — DEXAMETHASONE SODIUM PHOSPHATE 10 MG/ML IJ SOLN
INTRAMUSCULAR | Status: AC
  Filled 2017-06-18: qty 1

## 2017-06-18 MED ORDER — DIPHENHYDRAMINE HCL 12.5 MG/5ML PO ELIX
12.5000 mg | ORAL_SOLUTION | Freq: Four times a day (QID) | ORAL | Status: DC | PRN
  Filled 2017-06-18: qty 5

## 2017-06-18 MED ORDER — FENTANYL CITRATE (PF) 100 MCG/2ML IJ SOLN
INTRAMUSCULAR | Status: AC
  Administered 2017-06-18: 25 ug via INTRAVENOUS
  Filled 2017-06-18: qty 2

## 2017-06-18 MED ORDER — OXYCODONE HCL 5 MG/5ML PO SOLN: 5.0000 mg | Freq: Once | ORAL | Status: DC | PRN

## 2017-06-18 MED ORDER — ONDANSETRON HCL 4 MG/2ML IJ SOLN: 4.0000 mg | Freq: Four times a day (QID) | INTRAMUSCULAR | Status: DC | PRN

## 2017-06-18 MED ORDER — HYDROMORPHONE HCL 1 MG/ML IJ SOLN
INTRAMUSCULAR | Status: DC | PRN
  Administered 2017-06-18: 1 mg via INTRAVENOUS

## 2017-06-18 MED ORDER — SOD CITRATE-CITRIC ACID 500-334 MG/5ML PO SOLN
ORAL | Status: AC
  Filled 2017-06-18: qty 15

## 2017-06-18 MED ORDER — HYDROMORPHONE HCL 1 MG/ML IJ SOLN: 0.2500 mg | INTRAMUSCULAR | Status: DC | PRN

## 2017-06-18 MED ORDER — BUPIVACAINE 0.25 % ON-Q PUMP DUAL CATH 400 ML
400.0000 mL | INJECTION | Status: DC
  Filled 2017-06-18: qty 400

## 2017-06-18 MED ORDER — EPHEDRINE SULFATE 50 MG/ML IJ SOLN
INTRAMUSCULAR | Status: DC | PRN
  Administered 2017-06-18 (×2): 10 mg via INTRAVENOUS

## 2017-06-18 MED ORDER — LACTATED RINGERS IV SOLN
INTRAVENOUS | Status: DC
  Administered 2017-06-19: 19:00:00 via INTRAVENOUS

## 2017-06-18 MED ORDER — MIDAZOLAM HCL 2 MG/2ML IJ SOLN
INTRAMUSCULAR | Status: AC
  Filled 2017-06-18: qty 2

## 2017-06-18 MED ORDER — MEPERIDINE HCL 25 MG/ML IJ SOLN: 6.2500 mg | INTRAMUSCULAR | Status: DC | PRN

## 2017-06-18 MED ORDER — OXYTOCIN 40 UNITS IN LACTATED RINGERS INFUSION - SIMPLE MED
INTRAVENOUS | Status: AC
  Filled 2017-06-18: qty 1000

## 2017-06-18 MED ORDER — BISACODYL 10 MG RE SUPP: 10.0000 mg | Freq: Every day | RECTAL | Status: DC | PRN

## 2017-06-18 MED ORDER — CEFAZOLIN SODIUM-DEXTROSE 2-4 GM/100ML-% IV SOLN
2.0000 g | INTRAVENOUS | Status: AC
  Administered 2017-06-18: 2 g via INTRAVENOUS
  Filled 2017-06-18: qty 100

## 2017-06-18 MED ORDER — OXYTOCIN 40 UNITS IN LACTATED RINGERS INFUSION - SIMPLE MED
INTRAVENOUS | Status: DC | PRN
  Administered 2017-06-18: 750 mL via INTRAVENOUS

## 2017-06-18 MED ORDER — SIMETHICONE 80 MG PO CHEW
80.0000 mg | CHEWABLE_TABLET | Freq: Three times a day (TID) | ORAL | Status: DC
  Administered 2017-06-19 – 2017-06-21 (×8): 80 mg via ORAL
  Filled 2017-06-18 (×9): qty 1

## 2017-06-18 MED ORDER — MIDAZOLAM HCL 2 MG/2ML IJ SOLN
INTRAMUSCULAR | Status: DC | PRN
  Administered 2017-06-18: 2 mg via INTRAVENOUS

## 2017-06-18 MED ORDER — SUCCINYLCHOLINE CHLORIDE 20 MG/ML IJ SOLN
INTRAMUSCULAR | Status: DC | PRN
  Administered 2017-06-18: 100 mg via INTRAVENOUS

## 2017-06-18 MED ORDER — METHYLERGONOVINE MALEATE 0.2 MG/ML IJ SOLN
INTRAMUSCULAR | Status: DC | PRN
  Administered 2017-06-18: 0.2 mg via INTRAMUSCULAR

## 2017-06-18 MED ORDER — SIMETHICONE 80 MG PO CHEW: 80.0000 mg | CHEWABLE_TABLET | ORAL | Status: DC

## 2017-06-18 MED ORDER — HYDROMORPHONE 1 MG/ML IV SOLN
INTRAVENOUS | Status: DC
  Administered 2017-06-18: 25 mg via INTRAVENOUS
  Administered 2017-06-19: 1.2 mg via INTRAVENOUS
  Administered 2017-06-19: 0.6 mg via INTRAVENOUS
  Administered 2017-06-19: 2.2 mg via INTRAVENOUS
  Administered 2017-06-19: 1.1 mg via INTRAVENOUS
  Administered 2017-06-19: 0 mg via INTRAVENOUS
  Filled 2017-06-18: qty 25

## 2017-06-18 MED ORDER — DEXAMETHASONE SODIUM PHOSPHATE 10 MG/ML IJ SOLN
INTRAMUSCULAR | Status: DC | PRN
  Administered 2017-06-18: 10 mg via INTRAVENOUS

## 2017-06-18 MED ORDER — SENNOSIDES-DOCUSATE SODIUM 8.6-50 MG PO TABS
2.0000 | ORAL_TABLET | ORAL | Status: DC
  Administered 2017-06-20 – 2017-06-21 (×2): 2 via ORAL
  Filled 2017-06-18 (×2): qty 2

## 2017-06-18 MED ORDER — ZOLPIDEM TARTRATE 5 MG PO TABS: 5.0000 mg | ORAL_TABLET | Freq: Every evening | ORAL | Status: DC | PRN

## 2017-06-18 MED ORDER — PROPOFOL 10 MG/ML IV BOLUS
INTRAVENOUS | Status: DC | PRN
  Administered 2017-06-18: 170 mg via INTRAVENOUS
  Administered 2017-06-18: 30 mg via INTRAVENOUS

## 2017-06-18 MED ORDER — PROPOFOL 10 MG/ML IV BOLUS
INTRAVENOUS | Status: AC
  Filled 2017-06-18: qty 20

## 2017-06-18 MED ORDER — SIMETHICONE 80 MG PO CHEW: 80.0000 mg | CHEWABLE_TABLET | ORAL | Status: DC | PRN

## 2017-06-18 MED ORDER — METHYLERGONOVINE MALEATE 0.2 MG/ML IJ SOLN
INTRAMUSCULAR | Status: AC
  Filled 2017-06-18: qty 1

## 2017-06-18 MED ORDER — SODIUM CHLORIDE 0.9% FLUSH: 9.0000 mL | INTRAVENOUS | Status: DC | PRN

## 2017-06-18 MED ORDER — EPHEDRINE SULFATE 50 MG/ML IJ SOLN
INTRAMUSCULAR | Status: AC
  Filled 2017-06-18: qty 1

## 2017-06-18 MED ORDER — FENTANYL CITRATE (PF) 100 MCG/2ML IJ SOLN
INTRAMUSCULAR | Status: AC
  Filled 2017-06-18: qty 2

## 2017-06-18 MED ORDER — FLEET ENEMA 7-19 GM/118ML RE ENEM: 1.0000 | ENEMA | Freq: Every day | RECTAL | Status: DC | PRN

## 2017-06-18 MED ORDER — DIPHENHYDRAMINE HCL 50 MG/ML IJ SOLN: 12.5000 mg | Freq: Four times a day (QID) | INTRAMUSCULAR | Status: DC | PRN

## 2017-06-18 MED ORDER — COCONUT OIL OIL: 1.0000 "application " | TOPICAL_OIL | Status: DC | PRN

## 2017-06-18 MED ORDER — PRENATAL MULTIVITAMIN CH
1.0000 | ORAL_TABLET | Freq: Every day | ORAL | Status: DC
  Administered 2017-06-19 – 2017-06-21 (×3): 1 via ORAL
  Filled 2017-06-18 (×3): qty 1

## 2017-06-18 MED ORDER — PROMETHAZINE HCL 25 MG/ML IJ SOLN: 6.2500 mg | INTRAMUSCULAR | Status: DC | PRN

## 2017-06-18 MED ORDER — LACTATED RINGERS IV SOLN
INTRAVENOUS | Status: DC | PRN
  Administered 2017-06-18: 19:00:00 via INTRAVENOUS

## 2017-06-18 MED ORDER — HYDROMORPHONE HCL 1 MG/ML IJ SOLN
INTRAMUSCULAR | Status: AC
  Filled 2017-06-18: qty 1

## 2017-06-18 MED ORDER — ONDANSETRON HCL 4 MG/2ML IJ SOLN
INTRAMUSCULAR | Status: DC | PRN
  Administered 2017-06-18: 4 mg via INTRAVENOUS

## 2017-06-18 MED ORDER — DIBUCAINE 1 % RE OINT: 1.0000 "application " | TOPICAL_OINTMENT | RECTAL | Status: DC | PRN

## 2017-06-18 MED ORDER — BUPIVACAINE ON-Q PAIN PUMP (FOR ORDER SET NO CHG)
INJECTION | Status: DC
  Filled 2017-06-18 (×2): qty 1

## 2017-06-18 MED ORDER — FENTANYL CITRATE (PF) 100 MCG/2ML IJ SOLN
25.0000 ug | INTRAMUSCULAR | Status: AC | PRN
  Administered 2017-06-18 (×6): 25 ug via INTRAVENOUS

## 2017-06-18 MED ORDER — NALOXONE HCL 0.4 MG/ML IJ SOLN: 0.4000 mg | INTRAMUSCULAR | Status: DC | PRN

## 2017-06-18 MED ORDER — ONDANSETRON HCL 4 MG/2ML IJ SOLN
INTRAMUSCULAR | Status: AC
  Filled 2017-06-18: qty 2

## 2017-06-18 MED ORDER — OXYCODONE HCL 5 MG PO TABS: 5.0000 mg | ORAL_TABLET | Freq: Once | ORAL | Status: DC | PRN

## 2017-06-18 MED ORDER — FENTANYL CITRATE (PF) 100 MCG/2ML IJ SOLN
INTRAMUSCULAR | Status: DC | PRN
  Administered 2017-06-18: 100 ug via INTRAVENOUS
  Administered 2017-06-18 (×2): 50 ug via INTRAVENOUS

## 2017-06-18 SURGICAL SUPPLY — 23 items
CANISTER SUCT 3000ML PPV (MISCELLANEOUS) ×3 IMPLANT
CHLORAPREP W/TINT 26ML (MISCELLANEOUS) ×6 IMPLANT
DERMABOND ADVANCED (GAUZE/BANDAGES/DRESSINGS) ×2
DERMABOND ADVANCED .7 DNX12 (GAUZE/BANDAGES/DRESSINGS) ×1 IMPLANT
DRSG OPSITE POSTOP 4X10 (GAUZE/BANDAGES/DRESSINGS) ×3 IMPLANT
ELECT CAUTERY BLADE 6.4 (BLADE) IMPLANT
ELECT REM PT RETURN 9FT ADLT (ELECTROSURGICAL) ×3
ELECTRODE REM PT RTRN 9FT ADLT (ELECTROSURGICAL) ×1 IMPLANT
GLOVE BIOGEL PI IND STRL 6.5 (GLOVE) ×4 IMPLANT
GLOVE BIOGEL PI INDICATOR 6.5 (GLOVE) ×8
GOWN STRL REUS W/ TWL LRG LVL3 (GOWN DISPOSABLE) ×3 IMPLANT
GOWN STRL REUS W/ TWL XL LVL3 (GOWN DISPOSABLE) IMPLANT
GOWN STRL REUS W/TWL LRG LVL3 (GOWN DISPOSABLE) ×6
GOWN STRL REUS W/TWL XL LVL3 (GOWN DISPOSABLE)
NS IRRIG 1000ML POUR BTL (IV SOLUTION) ×3 IMPLANT
PACK C SECTION AR (MISCELLANEOUS) ×3 IMPLANT
PAD OB MATERNITY 4.3X12.25 (PERSONAL CARE ITEMS) ×3 IMPLANT
PAD PREP 24X41 OB/GYN DISP (PERSONAL CARE ITEMS) ×3 IMPLANT
SUT MNCRL AB 4-0 PS2 18 (SUTURE) ×3 IMPLANT
SUT PLAIN 3-0 (SUTURE) ×3 IMPLANT
SUT VIC AB 0 CT1 36 (SUTURE) ×9 IMPLANT
SUT VIC AB 2-0 CT1 36 (SUTURE) ×3 IMPLANT
SYR 30ML LL (SYRINGE) IMPLANT

## 2017-06-18 NOTE — Discharge Summary (Signed)
OB Discharge Summary     Patient Name: Jessica Randall DOB: 11-15-1995 MRN: [id number]  Date of admission: 06/18/2017 Delivering MD: Natale Milch, MD  Date of Delivery: 06/18/2017  Date of discharge: 06/21/2017  Admitting diagnosis: 37 Weeks Contractions Intrauterine pregnancy: [redacted]w[redacted]d     Secondary diagnosis: Anemia and Fetal tachycardia, non reassuring fetal heart tone.     Discharge diagnosis: Term Pregnancy Delivered, Preeclampsia (severe), status post cesarean delivery                         Hospital course:  Induction of Labor With Cesarean Section  22 y.o. yo G3P3003 at [redacted]w[redacted]d was admitted to the hospital 06/18/2017 for severe abdominal pain.  The patient went for cesarean section due to Non-Reassuring FHR, and delivered a Viable infant,06/18/2017  Membrane Rupture Time/Date:   ,    Details of operation can be found in separate operative Note.  Patient had an uncomplicated postpartum course. She is ambulating, tolerating a regular diet, passing flatus, and urinating well.  Patient is discharged home in stable condition on 06/21/17.                                                                                                  Post partum procedures:none  Complications: None  Physical exam on 06/21/2017: Vitals:   06/20/17 1546 06/20/17 1915 06/20/17 2305 06/21/17 0744  BP: 108/65  131/87 111/74  Pulse: 84  86 83  Resp: Temp: 97.9 F (36.6 C)  97.8 F (36.6 C) 98 F (36.7 C)  TempSrc: Oral  Axillary Oral  SpO2: 100%  100% 99%  Weight:  168 lb (76.2 kg)    Height:   (1.549 m)     General: alert, cooperative and no distress Lochia: appropriate Uterine Fundus: firm Incision: Healing well with no significant drainage, Dressing is clean, dry, and intact, no erythema, induration, warmth, and tenderness DVT Evaluation: No evidence of DVT seen on physical exam. No cords or calf tenderness. No significant calf/ankle edema.  Labs: Lab Results  Component  Value Date   WBC 13.3 (H) 06/19/2017   HGB 9.0 (L) 06/19/2017   HCT 27.5 (L) 06/19/2017   MCV 74.5 (L) 06/19/2017   PLT 213 06/19/2017   CMP Latest Ref Rng & Units 05/23/2017  Glucose 65 - 99 mg/dL 147(W)  BUN 6 - 20 mg/dL <2(N)  Creatinine 5.62 - 1.00 mg/dL 1.30  Sodium 865 - 784 mmol/L 135  Potassium 3.5 - 5.1 mmol/L 3.1(L)  Chloride 101 - 111 mmol/L 107  CO2 22 - 32 mmol/L 21(L)  Calcium 8.9 - 10.3 mg/dL 6.9(G)  Total Protein 6.5 - 8.1 g/dL 6.9  Total Bilirubin 0.3 - 1.2 mg/dL 0.4  Alkaline Phos 38 - 126 U/L 128(H)  AST 15 - 41 U/L 16  ALT 14 - 54 U/L 13(L)    Discharge instruction: per After Visit Summary.  Medications:  Allergies as of 06/21/2017   No Known Allergies     Medication List    STOP taking these medications  aspirin EC 81 MG tablet   clobetasol cream 0.05 % Commonly known as:  TEMOVATE   hydrocortisone 1 % Crea Commonly known as:  PROCTOCORT   ondansetron 4 MG disintegrating tablet Commonly known as:  ZOFRAN ODT   potassium chloride SA 20 MEQ tablet Commonly known as:  K-DUR,KLOR-CON     TAKE these medications   ibuprofen 600 MG tablet Commonly known as:  ADVIL,MOTRIN Take 1 tablet (600 mg total) by mouth every 6 (six) hours.   multivitamin-prenatal 27-0.8 MG Tabs tablet Take 1 tablet by mouth daily at 12 noon.   oxyCODONE-acetaminophen 5-325 MG tablet Commonly known as:  PERCOCET/ROXICET Take 2 tablets by mouth every 6 (six) hours as needed for severe pain (breakthrough pain).            Discharge Care Instructions  (From admission, onward)        Start     Ordered   06/21/17 0000  Discharge wound care:    Comments:  Perform wound care instructions   06/21/17 0941      Diet: routine diet  Activity: Advance as tolerated. Pelvic rest for 6 weeks.   Outpatient follow up: Follow-up Information    Schuman, Jaquelyn Bitter, MD. Schedule an appointment as soon as possible for a visit in 2 weeks.   Specialty:  Obstetrics and  Gynecology Contact information: [address] [place] [address] [phone]             Postpartum contraception: Undecided Rhogam Given postpartum: no Rubella vaccine given postpartum: no Varicella vaccine given postpartum: no TDaP given antepartum or postpartum: No - declined Influenza vaccine: declined  Newborn Data: Live born female  Birth Weight: 5 lb 13.5 oz (2650 g) APGAR: 8, 9  Newborn Delivery   Birth date/time:  06/18/2017 19:21:00 Delivery type:  C-Section, Low Transverse Trial of labor:  No C-section categorization:  Primary      Baby Feeding: formula and breast feeding  Disposition:home with mother  SIGNED: Thomasene Mohair, MD 06/21/2017 9:42 AM

## 2017-06-18 NOTE — Op Note (Signed)
Cesarean Section Procedure Note 06/18/17  Pre-operative Diagnosis: 1. 22 yo G3P3003 at 37 weeks 2 days gestation 2. Remote from delivery, non-reassuring fetal heart tones  3. Concern for possible abruption Post-operative Diagnosis: same, delivered. Procedure:  Low Transverse Cesarean Section Surgeon: Adelene Idler MD  Assistant(s): Genia Del CNM - No other skilled surgical assistant available. Anesthesia: General Estimated Blood Loss: 700 Complications: None; patient tolerated the procedure well.  Disposition: PACU - hemodynamically stable. Condition: stable   Findings: A female infant in the cephalic presentation. Amniotic fluid - clear   Birth weight: 5  lbs 13oz Apgars of 8 and 9.  Intact placenta with a three-vessel cord. Grossly normal uterus, tubes and ovaries bilaterally. No intraabdominal adhesions were noted.   Procedure Details    The patient was taken to operating room, identified as the correct patient and the procedure verified as C-Section Delivery. A time out was held and the above information confirmed. After induction of anesthesia, the patient was draped and prepped in the usual sterile manner. A Pfannenstiel incision was made and carried down through the subcutaneous tissue to the fascia. Fascial incision was made and extended transversely with the Mayo scissors. The fascia was separated from the underlying rectus tissue superiorly and inferiorly. The peritoneum was identified and entered bluntly. Peritoneal incision was extended longitudinally. A low transverse hysterotomy was made. The fetus was delivered atraumatically. The umbilical cord was clamped x2 and cut and the infant was handed to the awaiting pediatricians. The placenta was removed intact and appeared normal with a 3-vessel cord. The uterus was exteriorized and cleared of all clot and debris. The hysterotomy was closed with running sutures of 0 Vicryl suture. A second imbricating layer was placed  with the same suture. Excellent hemostasis was observed. The uterus was returned to the abdomen. The pelvis was irrigated and again, excellent hemostasis was noted. The peritoneum was closed with a running stitch of 2-0 Vicryl. The On Q Pain pump System was then placed.  Trocars were placed through the abdominal wall into the subfascial space and these were used to thread the silver soaker cathaters into place.The rectus muscles were inspected and were hemostatic. The rectus fascia was then reapproximated with running sutures of 0-vicryl, with careful placement not to incorporate the cathaters. Subcutaneous tissues are then irrigated with saline and hemostasis assured with the bovie. The subcutaneous fat was approximated with 3-0 plain and a running stitch.  Skin was then closed with 4-0 monocryl suture in a subcuticular fashion followed by skin adhesive. The cathaters are flushed each with 5 mL of Bupivicaine and stabilized into place with dressing. Instrument, sponge, and needle counts were correct prior to the abdominal closure and at the conclusion of the case.  The patient tolerated the procedure well and was transferred to the recovery room in stable condition.   Natale Milch MD [facility], [facility] 06/18/17 8:33 PM

## 2017-06-18 NOTE — Transfer of Care (Signed)
Immediate Anesthesia Transfer of Care Note  Patient: Jessica Randall  Procedure(s) Performed: CESAREAN SECTION (N/A )  Patient Location: PACU  Anesthesia Type:General  Level of Consciousness: awake and alert   Airway & Oxygen Therapy: Patient Spontanous Breathing and Patient connected to face mask oxygen  Post-op Assessment: Report given to RN and Post -op Vital signs reviewed and stable  Post vital signs: Reviewed  Last Vitals:  Vitals Value Taken Time  BP 148/79 06/18/2017  8:31 PM  Temp 36.8 C 06/18/2017  8:31 PM  Pulse 105 06/18/2017  8:31 PM  Resp 22 06/18/2017  8:31 PM  SpO2 100 % 06/18/2017  8:31 PM  Vitals shown include unvalidated device data.  Last Pain: There were no vitals filed for this visit.       Complications: No apparent anesthesia complications

## 2017-06-18 NOTE — H&P (Signed)
History and physical  Jessica Randall is an 22 y.o. female.  HPI: Patient presents to labor and delivery with extreme abdominal pain that has been present for 1 hour. She is having a difficult time speaking because of the pain. No vaginal bleedin. Denies leakage of fluid.   Past Medical History:  Diagnosis Date  . Anemia   . Anxiety and depression    history of SI  . History of drug use    MJ  . History of gonorrhea    with G2    Past Surgical History:  Procedure Laterality Date  . CHOLECYSTECTOMY Right 05/15/2017  . NO PAST SURGERIES      Family History  Problem Relation Age of Onset  . Lupus Mother   . Hypertension Mother   . Hyperthyroidism Mother   . Hyperlipidemia Mother   . Hypertension Maternal Grandmother   . Diabetes Maternal Grandmother   . Transient ischemic attack Maternal Grandmother   . Hyperlipidemia Maternal Grandfather   . Breast cancer Paternal Grandmother   . Transient ischemic attack Paternal Grandmother     Social History:  reports that she has never smoked. She has never used smokeless tobacco. She reports that she does not drink alcohol or use drugs.  Allergies: No Known Allergies  Medications: I have reviewed the patient's current medications.  No results found for this or any previous visit (from the past 48 hour(s)).  No results found.  Review of Systems  Constitutional: Negative for chills, fever, malaise/fatigue and weight loss.  HENT: Negative for congestion, hearing loss and sinus pain.   Eyes: Negative for blurred vision and double vision.  Respiratory: Negative for cough, sputum production, shortness of breath and wheezing.   Cardiovascular: Negative for chest pain, palpitations, orthopnea and leg swelling.  Gastrointestinal: Positive for abdominal pain. Negative for constipation, diarrhea, nausea and vomiting.  Genitourinary: Negative for dysuria, flank pain, frequency, hematuria and urgency.  Musculoskeletal: Negative for back  pain, falls and joint pain.  Skin: Negative for itching and rash.  Neurological: Negative for dizziness and headaches.  Psychiatric/Behavioral: Negative for depression, substance abuse and suicidal ideas. The patient is not nervous/anxious.    Blood pressure (!) 113/92, pulse 90, temperature 98.3 F (36.8 C), last menstrual period 09/28/2016, currently breastfeeding. Physical Exam  Nursing note and vitals reviewed. Constitutional: She is oriented to person, place, and time. She appears well-developed and well-nourished.  HENT:  Head: Normocephalic and atraumatic.  Cardiovascular: Normal rate and regular rhythm.  Respiratory: Effort normal and breath sounds normal.  GI: Soft. Bowel sounds are normal.  Genitourinary:  Genitourinary Comments: SVE 3/50/-3 unchanged from 3 days ago. Intact membranes.  Musculoskeletal: Normal range of motion.  Neurological: She is alert and oriented to person, place, and time.  Skin: Skin is warm and dry.  Psychiatric: She has a normal mood and affect. Her behavior is normal. Judgment and thought content normal.    pregnancy #3 Problems (from 11/12/16 to present)    Problem Noted Resolved   Cholelithiasis affecting pregnancy in third trimester, antepartum 05/12/2017 by [name], CNM No   Supervision of high risk pregnancy, antepartum, first trimester 11/12/2016 by Oswaldo Conroy, CNM No   Overview Addendum 05/12/2017 10:59 AM by [name], CNM    Clinic [facility] Prenatal Labs  Dating 7wk1d ultrasound Blood type: A/Positive/-- (10/10 1526)   Genetic Screen 1 Screen:    AFP:     Quad:     NIPS: Antibody:Negative (10/10 1526)  Anatomic [place]  Rubella:  2.06 (10/10 1526) Varicella:    GTT Early:               Third trimester:  RPR: Non Reactive (10/10 1526)   Rhogam  HBsAg: Negative (10/10 1526)   TDaP vaccine                       Flu Shot: HIV: Non Reactive (10/10 1526)   Baby Food                                GBS:   Contraception  Pap:   CBB     CS/VBAC    Support Person                 FHR: 180s baseline, moderate variability, repetitive variable decelerations to the 120s, no accelerations.   Assessment/Plan: 22 yo G3P0020 at 37 weeks 2 days gestation. 1. STAT cesarean section for non reassuring fetal heart tones. Remote from delivery. Concern for possible abruption.    Jessica Randall 06/18/2017, 6:53 PM

## 2017-06-18 NOTE — Discharge Instructions (Signed)

## 2017-06-18 NOTE — Anesthesia Preprocedure Evaluation (Signed)
Anesthesia Evaluation  Patient identified by MRN, date of birth, ID band Patient awake    Reviewed: Allergy & Precautions, NPO status , Patient's Chart, lab work & pertinent test results  History of Anesthesia Complications Negative for: history of anesthetic complications  Airway Mallampati: II  TM Distance: >3 FB Neck ROM: Full    Dental no notable dental hx.    Pulmonary neg pulmonary ROS, neg sleep apnea, neg COPD,    breath sounds clear to auscultation- rhonchi (-) wheezing      Cardiovascular Exercise Tolerance: Good (-) hypertension(-) CAD, (-) Past MI, (-) Cardiac Stents and (-) CABG  Rhythm:Regular Rate:Normal - Systolic murmurs and - Diastolic murmurs    Neuro/Psych Anxiety negative neurological ROS     GI/Hepatic negative GI ROS, Neg liver ROS,   Endo/Other  negative endocrine ROSneg diabetes  Renal/GU negative Renal ROS     Musculoskeletal negative musculoskeletal ROS (+)   Abdominal (+) + obese, Gravid abdomen  Peds  Hematology  (+) anemia ,   Anesthesia Other Findings Past Medical History: No date: Anemia No date: Anxiety and depression     Comment:  history of SI No date: History of drug use     Comment:  MJ No date: History of gonorrhea     Comment:  with G2   Reproductive/Obstetrics (+) Pregnancy                             Anesthesia Physical Anesthesia Plan  ASA: II and emergent  Anesthesia Plan: General   Post-op Pain Management:    Induction: Intravenous  PONV Risk Score and Plan: 2 and Ondansetron, Dexamethasone and Midazolam  Airway Management Planned: Oral ETT  Additional Equipment:   Intra-op Plan:   Post-operative Plan: Extubation in OR  Informed Consent: I have reviewed the patients History and Physical, chart, labs and discussed the procedure including the risks, benefits and alternatives for the proposed anesthesia with the patient or  authorized representative who has indicated his/her understanding and acceptance.   Dental advisory given  Plan Discussed with: CRNA and Anesthesiologist  Anesthesia Plan Comments:         Anesthesia Quick Evaluation

## 2017-06-18 NOTE — Anesthesia Post-op Follow-up Note (Signed)
Anesthesia QCDR form completed.        

## 2017-06-18 NOTE — Anesthesia Procedure Notes (Addendum)
Procedure Name: Intubation Performed by: Mathews Argyle, CRNA Pre-anesthesia Checklist: Patient identified, Patient being monitored, Timeout performed, Emergency Drugs available and Suction available Patient Re-evaluated:Patient Re-evaluated prior to induction Oxygen Delivery Method: Circle system utilized Preoxygenation: Pre-oxygenation with 100% oxygen Induction Type: IV induction and Rapid sequence Laryngoscope Size: 3 and McGraph Grade View: Grade I Tube type: Oral Tube size: 7.0 mm Number of attempts: 1 Airway Equipment and Method: Stylet and Video-laryngoscopy Placement Confirmation: ETT inserted through vocal cords under direct vision,  positive ETCO2 and breath sounds checked- equal and bilateral Secured at: 21 cm Tube secured with: Tape Dental Injury: Teeth and Oropharynx as per pre-operative assessment  Comments: OP clear of secretions

## 2017-06-19 ENCOUNTER — Encounter: Payer: Self-pay | Admitting: Obstetrics and Gynecology

## 2017-06-19 LAB — CBC
HCT: 27.5 % — ABNORMAL LOW (ref 35.0–47.0)
Hemoglobin: 9 g/dL — ABNORMAL LOW (ref 12.0–16.0)
MCH: 24.5 pg — ABNORMAL LOW (ref 26.0–34.0)
MCHC: 32.8 g/dL (ref 32.0–36.0)
MCV: 74.5 fL — ABNORMAL LOW (ref 80.0–100.0)
PLATELETS: 213 10*3/uL (ref 150–440)
RBC: 3.68 MIL/uL — ABNORMAL LOW (ref 3.80–5.20)
RDW: 15.6 % — AB (ref 11.5–14.5)
WBC: 13.3 10*3/uL — ABNORMAL HIGH (ref 3.6–11.0)

## 2017-06-19 MED ORDER — KETOROLAC TROMETHAMINE 30 MG/ML IJ SOLN
30.0000 mg | Freq: Once | INTRAMUSCULAR | Status: DC
  Filled 2017-06-19: qty 1

## 2017-06-19 MED ORDER — IBUPROFEN 600 MG PO TABS
600.0000 mg | ORAL_TABLET | Freq: Four times a day (QID) | ORAL | Status: DC
  Administered 2017-06-19 – 2017-06-21 (×8): 600 mg via ORAL
  Filled 2017-06-19 (×8): qty 1

## 2017-06-19 MED ORDER — OXYCODONE-ACETAMINOPHEN 5-325 MG PO TABS
1.0000 | ORAL_TABLET | ORAL | Status: DC | PRN
  Administered 2017-06-19 – 2017-06-20 (×4): 1 via ORAL
  Filled 2017-06-19 (×4): qty 1

## 2017-06-19 MED ORDER — KETOROLAC TROMETHAMINE 30 MG/ML IJ SOLN
30.0000 mg | Freq: Once | INTRAMUSCULAR | Status: AC
  Administered 2017-06-19: 30 mg via INTRAVENOUS

## 2017-06-19 MED ORDER — LACTATED RINGERS IV BOLUS
500.0000 mL | Freq: Once | INTRAVENOUS | Status: AC
  Administered 2017-06-19: 500 mL via INTRAVENOUS

## 2017-06-19 NOTE — Anesthesia Postprocedure Evaluation (Signed)
Anesthesia Post Note  Patient: Jessica Randall  Procedure(s) Performed: CESAREAN SECTION (N/A )  Patient location during evaluation: PACU Anesthesia Type: General Level of consciousness: awake and alert and oriented Pain management: pain level controlled Vital Signs Assessment: post-procedure vital signs reviewed and stable Respiratory status: spontaneous breathing, nonlabored ventilation and respiratory function stable Cardiovascular status: blood pressure returned to baseline and stable Postop Assessment: no signs of nausea or vomiting Anesthetic complications: no     Last Vitals:  Vitals:   06/19/17 0328 06/19/17 0335  BP: 126/80   Pulse: 91   Resp: 20 20  Temp: 36.4 C   SpO2: 99% 94%    Last Pain:  Vitals:   06/19/17 0335  TempSrc:   PainSc: 7                  Crimson Beer

## 2017-06-19 NOTE — Progress Notes (Signed)
Subjective:   Post Op Day 1 Patient is sleepy and has complaint of increased pain. She has On Q pump and PCA for pain. Order for Toradol and K Pad placed. Foley catheter is in place. She has not yet ambulated or voided or had a meal. Discussed normal post op progress with patient- PO intake, discontinue foley, switch to PO pain medicine, get out of bed to ambulate.   Objective:  Blood pressure 120/72, pulse 77, temperature 97.6 F (36.4 C), temperature source Oral, resp. rate 18, last menstrual period 09/28/2016, SpO2 100 %, currently breastfeeding and formula feeding.  General: NAD Pulmonary: no increased work of breathing Abdomen: non-distended, non-tender, fundus firm at level of umbilicus Incision: honeycomb dressing is C/D/I, On Q pump is intact Extremities: no edema, no erythema, no tenderness  Results for orders placed or performed during the hospital encounter of 06/18/17 (from the past 24 hour(s))  Type and screen [facility]     Status: None   Collection Time: 06/18/17  7:17 PM  Result Value Ref Range   ABO/RH(D) A POS    Antibody Screen NEG    Sample Expiration      06/21/2017 Performed at [facility], [address] [place], [place] [address]   CBC     Status: Abnormal   Collection Time: 06/18/17  7:17 PM  Result Value Ref Range   WBC 6.4 3.6 - 11.0 K/uL   RBC 4.17 3.80 - 5.20 MIL/uL   Hemoglobin 10.5 (L) 12.0 - 16.0 g/dL   HCT 60.4 (L) 54.0 - 98.1 %   MCV 73.7 (L) 80.0 - 100.0 fL   MCH 25.2 (L) 26.0 - 34.0 pg   MCHC 34.2 32.0 - 36.0 g/dL   RDW 19.1 (H) 47.8 - 29.5 %   Platelets 224 150 - 440 K/uL  Urine Drug Screen, Qualitative ([facility] only)     Status: None   Collection Time: 06/18/17  7:25 PM  Result Value Ref Range   Tricyclic, Ur Screen NONE DETECTED NONE DETECTED   Amphetamines, Ur Screen NONE DETECTED NONE DETECTED   MDMA (Ecstasy)Ur Screen NONE DETECTED NONE DETECTED   Cocaine Metabolite,Ur [place] NONE DETECTED NONE DETECTED    Opiate, Ur Screen NONE DETECTED NONE DETECTED   Phencyclidine (PCP) Ur S NONE DETECTED NONE DETECTED   Cannabinoid 50 Ng, Ur [place] NONE DETECTED NONE DETECTED   Barbiturates, Ur Screen NONE DETECTED NONE DETECTED   Benzodiazepine, Ur Scrn NONE DETECTED NONE DETECTED   Methadone Scn, Ur NONE DETECTED NONE DETECTED  CBC     Status: Abnormal   Collection Time: 06/19/17  4:57 AM  Result Value Ref Range   WBC 13.3 (H) 3.6 - 11.0 K/uL   RBC 3.68 (L) 3.80 - 5.20 MIL/uL   Hemoglobin 9.0 (L) 12.0 - 16.0 g/dL   HCT 62.1 (L) 30.8 - 65.7 %   MCV 74.5 (L) 80.0 - 100.0 fL   MCH 24.5 (L) 26.0 - 34.0 pg   MCHC 32.8 32.0 - 36.0 g/dL   RDW 84.6 (H) 96.2 - 95.2 %   Platelets 213 150 - 440 K/uL    Intake/Output Summary (Last 24 hours) at 06/19/2017 8413 Last data filed at 06/19/2017 0600 Gross per 24 hour  Intake 690 ml  Output 1250 ml  Net -560 ml     Assessment:   22 y.o. [id number] postoperativeday # 1   Plan:  1) Acute blood loss anemia - hemodynamically stable and asymptomatic - po ferrous sulfate  2) A positive, Rubella Immune, Varicella Immune  3) TDAP status: last documented dose is 2017  4) Breast/Bottle/Contraception: did not discuss  5) Disposition: Continue postpartum care   Tresea Mall, CNM

## 2017-06-20 ENCOUNTER — Other Ambulatory Visit: Payer: Self-pay

## 2017-06-20 MED ORDER — OXYCODONE-ACETAMINOPHEN 5-325 MG PO TABS
2.0000 | ORAL_TABLET | ORAL | Status: DC | PRN
  Administered 2017-06-20 – 2017-06-21 (×4): 2 via ORAL
  Filled 2017-06-20 (×4): qty 2

## 2017-06-20 MED ORDER — OXYCODONE-ACETAMINOPHEN 5-325 MG PO TABS: 1.0000 | ORAL_TABLET | ORAL | Status: DC | PRN

## 2017-06-20 NOTE — Progress Notes (Signed)
Patient ID: Jessica Randall, female   DOB: 26-May-1995, 22 y.o.   MRN: [id number]  Obstetric Postpartum/PostOperative Daily Progress Note Subjective:  22 y.o. [id number] POD#2 status post primary cesarean delivery.  She is ambulating, is tolerating po, is voiding spontaneously.  Her pain is not well controlled on PO pain medications. Her lochia is slightly greater than menses.   Medications SCHEDULED MEDICATIONS  . ibuprofen  600 mg Oral Q6H  . prenatal multivitamin  1 tablet Oral Q1200  . senna-docusate  2 tablet Oral Q24H  . simethicone  80 mg Oral TID PC  . simethicone  80 mg Oral Q24H    MEDICATION INFUSIONS  . bupivacaine 0.25 % ON-Q pump DUAL CATH 400 mL    . lactated ringers 125 mL/hr at 06/19/17 1838    PRN MEDICATIONS  bisacodyl, coconut oil, witch hazel-glycerin **AND** dibucaine, menthol-cetylpyridinium, oxyCODONE-acetaminophen **OR** oxyCODONE-acetaminophen, simethicone, sodium phosphate, zolpidem    Objective:   Vitals:   06/19/17 1615 06/19/17 1956 06/19/17 2352 06/20/17 0736  BP: 110/68 106/65 105/69 112/67  Pulse: 72 89 93 74  Resp: Temp: 97.8 F (36.6 C) 98.4 F (36.9 C) 98 F (36.7 C) 97.9 F (36.6 C)  TempSrc: Oral Oral Oral Oral  SpO2: 100% 100% 100% 99%    Current Vital Signs 24h Vital Sign Ranges  T 97.9 F (36.6 C) Temp  Avg: 98 F (36.7 C)  Min: 97.8 F (36.6 C)  Max: 98.4 F (36.9 C)  BP 112/67 BP  Min: 102/73  Max: 112/67  HR 74 Pulse  Avg: 82.6  Min: 72  Max: 93  RR 20 Resp  Avg: 18.4  Min: 16  Max: 20  SaO2 99 % Room Air SpO2  Avg: 99.7 %  Min: 99 %  Max: 100 %       24 Hour I/O Current Shift I/O  Time Ins Outs 05/17 0701 - 05/18 0700 In: 1287.2 [I.V.:1287.2] Out: 725 [Urine:725] No intake/output data recorded.  General: NAD Pulmonary: no increased work of breathing Abdomen: non-distended, non-tender, fundus firm at level of umbilicus Inc: Clean/dry/intact Extremities: no edema, no erythema, no tenderness  Labs:  Recent  Labs  Lab 06/18/17 1917 06/19/17 0457  WBC 6.4 13.3*  HGB 10.5* 9.0*  HCT 30.8* 27.5*  PLT 224 213     Assessment:   22 y.o. G3P3003 postoperative day # 2, s/p primary cesarean section, inadequate pain control  Plan:  1) Acute blood loss anemia - hemodynamically stable and asymptomatic - po ferrous sulfate  2) A POS / Rubella 2.06 (10/10 1526)/ Varicella Immune  3) TDAP status did not receive during pregnancy. Influenza vaccine: did not receive during pregnancy  4) breast and formula feeding (encouraged exclusive breast) /Contraception = patient unsure  5) pain control: will increase oral pain medication from percocet 5/235 mg 1 tablet q 4 hours prn, to up to 2 tablets for severe pain.  6) Disposition: home tomorrow  Conard Novak, MD, FACOG 06/20/2017 10:34 AM

## 2017-06-21 MED ORDER — IBUPROFEN 600 MG PO TABS: 600.0000 mg | ORAL_TABLET | Freq: Four times a day (QID) | ORAL | 0 refills | Status: DC

## 2017-06-21 MED ORDER — OXYCODONE-ACETAMINOPHEN 5-325 MG PO TABS: 2.0000 | ORAL_TABLET | Freq: Four times a day (QID) | ORAL | 0 refills | Status: DC | PRN

## 2017-06-21 NOTE — Progress Notes (Signed)
Discharge instructions given. Patient verbalizes understanding of teaching. Patient discharged home via wheelchair at 1300. 

## 2017-06-23 ENCOUNTER — Ambulatory Visit: Payer: Medicaid Other | Admitting: Advanced Practice Midwife

## 2017-06-24 ENCOUNTER — Ambulatory Visit: Payer: Medicaid Other | Admitting: Obstetrics and Gynecology

## 2017-06-26 ENCOUNTER — Telehealth: Payer: Self-pay

## 2017-06-26 NOTE — Telephone Encounter (Signed)
[facility] post partum nurse contacted [place] on 06/24/17 due to pt's Edinburgh score of 11. Pt has no thought of harming herself or baby but is having some anxiety and depression. She has no interest in medications but possibly talk therapy because she has done this in the past. She was given information for Kathreen Cosier at [facility]. BP was 123/79. Pt canceled appt on 06/23/17 and no showed appt on 06/24/17 at [facility] due to lack of transportation.   Requested front desk to contact pt to set up appt for post op and post partum visit. Awaiting appt. Per post partum nurse MD to be notified of [place]. Thank you.

## 2017-06-30 NOTE — Telephone Encounter (Signed)
Called and left voicemail for patient to call back to be schedule for follow up

## 2017-07-01 NOTE — Telephone Encounter (Signed)
Patient is schedule 07/03/17 with SDJ

## 2017-07-03 ENCOUNTER — Ambulatory Visit: Payer: Medicaid Other | Admitting: Obstetrics and Gynecology

## 2017-08-03 ENCOUNTER — Ambulatory Visit: Payer: Medicaid Other | Admitting: Obstetrics and Gynecology

## 2018-08-08 ENCOUNTER — Other Ambulatory Visit: Payer: Self-pay

## 2018-08-08 ENCOUNTER — Encounter: Payer: Self-pay | Admitting: Emergency Medicine

## 2018-08-08 ENCOUNTER — Emergency Department
Admission: EM | Admit: 2018-08-08 | Discharge: 2018-08-08 | Disposition: A | Discharge status: Home / Self Care | Payer: Medicaid Other | Attending: Emergency Medicine | Admitting: Emergency Medicine

## 2018-08-08 DIAGNOSIS — R509 Fever, unspecified: Secondary | ICD-10-CM | POA: Diagnosis present

## 2018-08-08 DIAGNOSIS — U071 COVID-19: Secondary | ICD-10-CM | POA: Diagnosis not present

## 2018-08-08 DIAGNOSIS — I10 Essential (primary) hypertension: Secondary | ICD-10-CM | POA: Insufficient documentation

## 2018-08-08 DIAGNOSIS — Z8673 Personal history of transient ischemic attack (TIA), and cerebral infarction without residual deficits: Secondary | ICD-10-CM | POA: Diagnosis not present

## 2018-08-08 MED ORDER — ACETAMINOPHEN 500 MG PO TABS
1000.0000 mg | ORAL_TABLET | Freq: Once | ORAL | Status: AC
  Administered 2018-08-08: 1000 mg via ORAL
  Filled 2018-08-08: qty 2

## 2018-08-08 NOTE — ED Provider Notes (Signed)
[facility] Emergency Department Provider Note  ____________________________________________   First MD Initiated Contact with Patient 08/08/18 1300     (approximate)  I have reviewed the triage vital signs and the nursing notes.   HISTORY  Chief Complaint Cough and Fever   HPI Jessica Randall is a 23 y.o. female presents to the ED with complaint of subjective fever for the last 2 days, frontal headache and some body aches.  Patient states that she and others did go to the beach and did not wear a mask.  She is not aware of known exposure to coronavirus.  She denies any other symptoms.  She rates her pain as a 10/10.      Past Medical History:  Diagnosis Date   Anemia    Anxiety and depression    history of SI   History of drug use    MJ   History of gonorrhea    with G2    Patient Active Problem List   Diagnosis Date Noted   [redacted] weeks gestation of pregnancy 06/18/2017   S/P cesarean section 06/18/2017   RUQ pain 05/22/2017   Hypokalemia 05/12/2017   Indication for care in labor and delivery, antepartum 05/12/2017   Cholelithiasis affecting pregnancy in third trimester, antepartum 05/12/2017   Pain in symphysis pubis during pregnancy 01/30/2017   Supervision of high risk pregnancy, antepartum, first trimester 11/12/2016   TIA (transient ischemic attack) 03/28/2015   Stroke-like symptoms 03/28/2015   Hypertension 03/28/2015   Anemia 03/28/2015   Postpartum complication     Past Surgical History:  Procedure Laterality Date   CESAREAN SECTION N/A 06/18/2017   Procedure: CESAREAN SECTION;  Surgeon: Homero Fellers, MD;  Location: [facility] ORS;  Service: Obstetrics;  Laterality: N/A;   CHOLECYSTECTOMY Right 05/15/2017   NO PAST SURGERIES      Prior to Admission medications   Medication Sig Start Date End Date Taking? Authorizing Provider  ibuprofen (ADVIL,MOTRIN) 600 MG tablet Take 1 tablet (600 mg total) by mouth every  6 (six) hours. 06/21/17   Will Bonnet, MD    Allergies Patient has no known allergies.  Family History  Problem Relation Age of Onset   Lupus Mother    Hypertension Mother    Hyperthyroidism Mother    Hyperlipidemia Mother    Hypertension Maternal Grandmother    Diabetes Maternal Grandmother    Transient ischemic attack Maternal Grandmother    Hyperlipidemia Maternal Grandfather    Breast cancer Paternal Grandmother    Transient ischemic attack Paternal Grandmother     Social History Social History   Tobacco Use   Smoking status: Never Smoker   Smokeless tobacco: Never Used  Substance Use Topics   Alcohol use: No    Alcohol/week: 0.0 standard drinks    Comment: occ.    Drug use: No    Comment: h/o THC use    Review of Systems Constitutional: Positive fever/chills Eyes: No visual changes. ENT: No sore throat. Cardiovascular: Denies chest pain. Respiratory: Denies shortness of breath. Gastrointestinal: No abdominal pain.  No nausea, no vomiting.  Genitourinary: Negative for dysuria. Musculoskeletal: Positive muscle aches. Skin: Negative for rash. Neurological: Positive for headaches, negative for focal weakness or numbness. ____________________________________________   PHYSICAL EXAM:  VITAL SIGNS: ED Triage Vitals  Enc Vitals Group     BP 08/08/18 1157 118/67     Pulse Rate 08/08/18 1157 86     Resp 08/08/18 1157 18     Temp 08/08/18 1157  99 F (37.2 C)     Temp Source 08/08/18 1157 Oral     SpO2 08/08/18 1157 99 %     Weight 08/08/18 1157 185 lb (83.9 kg)     Height 08/08/18 1157 5\' 1"  (1.549 m)     Head Circumference --      Peak Flow --      Pain Score 08/08/18 1206 10     Pain Loc --      Pain Edu? --      Excl. in GC? --     Constitutional: Alert and oriented. Well appearing and in no acute distress. Eyes: Conjunctivae are normal. PERRL. EOMI. Head: Atraumatic. Nose: No congestion/rhinnorhea. Neck: No stridor.     Cardiovascular: Normal rate, regular rhythm. Grossly normal heart sounds.  Good peripheral circulation. Respiratory: Normal respiratory effort.  No retractions. Lungs CTAB. Musculoskeletal: No lower extremity tenderness nor edema.  No joint effusions. Neurologic:  Normal speech and language. No gross focal neurologic deficits are appreciated. No gait instability. Skin:  Skin is warm, dry and intact. No rash noted. Psychiatric: Mood and affect are normal. Speech and behavior are normal.  ____________________________________________   LABS (all labs ordered are listed, but only abnormal results are displayed)  Labs Reviewed  NOVEL CORONAVIRUS, NAA (HOSPITAL ORDER, SEND-OUT TO REF LAB)   PROCEDURES  Procedure(s) performed (including Critical Care):  Procedures   ____________________________________________   INITIAL IMPRESSION / ASSESSMENT AND PLAN / ED COURSE  As part of my medical decision making, I reviewed the following data within the electronic MEDICAL RECORD NUMBER Notes from prior ED visits and [place] Controlled Substance Database  23 year old female presents to the ED with complaint of fever, chills, cough and headache for the last 2 days.  She is unaware of any known COVID exposure.  Exam is benign.  Patient was instructed to increase fluids and take Tylenol or ibuprofen as needed for body aches and fever.  A COVID test was sent out and patient was made aware that she would be called for the results.  She is also to follow-up with her PCP if any continued problems. ____________________________________________   FINAL CLINICAL IMPRESSION(S) / ED DIAGNOSES  Final diagnoses:  Fever and chills     ED Discharge Orders    None       Note:  This document was prepared using Dragon voice recognition software and may include unintentional dictation errors.    Tommi RumpsSummers, Amry Cathy L, PA-C 08/08/18 1504    Sharyn CreamerQuale, Mark, MD 08/08/18 1710

## 2018-08-08 NOTE — Discharge Instructions (Signed)
Follow-up with your primary care provider if any continued problems.  Self quarantine until you have heard from the results of your COVID test.  You may continue taking Tylenol or ibuprofen as needed for fever, chills, body aches or headache.  Increase fluids.

## 2018-08-08 NOTE — ED Notes (Signed)
See triage note  States she had a subjective fever for the past 2 days  And frontal headache    Low grade on arrival

## 2018-08-08 NOTE — ED Triage Notes (Signed)
Mom states she's had a cough and fever for 2 days now. Slight headache. NAD.

## 2018-08-10 LAB — NOVEL CORONAVIRUS, NAA (HOSP ORDER, SEND-OUT TO REF LAB; TAT 18-24 HRS): SARS-CoV-2, NAA: DETECTED — AB

## 2018-10-27 ENCOUNTER — Encounter: Payer: Medicaid Other | Admitting: Obstetrics and Gynecology

## 2018-11-09 ENCOUNTER — Other Ambulatory Visit: Payer: Self-pay

## 2018-11-09 ENCOUNTER — Encounter: Payer: Self-pay | Admitting: Maternal Newborn

## 2018-11-09 ENCOUNTER — Ambulatory Visit (INDEPENDENT_AMBULATORY_CARE_PROVIDER_SITE_OTHER): Payer: Medicaid Other | Admitting: Maternal Newborn

## 2018-11-09 ENCOUNTER — Other Ambulatory Visit (HOSPITAL_COMMUNITY)
Admission: RE | Admit: 2018-11-09 | Discharge: 2018-11-09 | Disposition: A | Discharge status: Home / Self Care | Payer: Medicaid Other | Source: Ambulatory Visit | Attending: Maternal Newborn | Admitting: Maternal Newborn

## 2018-11-09 VITALS — Wt 189.0 lb

## 2018-11-09 DIAGNOSIS — Z3A08 8 weeks gestation of pregnancy: Secondary | ICD-10-CM | POA: Diagnosis not present

## 2018-11-09 DIAGNOSIS — Z3481 Encounter for supervision of other normal pregnancy, first trimester: Secondary | ICD-10-CM | POA: Diagnosis not present

## 2018-11-09 DIAGNOSIS — Z3480 Encounter for supervision of other normal pregnancy, unspecified trimester: Secondary | ICD-10-CM

## 2018-11-09 DIAGNOSIS — Z124 Encounter for screening for malignant neoplasm of cervix: Secondary | ICD-10-CM | POA: Insufficient documentation

## 2018-11-09 DIAGNOSIS — Z113 Encounter for screening for infections with a predominantly sexual mode of transmission: Secondary | ICD-10-CM | POA: Diagnosis present

## 2018-11-09 DIAGNOSIS — Z369 Encounter for antenatal screening, unspecified: Secondary | ICD-10-CM

## 2018-11-09 LAB — OB RESULTS CONSOLE GC/CHLAMYDIA: Gonorrhea: NEGATIVE

## 2018-11-09 NOTE — Progress Notes (Signed)
11/09/2018   Chief Complaint: Desires prenatal care.  Transfer of Care Patient: No  History of Present Illness: [name] is a 23 y.o. G4P3003 at 7954w4d based on Patient's last menstrual period on 09/10/2018, with an Estimated Date of Delivery: 06/17/2019, with the above CC.   Her periods were: regular periods every month lasting 5-7 days. She has Positive signs or symptoms of nausea/vomiting of pregnancy. She has Negative signs or symptoms of miscarriage or preterm labor She identifies Negative Zika risk factors for her and her partner On any different medications around the time she conceived/early pregnancy: No  History of varicella: No   Review of Systems  Constitutional: Positive for malaise/fatigue.  HENT: Negative.   Eyes: Negative.   Respiratory: Negative for shortness of breath and wheezing.   Cardiovascular: Negative for chest pain and palpitations.  Gastrointestinal: Positive for nausea.  Genitourinary: Negative.   Musculoskeletal: Negative.   Skin: Positive for itching and rash.  Neurological: Negative.   Endo/Heme/Allergies: Positive for environmental allergies.       Hot flashes Sneezing  Psychiatric/Behavioral: Negative.    Review of systems was otherwise negative, except as stated in the above HPI.  OBGYN History: As per HPI. OB History  Gravida Para Term Preterm AB Living  4 3 3  0 0 3  SAB TAB Ectopic Multiple Live Births        0 3    # Outcome Date GA Lbr Len/2nd Weight Sex Delivery Anes PTL Lv  4 Current           3 Term 06/18/17 555w2d  5 lb 13.5 oz (2.65 kg) M CS-LTranv Gen  LIV  2 Term 03/21/15 7429w0d  6 lb 13 oz (3.09 kg) M Vag-Spont  N LIV  1 Term 01/24/12 4993w6d  7 lb (3.175 kg) M Vag-Spont  N LIV    Obstetric Comments  01/2012: 7lbs 3oz (Dr. Tiburcio PeaHarris, [facility]) median episotomy; type of tear not documented.     Any issues with any prior pregnancies: Cesarean delivery with G3 due to non-reassuring fetal heart tones and concern for placental  abruption. Postpartum hypertension/TIA with G2. Any prior children are healthy, doing well, without any problems or issues: yes History of pap smears: No.  History of STIs: Yes, gonorrhea and chlamydia   Past Medical History: Past Medical History:  Diagnosis Date  . Anemia   . Anxiety and depression    history of SI  . Cholelithiasis affecting pregnancy in third trimester, antepartum 05/12/2017  . History of drug use    MJ  . History of gonorrhea    with G2  . Pain in symphysis pubis during pregnancy 01/30/2017  . Stroke-like symptoms 03/28/2015  . TIA (transient ischemic attack) 03/28/2015    Past Surgical History: Past Surgical History:  Procedure Laterality Date  . CESAREAN SECTION N/A 06/18/2017   Procedure: CESAREAN SECTION;  Surgeon: Natale MilchSchuman, Christanna R, MD;  Location: [facility] ORS;  Service: Obstetrics;  Laterality: N/A;  . CHOLECYSTECTOMY Right 05/15/2017  . NO PAST SURGERIES      Family History:  Family History  Problem Relation Age of Onset  . Lupus Mother   . Hypertension Mother   . Hyperthyroidism Mother   . Hyperlipidemia Mother   . Hypertension Maternal Grandmother   . Diabetes Maternal Grandmother   . Transient ischemic attack Maternal Grandmother   . Hyperlipidemia Maternal Grandfather   . Breast cancer Paternal Grandmother   . Transient ischemic attack Paternal Grandmother     She does  not have a history of any female cancers, bleeding or blood clotting disorders.  There is no history of intellectual disability, birth defects or genetic disorders in her or the FOB's history.  Social History:  Social History   Socioeconomic History  . Marital status: Single    Spouse name: Not on file  . Number of children: 2  . Years of education: Not on file  . Highest education level: Not on file  Occupational History  . Not on file  Social Needs  . Financial resource strain: Not on file  . Food insecurity    Worry: Not on file    Inability: Not on file  .  Transportation needs    Medical: Not on file    Non-medical: Not on file  Tobacco Use  . Smoking status: Never Smoker  . Smokeless tobacco: Never Used  Substance and Sexual Activity  . Alcohol use: No    Alcohol/week: 0.0 standard drinks    Comment: occ.   . Drug use: No    Comment: h/o THC use  . Sexual activity: Yes    Birth control/protection: None    Comment: undecided  Lifestyle  . Physical activity    Days per week: Not on file    Minutes per session: Not on file  . Stress: Not on file  Relationships  . Social Musician on phone: Not on file    Gets together: Not on file    Attends religious service: Not on file    Active member of club or organization: Not on file    Attends meetings of clubs or organizations: Not on file    Relationship status: Not on file  . Intimate partner violence    Fear of current or ex partner: Not on file    Emotionally abused: Not on file    Physically abused: Not on file    Forced sexual activity: Not on file  Other Topics Concern  . Not on file  Social History Narrative  . Not on file   Any cats in the household: no Domestic violence screening is negative.  Allergy: No Known Allergies  Current Outpatient Medications:  Current Outpatient Medications:  .  ibuprofen (ADVIL,MOTRIN) 600 MG tablet, Take 1 tablet (600 mg total) by mouth every 6 (six) hours. (Patient not taking: Reported on 11/09/2018), Disp: 30 tablet, Rfl: 0   Physical Exam:   Wt 189 lb (85.7 kg)   LMP 09/10/2018   BMI 35.71 kg/m  Body mass index is 35.71 kg/m. Constitutional: Well nourished, well developed female in no acute distress.  Neck:  Supple, normal appearance, and no thyromegaly  Cardiovascular: S1, S2 normal, no murmur, rub or gallop, regular rate and rhythm Respiratory:  Clear to auscultation bilaterally. Normal respiratory effort Abdomen: No masses, hernias; diffusely non tender to palpation, non distended Breasts: patient declines to  have breast exam. Neuro/Psych:  Normal mood and affect.  Skin:  Warm and dry.  Lymphatic:  No inguinal lymphadenopathy.   Pelvic exam: is not limited by body habitus External genitalia, Bartholin's glands, Urethra, Skene's glands: within normal limits Vagina: within normal limits and with no blood in the vault  Cervix: normal appearing cervix without discharge or lesions, closed/long/high Uterus:  enlarged, c/w early pregnancy, normal contour Adnexa:  no mass, fullness, tenderness  Assessment: Jessica Randall is a 23 y.o. [id number] [redacted]w[redacted]d based on Patient's last menstrual period was 09/10/2018. with an Estimated Date of Delivery: 06/17/19, presenting for prenatal care.  Plan:  1) Avoid alcoholic beverages. 2) Patient encouraged not to smoke.  3) Discontinue the use of all non-medicinal drugs and chemicals.  4) Take prenatal vitamins daily.  5) Seatbelt use advised 6) Nutrition, food safety and exercise discussed. 7) Hospital and practice style delivering at [facility] discussed  8) Patient is asked about travel to areas at risk for the Panora virus, and counseled to avoid travel and exposure to mosquitoes or partners who may have themselves been exposed to the virus. Marland Kitchen  9) Genetic Screening, such as with 1st Trimester Screening, cell free fetal DNA, AFP testing, and Ultrasound, is discussed with patient. She is considering genetic testing this pregnancy. 10) NOB labs and early GTT ordered; Pap today. 11) Dating scan ordered.  Problem list reviewed and updated.  Avel Sensor, CNM [facility], [facility] 11/09/2018  2:40 PM

## 2018-11-10 ENCOUNTER — Encounter: Payer: Self-pay | Admitting: Maternal Newborn

## 2018-11-10 DIAGNOSIS — Z3480 Encounter for supervision of other normal pregnancy, unspecified trimester: Secondary | ICD-10-CM | POA: Insufficient documentation

## 2018-11-10 DIAGNOSIS — O0991 Supervision of high risk pregnancy, unspecified, first trimester: Secondary | ICD-10-CM | POA: Insufficient documentation

## 2018-11-10 LAB — URINE DRUG PANEL 7
Amphetamines, Urine: NEGATIVE ng/mL
Barbiturate Quant, Ur: NEGATIVE ng/mL
Benzodiazepine Quant, Ur: NEGATIVE ng/mL
Cannabinoid Quant, Ur: NEGATIVE ng/mL
Cocaine (Metab.): NEGATIVE ng/mL
Opiate Quant, Ur: NEGATIVE ng/mL
PCP Quant, Ur: NEGATIVE ng/mL

## 2018-11-11 LAB — URINE CULTURE

## 2018-11-16 LAB — CYTOLOGY - PAP
Chlamydia: NEGATIVE
Comment: NEGATIVE
Comment: NORMAL
Diagnosis: NEGATIVE
Neisseria Gonorrhea: NEGATIVE

## 2018-11-22 ENCOUNTER — Other Ambulatory Visit: Payer: Medicaid Other

## 2018-11-22 ENCOUNTER — Ambulatory Visit (INDEPENDENT_AMBULATORY_CARE_PROVIDER_SITE_OTHER): Payer: Medicaid Other

## 2018-11-22 ENCOUNTER — Encounter: Payer: Self-pay | Admitting: Obstetrics and Gynecology

## 2018-11-22 ENCOUNTER — Ambulatory Visit (INDEPENDENT_AMBULATORY_CARE_PROVIDER_SITE_OTHER): Payer: Medicaid Other | Admitting: Obstetrics and Gynecology

## 2018-11-22 ENCOUNTER — Other Ambulatory Visit: Payer: Self-pay

## 2018-11-22 VITALS — BP 118/74 | Wt 186.0 lb

## 2018-11-22 DIAGNOSIS — Z3481 Encounter for supervision of other normal pregnancy, first trimester: Secondary | ICD-10-CM

## 2018-11-22 DIAGNOSIS — Z3480 Encounter for supervision of other normal pregnancy, unspecified trimester: Secondary | ICD-10-CM

## 2018-11-22 DIAGNOSIS — N8312 Corpus luteum cyst of left ovary: Secondary | ICD-10-CM

## 2018-11-22 DIAGNOSIS — Z3A11 11 weeks gestation of pregnancy: Secondary | ICD-10-CM | POA: Diagnosis not present

## 2018-11-22 DIAGNOSIS — O3481 Maternal care for other abnormalities of pelvic organs, first trimester: Secondary | ICD-10-CM

## 2018-11-22 DIAGNOSIS — Z3A10 10 weeks gestation of pregnancy: Secondary | ICD-10-CM

## 2018-11-22 DIAGNOSIS — Z369 Encounter for antenatal screening, unspecified: Secondary | ICD-10-CM

## 2018-11-22 LAB — OB RESULTS CONSOLE VARICELLA ZOSTER ANTIBODY, IGG: Varicella: IMMUNE

## 2018-11-22 NOTE — Progress Notes (Signed)
Dating scan and labs today. No vb. No lof.

## 2018-11-22 NOTE — Progress Notes (Signed)
    Routine Prenatal Care Visit  Subjective  Jessica Randall is a 23 y.o. [phone] at [redacted]w[redacted]d being seen today for ongoing prenatal care.  She is currently monitored for the following issues for this low-risk pregnancy and has S/P cesarean section and Supervision of other normal pregnancy, antepartum on their problem list.  ----------------------------------------------------------------------------------- Patient reports no complaints.   Contractions: Not present. Vag. Bleeding: None.  Movement: Absent. Denies leaking of fluid.  ----------------------------------------------------------------------------------- The following portions of the patient's history were reviewed and updated as appropriate: allergies, current medications, past family history, past medical history, past social history, past surgical history and problem list. Problem list updated.   Objective  Blood pressure 118/74, weight 186 lb (84.4 kg), last menstrual period 09/10/2018, unknown if currently breastfeeding. Pregravid weight 189 lb (85.7 kg) Total Weight Gain -3 lb (-1.361 kg) Urinalysis:      Fetal Status: Fetal Heart Rate (bpm): 165   Movement: Absent     General:  Alert, oriented and cooperative. Patient is in no acute distress.  Skin: Skin is warm and dry. No rash noted.   Cardiovascular: Normal heart rate noted  Respiratory: Normal respiratory effort, no problems with respiration noted  Abdomen: Soft, gravid, appropriate for gestational age. Pain/Pressure: Absent     Pelvic:  Cervical exam deferred        Extremities: Normal range of motion.  Edema: None  Mental Status: Normal mood and affect. Normal behavior. Normal judgment and thought content.     Assessment   23 y.o. [id number] at [redacted]w[redacted]d by  06/17/2019, by Last Menstrual Period presenting for routine prenatal visit  Plan   pregnancy4 Problems (from 09/10/18 to present)    Problem Noted Resolved   Supervision of other normal pregnancy, antepartum  11/10/2018 by Rexene Agent, CNM No   Overview Addendum 11/22/2018 11:56 AM by Homero Fellers, MD    Clinic [facility] Prenatal [facility]  Dating  LMP=10 wk [place] Blood type:     Genetic Screen declines Antibody:   Anatomic [place]  Rubella:   Varicella:    GTT Early:               Third trimester:  RPR:     Rhogam  HBsAg:     TDaP vaccine                       Flu Shot: HIV:     Baby Food                                GBS:   Contraception  Pap:  CBB     CS/VBAC    Support Person                  Gestational age appropriate obstetric precautions including but not limited to vaginal bleeding, contractions, leaking of fluid and fetal movement were reviewed in detail with the patient.    Labs and [place] today Declines genetic screening, will follow up if she changes her mind.  Return in about 4 weeks (around 12/20/2018) for [place] on phone.  Homero Fellers MD [facility], [facility] 11/22/2018, 12:06 PM

## 2018-11-24 LAB — RPR+RH+ABO+RUB AB+AB SCR+CB...
Antibody Screen: NEGATIVE
HIV Screen 4th Generation wRfx: NONREACTIVE
Hematocrit: 36.2 % (ref 34.0–46.6)
Hemoglobin: 12.1 g/dL (ref 11.1–15.9)
Hepatitis B Surface Ag: NEGATIVE
MCH: 26.7 pg (ref 26.6–33.0)
MCHC: 33.4 g/dL (ref 31.5–35.7)
MCV: 80 fL (ref 79–97)
Platelets: 312 10*3/uL (ref 150–450)
RBC: 4.53 x10E6/uL (ref 3.77–5.28)
RDW: 13.4 % (ref 11.7–15.4)
RPR Ser Ql: NONREACTIVE
Rh Factor: POSITIVE
Rubella Antibodies, IGG: 2.66 index (ref >0.99)
Varicella zoster IgG: 175 index (ref >165)
WBC: 10.2 10*3/uL (ref 3.4–10.8)

## 2018-11-24 LAB — HEMOGLOBINOPATHY EVALUATION
HGB C: 0 %
HGB S: 0 %
HGB VARIANT: 0 %
Hemoglobin A2 Quantitation: 2.4 % (ref 1.8–3.2)
Hemoglobin F Quantitation: 0 % (ref 0.0–2.0)
Hgb A: 97.6 % (ref 96.4–98.8)

## 2018-11-24 LAB — GLUCOSE, 1 HOUR GESTATIONAL: Gestational Diabetes Screen: 96 mg/dL (ref 65–139)

## 2018-12-10 ENCOUNTER — Other Ambulatory Visit: Payer: Self-pay | Admitting: Obstetrics & Gynecology

## 2018-12-10 ENCOUNTER — Telehealth: Payer: Self-pay

## 2018-12-10 ENCOUNTER — Other Ambulatory Visit: Payer: Medicaid Other

## 2018-12-10 DIAGNOSIS — Z1379 Encounter for other screening for genetic and chromosomal anomalies: Secondary | ICD-10-CM

## 2018-12-10 NOTE — Telephone Encounter (Signed)
Pt would like to have maternit21 testing, can you pls put in order so she can schedule lab visit? Has a telephone visit with you on 11/16.

## 2018-12-10 NOTE — Telephone Encounter (Signed)
Order done, she have it done anytime

## 2018-12-10 NOTE — Telephone Encounter (Signed)
Pt aware and transferred to [facility] to schedule lab appt. She asked if medicaid will cover test, per [facility] yes!

## 2018-12-13 ENCOUNTER — Telehealth: Payer: Self-pay

## 2018-12-13 NOTE — Telephone Encounter (Signed)
Pt calling; needs proof of preg faxed to [phone].  [phone]  Pt aware proof of preg faxed and confirmation recv'd.

## 2018-12-15 ENCOUNTER — Telehealth: Payer: Self-pay | Admitting: Obstetrics and Gynecology

## 2018-12-15 NOTE — Telephone Encounter (Signed)
Patient is calling today with a complaint of headache. Patient requested to be seen today currently no opening for today. Advised we could see her tomorrow. Patient states she already has a appointment for tomorrow then asked if she needed to go to the hospital. I told patient she would need to be evaluated that we could see her tomorrow. Patient then said that she was going to hospital and hung up the phone before I could respond. Patient refused to schedule with a provider. She is schedule for lab appointment only for tomorrow.

## 2018-12-16 ENCOUNTER — Other Ambulatory Visit: Payer: Self-pay | Admitting: Obstetrics and Gynecology

## 2018-12-16 ENCOUNTER — Other Ambulatory Visit: Payer: Medicaid Other

## 2018-12-16 ENCOUNTER — Telehealth: Payer: Self-pay | Admitting: Obstetrics and Gynecology

## 2018-12-16 ENCOUNTER — Other Ambulatory Visit: Payer: Self-pay

## 2018-12-16 DIAGNOSIS — O26891 Other specified pregnancy related conditions, first trimester: Secondary | ICD-10-CM

## 2018-12-16 DIAGNOSIS — Z1379 Encounter for other screening for genetic and chromosomal anomalies: Secondary | ICD-10-CM

## 2018-12-16 MED ORDER — BUTALBITAL-APAP-CAFFEINE 50-325-40 MG PO CAPS: 1.0000 | ORAL_CAPSULE | Freq: Four times a day (QID) | ORAL | 0 refills | Status: DC | PRN

## 2018-12-16 NOTE — Telephone Encounter (Signed)
Sent a rx for Fioricet, if this doesn't works she would need to seek acute treatment in the ER.

## 2018-12-16 NOTE — Telephone Encounter (Signed)
Pt states she's had a head ache for 4 days tylenol is not helping, she's drinking plenty of water. She goes to sleep and wakes up and the h/a is still here. Can we send in something else to help the h/a, Please advise

## 2018-12-16 NOTE — Telephone Encounter (Signed)
OB 13 wks states she is having headaches, please advise.

## 2018-12-20 ENCOUNTER — Other Ambulatory Visit: Payer: Self-pay

## 2018-12-20 ENCOUNTER — Encounter: Payer: Medicaid Other | Admitting: Obstetrics & Gynecology

## 2018-12-23 ENCOUNTER — Telehealth: Payer: Self-pay

## 2018-12-23 NOTE — Telephone Encounter (Signed)
Pt calling to see if gender results are back.  [phone]  Adv they are not back yet.

## 2018-12-24 ENCOUNTER — Telehealth: Payer: Self-pay | Admitting: Obstetrics & Gynecology

## 2018-12-24 NOTE — Telephone Encounter (Signed)
Let her know not back yet

## 2018-12-24 NOTE — Telephone Encounter (Signed)
Patient is calling for [facility] 21 results. Please advise

## 2018-12-24 NOTE — Telephone Encounter (Signed)
Left message to advise pt results not back yet

## 2018-12-24 NOTE — Telephone Encounter (Signed)
Patient is requesting to be called at [phone]

## 2018-12-25 LAB — MATERNIT21 PLUS CORE+SCA
Fetal Fraction: 4
Monosomy X (Turner Syndrome): NOT DETECTED
Result (T21): NEGATIVE
Trisomy 13 (Patau syndrome): NEGATIVE
Trisomy 18 (Edwards syndrome): NEGATIVE
Trisomy 21 (Down syndrome): NEGATIVE
XXX (Triple X Syndrome): NOT DETECTED
XXY (Klinefelter Syndrome): NOT DETECTED
XYY (Jacobs Syndrome): NOT DETECTED

## 2018-12-27 NOTE — Progress Notes (Signed)
Normal , XY, LM to call

## 2019-01-07 ENCOUNTER — Encounter: Payer: Medicaid Other | Admitting: Obstetrics and Gynecology

## 2019-01-10 ENCOUNTER — Other Ambulatory Visit: Payer: Self-pay

## 2019-01-10 ENCOUNTER — Ambulatory Visit (INDEPENDENT_AMBULATORY_CARE_PROVIDER_SITE_OTHER): Payer: Medicaid Other | Admitting: Obstetrics and Gynecology

## 2019-01-10 ENCOUNTER — Other Ambulatory Visit: Payer: Self-pay | Admitting: Obstetrics and Gynecology

## 2019-01-10 VITALS — BP 128/66 | Wt 185.0 lb

## 2019-01-10 DIAGNOSIS — Z3480 Encounter for supervision of other normal pregnancy, unspecified trimester: Secondary | ICD-10-CM

## 2019-01-10 DIAGNOSIS — Z98891 History of uterine scar from previous surgery: Secondary | ICD-10-CM

## 2019-01-10 DIAGNOSIS — O34219 Maternal care for unspecified type scar from previous cesarean delivery: Secondary | ICD-10-CM

## 2019-01-10 DIAGNOSIS — Z363 Encounter for antenatal screening for malformations: Secondary | ICD-10-CM

## 2019-01-10 DIAGNOSIS — Z3A17 17 weeks gestation of pregnancy: Secondary | ICD-10-CM

## 2019-01-10 DIAGNOSIS — O26892 Other specified pregnancy related conditions, second trimester: Secondary | ICD-10-CM

## 2019-01-10 DIAGNOSIS — N898 Other specified noninflammatory disorders of vagina: Secondary | ICD-10-CM

## 2019-01-10 LAB — OB RESULTS CONSOLE GC/CHLAMYDIA
Chlamydia: NEGATIVE
Gonorrhea: NEGATIVE

## 2019-01-10 NOTE — Progress Notes (Signed)
ROB Staying wet

## 2019-01-10 NOTE — Progress Notes (Signed)
    Routine Prenatal Care Visit  Subjective  Jessica Randall is a 23 y.o. [phone] at [redacted]w[redacted]d being seen today for ongoing prenatal care.  She is currently monitored for the following issues for this high-risk pregnancy and has S/P cesarean section and Supervision of other normal pregnancy, antepartum on their problem list.  ----------------------------------------------------------------------------------- Patient reports no complaints.   Contractions: Not present. Vag. Bleeding: None.  Movement: Present. Denies leaking of fluid.  ----------------------------------------------------------------------------------- The following portions of the patient's history were reviewed and updated as appropriate: allergies, current medications, past family history, past medical history, past social history, past surgical history and problem list. Problem list updated.   Objective  Blood pressure 128/66, weight 185 lb (83.9 kg), last menstrual period 09/10/2018, unknown if currently breastfeeding. Pregravid weight 189 lb (85.7 kg) Total Weight Gain -4 lb (-1.814 kg) Urinalysis:      Fetal Status: Fetal Heart Rate (bpm): 155   Movement: Present     General:  Alert, oriented and cooperative. Patient is in no acute distress.  Skin: Skin is warm and dry. No rash noted.   Cardiovascular: Normal heart rate noted  Respiratory: Normal respiratory effort, no problems with respiration noted  Abdomen: Soft, gravid, appropriate for gestational age. Pain/Pressure: Absent     Pelvic:  Cervical exam deferred        Extremities: Normal range of motion.     ental Status: Normal mood and affect. Normal behavior. Normal judgment and thought content.     Assessment   23 y.o. [id number] at [redacted]w[redacted]d by  06/17/2019, by Last Menstrual Period presenting for routine prenatal visit  Plan    Gestational age appropriate obstetric precautions including but not limited to vaginal bleeding, contractions, leaking of fluid and fetal  movement were reviewed in detail with the patient.    Return in about 3 weeks (around 01/31/2019) for ROB and anatomy scan.  Malachy Mood, MD, [facility], [facility] 01/10/2019, 11:35 AM

## 2019-01-13 LAB — NUSWAB VAGINITIS PLUS (VG+)
Atopobium vaginae: HIGH Score — AB
BVAB 2: HIGH Score — AB
Candida albicans, NAA: NEGATIVE
Candida glabrata, NAA: NEGATIVE
Chlamydia trachomatis, NAA: NEGATIVE
Megasphaera 1: HIGH Score — AB
Neisseria gonorrhoeae, NAA: NEGATIVE
Trich vag by NAA: NEGATIVE

## 2019-01-14 ENCOUNTER — Other Ambulatory Visit: Payer: Self-pay | Admitting: Obstetrics and Gynecology

## 2019-01-14 MED ORDER — METRONIDAZOLE 500 MG PO TABS: 500.0000 mg | ORAL_TABLET | Freq: Two times a day (BID) | ORAL | 0 refills | Status: AC

## 2019-02-01 ENCOUNTER — Other Ambulatory Visit: Payer: Self-pay

## 2019-02-01 ENCOUNTER — Ambulatory Visit (INDEPENDENT_AMBULATORY_CARE_PROVIDER_SITE_OTHER): Payer: Medicaid Other | Admitting: Obstetrics and Gynecology

## 2019-02-01 ENCOUNTER — Ambulatory Visit (INDEPENDENT_AMBULATORY_CARE_PROVIDER_SITE_OTHER): Payer: Medicaid Other

## 2019-02-01 VITALS — BP 116/84 | Wt 184.0 lb

## 2019-02-01 DIAGNOSIS — Z3480 Encounter for supervision of other normal pregnancy, unspecified trimester: Secondary | ICD-10-CM

## 2019-02-01 DIAGNOSIS — Z98891 History of uterine scar from previous surgery: Secondary | ICD-10-CM

## 2019-02-01 DIAGNOSIS — Z3A20 20 weeks gestation of pregnancy: Secondary | ICD-10-CM

## 2019-02-01 DIAGNOSIS — Z363 Encounter for antenatal screening for malformations: Secondary | ICD-10-CM

## 2019-02-01 DIAGNOSIS — O34219 Maternal care for unspecified type scar from previous cesarean delivery: Secondary | ICD-10-CM

## 2019-02-01 LAB — POCT URINALYSIS DIPSTICK OB: Glucose, UA: NEGATIVE

## 2019-02-01 NOTE — Addendum Note (Signed)
Addended by: [place], Leala Bryand B on: 02/01/2019 02:31 PM   Modules accepted: Orders

## 2019-02-01 NOTE — Progress Notes (Signed)
ROB  °Anatomy scan °

## 2019-02-01 NOTE — Progress Notes (Signed)
Routine Prenatal Care Visit  Subjective  Tyronza Happe is a 23 y.o. [phone] at [redacted]w[redacted]d being seen today for ongoing prenatal care.  She is currently monitored for the following issues for this low-risk pregnancy and has S/P cesarean section and Supervision of other normal pregnancy, antepartum on their problem list.  ----------------------------------------------------------------------------------- Patient reports no complaints.   Contractions: Not present. Vag. Bleeding: None.  Movement: Present. Denies leaking of fluid.  ----------------------------------------------------------------------------------- The following portions of the patient's history were reviewed and updated as appropriate: allergies, current medications, past family history, past medical history, past social history, past surgical history and problem list. Problem list updated.   Objective  Blood pressure 116/84, weight 184 lb (83.5 kg), last menstrual period 09/10/2018, unknown if currently breastfeeding. Pregravid weight 189 lb (85.7 kg) Total Weight Gain -5 lb (-2.268 kg) Urinalysis:      Fetal Status: Fetal Heart Rate (bpm): 150   Movement: Present     General:  Alert, oriented and cooperative. Patient is in no acute distress.  Skin: Skin is warm and dry. No rash noted.   Cardiovascular: Normal heart rate noted  Respiratory: Normal respiratory effort, no problems with respiration noted  Abdomen: Soft, gravid, appropriate for gestational age. Pain/Pressure: Absent     Pelvic:  Cervical exam deferred        Extremities: Normal range of motion.     ental Status: Normal mood and affect. Normal behavior. Normal judgment and thought content.   US OB Comp + 14 Wk  Result Date: 02/01/2019 Patient Name: Anaiza Behrens DOB: 12-28-1995 MRN: [id number] ULTRASOUND REPORT Location: [facility] Date of Service: 02/01/2019 Indications:Anatomy Ultrasound Findings: Mason Jim intrauterine pregnancy is visualized with FHR at  152 BPM. Biometrics give an (U/S) Gestational age of [redacted]w[redacted]d and an (U/S) EDD of 06/12/2019; this correlates with the clinically established Estimated Date of Delivery: 06/17/19 Fetal presentation is Breech. EFW: 393 g ( 14 oz ). Placenta: fundal. Grade: 1 AFI: subjectively normal. Anatomic survey is complete and normal; Gender - female.  Impression: 1. [redacted]w[redacted]d Viable Singleton Intrauterine pregnancy by U/S. 2. (U/S) EDD is consistent with Clinically established Estimated Date of Delivery: 06/17/19 . 3. Normal Anatomy Scan Recommendations: 1.Clinical correlation with the patient's History and Physical Exam. Deanna Artis, RT  There is a singleton gestation with subjectively normal amniotic fluid volume. The fetal biometry correlates with established dating. Detailed evaluation of the fetal anatomy was performed.The fetal anatomical survey appears within normal limits within the resolution of ultrasound as described above.  It must be noted that a normal ultrasound is unable to rule out fetal aneuploidy, subtle defects such as small ASD or VDS may also not be visible on imaging.  Vena Austria, MD, [facility], [facility] 02/01/2019, 1:52 PM     Assessment   23 y.o. [id number] at [redacted]w[redacted]d by  06/17/2019, by Last Menstrual Period presenting for routine prenatal visit  Plan   pregnancy4 Problems (from 09/10/18 to present)    Problem Noted Resolved   Supervision of other normal pregnancy, antepartum 11/10/2018 by Oswaldo Conroy, CNM No   Overview Addendum 02/01/2019  2:20 PM by Vena Austria, MD    Clinic [facility] Prenatal Labs  Dating  LMP=10 wk [place] Blood type: A/Positive/-- (10/19 1136)   Genetic Screen declines Antibody:Negative (10/19 1136)  Anatomic [place] normal Rubella: 2.66 (10/19 1136) Varicella: Immune  GTT Early: 96 Third trimester:  RPR: Non Reactive (10/19 1136)   Rhogam N/A HBsAg: Negative (10/19 1136)  TDaP vaccine                       Flu Shot: HIV: Non Reactive  (10/19 1136)   Baby Food                                GBS:   Contraception  Pap: 11/09/2018, NILM  CBB     CS/VBAC    Support Person                 Gestational age appropriate obstetric precautions including but not limited to vaginal bleeding, contractions, leaking of fluid and fetal movement were reviewed in detail with the patient.    Return in about 4 weeks (around 03/01/2019) for ROB.  Malachy Mood, MD, [facility], [facility] 02/01/2019, 2:20 PM

## 2019-03-01 ENCOUNTER — Encounter: Payer: Medicaid Other | Admitting: Certified Nurse Midwife

## 2019-03-04 ENCOUNTER — Other Ambulatory Visit: Payer: Self-pay

## 2019-03-04 ENCOUNTER — Encounter: Payer: Medicaid Other | Admitting: Advanced Practice Midwife

## 2019-03-13 ENCOUNTER — Observation Stay
Admission: EM | Admit: 2019-03-13 | Discharge: 2019-03-13 | Disposition: A | Discharge status: Home / Self Care | Payer: Medicaid Other | Attending: Certified Nurse Midwife | Admitting: Certified Nurse Midwife

## 2019-03-13 ENCOUNTER — Other Ambulatory Visit: Payer: Self-pay

## 2019-03-13 ENCOUNTER — Encounter: Payer: Self-pay | Admitting: Obstetrics and Gynecology

## 2019-03-13 DIAGNOSIS — Z3480 Encounter for supervision of other normal pregnancy, unspecified trimester: Secondary | ICD-10-CM

## 2019-03-13 DIAGNOSIS — O23592 Infection of other part of genital tract in pregnancy, second trimester: Secondary | ICD-10-CM | POA: Diagnosis not present

## 2019-03-13 DIAGNOSIS — O26892 Other specified pregnancy related conditions, second trimester: Secondary | ICD-10-CM | POA: Diagnosis not present

## 2019-03-13 DIAGNOSIS — Z3A26 26 weeks gestation of pregnancy: Secondary | ICD-10-CM | POA: Insufficient documentation

## 2019-03-13 DIAGNOSIS — B9689 Other specified bacterial agents as the cause of diseases classified elsewhere: Secondary | ICD-10-CM | POA: Diagnosis not present

## 2019-03-13 DIAGNOSIS — R102 Pelvic and perineal pain: Secondary | ICD-10-CM | POA: Diagnosis not present

## 2019-03-13 LAB — CHLAMYDIA/NGC RT PCR (ARMC ONLY)??????????
Chlamydia Tr: NOT DETECTED
N gonorrhoeae: NOT DETECTED

## 2019-03-13 LAB — CHLAMYDIA/NGC RT PCR (ARMC ONLY)

## 2019-03-13 MED ORDER — METRONIDAZOLE 500 MG PO TABS: 500.0000 mg | ORAL_TABLET | Freq: Two times a day (BID) | ORAL | 0 refills | Status: DC

## 2019-03-13 NOTE — Final Progress Note (Signed)
Physician Final Progress Note  Patient ID: Jessica Randall MRN: [id number] DOB/AGE: 03-23-1995 23 y.o.  Admit date: 03/13/2019 Admitting provider: Will Bonnet, MD/ Jesus Genera. Danise Mina, CNM Discharge date: 03/13/2019   Admission Diagnoses: Pelvic pain and vaginal discharge at [facility]  Discharge Diagnoses:  Active Problems:   Pelvic pain affecting pregnancy in second trimester, antepartum Bacterial vaginosis  Consults: None  Significant Findings/ Diagnostic Studies:   HPI:  Jessica Randall is a 24 y.o. [phone] female with EDC= 06/17/2019 at [redacted]w[redacted]d gestation  dated by LMP and confirmed with a 11wk 5d ultrasound.  Her pregnancy has been complicated by a history of a previous Cesarean section for nonreassuring fetal heart tracing. .  She presents to L&D for evaluation of lower vagina/groin area bilaterally.  She rates this pain as 10/10 when standing and walking.  When lying down pain is relieved.  She took tylenol on Friday with some improvement and able to sleep that night.  She denies any pain or burning with urination.  She describes vaginal discharge has increased, yellow in color and has odor. She reports working 8-9 hours a day at [employer] and her boss has required her to work 6 of the last 7 days. Baby is moving well. Denies contractions or vaginal bleeding. No vulvar itching or irritation. Prenatal care site: Prenatal care at [facility] has also  been remarkable for  [facility] Prenatal Labs  Dating  LMP=10 wk [place] Blood type: A/Positive/-- (10/19 1136)   Genetic Screen declines Antibody:Negative (10/19 1136)  Anatomic [place] normal Rubella: 2.66 (10/19 1136) Varicella: Immune  GTT Early: 96 Third trimester:  RPR: Non Reactive (10/19 1136)   Rhogam N/A HBsAg: Negative (10/19 1136)   TDaP vaccine                       Flu Shot: HIV: Non Reactive (10/19 1136)   Baby Food                                GBS:   Contraception  Pap: 11/09/2018, NILM  CBB     CS/VBAC    Support  Person         She missed her January appointment for a ROB.     Maternal Medical History:   Past Medical History:  Diagnosis Date  . Anemia   . Anxiety and depression    history of SI  . Cholelithiasis affecting pregnancy in third trimester, antepartum 05/12/2017  . History of drug use    MJ  . History of gonorrhea    with G2  . Pain in symphysis pubis during pregnancy 01/30/2017  . Stroke-like symptoms 03/28/2015  . TIA (transient ischemic attack) 03/28/2015    Past Surgical History:  Procedure Laterality Date  . CESAREAN SECTION N/A 06/18/2017   Procedure: CESAREAN SECTION;  Surgeon: Homero Fellers, MD;  Location: [facility] ORS;  Service: Obstetrics;  Laterality: N/A;  . CHOLECYSTECTOMY Right 05/15/2017    No Known Allergies  Prior to Admission medications   Medication Sig Start Date End Date Taking? Authorizing Provider  Butalbital-APAP-Caffeine 50-325-40 MG capsule Take 1 capsule by mouth every 6 (six) hours as needed for headache. Patient not taking: Reported on 03/13/2019 12/16/18   Homero Fellers, MD                 Social History: She  reports that she has never smoked. She has  never used smokeless tobacco. She reports that she does not drink alcohol or use drugs.  Family History: family history includes Breast cancer in her paternal grandmother; Diabetes in her maternal grandmother; Hyperlipidemia in her maternal grandfather and mother; Hypertension in her maternal grandmother and mother; Hyperthyroidism in her mother; Lupus in her mother; Transient ischemic attack in her maternal grandmother and paternal grandmother.   Review of Systems: Negative x 10 systems reviewed except as noted in the HPI.      Physical Exam:  Vital Signs: BP (!) 119/59 (BP Location: Left Arm)   Pulse 87   Temp 98.2 F (36.8 C) (Oral)   Resp 14   Ht 5\' 1"  (1.549 m)   Wt 83 kg   LMP 09/10/2018   BMI 34.58 kg/m  General: gravid BF in no acute distress.  HEENT:  normocephalic, atraumatic Heart: regular rate  Lungs: normal respiratory effort Abdomen: soft, gravid, non-tender; well healed CS scar on lower abdomen Pelvic:   External: Normal external female genitalia  Vagina: thin white homogenous discharge  Cervix: Dilation: Closed / Effacement (%): Thick / Station: 11/10/2018: + clue cells; - trichomonas;  - yeast   Extremities: non-tender, symmetric, no edema bilaterally.   Neurologic: Alert & oriented x 3.   Baseline FHR: 140s Toco: no contractions seen     Assessment:  Jessica Randall is a 24 y.o. [phone] female at [redacted]w[redacted]d with pelvic pain/ pressure  Bacterial vaginosis Plan:  1. Discussed use of maternity support at work/ Tylenol  2. Work note to excuse from work today and to decrease hours at work 3. Flagyl 500 mgm po BID x 7 days with food, no alcohol 4  Follow up at [facility] for ROB in 1 week     Procedures: none  Discharge Condition: good  Disposition: Discharge disposition: 01-Home or Self Care       Diet: Regular diet  Discharge Activity: Activity as tolerated  Discharge Instructions    Discharge patient   Complete by: As directed    Discharge disposition: 01-Home or Self Care   Discharge patient date: 03/13/2019     Allergies as of 03/13/2019   No Known Allergies     Medication List    TAKE these medications   Butalbital-APAP-Caffeine 50-325-40 MG capsule Take 1 capsule by mouth every 6 (six) hours as needed for headache.   metroNIDAZOLE 500 MG tablet Commonly known as: FLAGYL Take 1 tablet (500 mg total) by mouth 2 (two) times daily.      Follow-up Information    [facility]. Schedule an appointment as soon as possible for a visit.   Specialty: Obstetrics and Gynecology Contact information: [address] [place] [phone]          Total time spent taking care of this patient: 20 minutes  Signed: [phone] 03/13/2019, 4:03  PM

## 2019-03-13 NOTE — Discharge Instructions (Signed)
Please call the office for follow-up appointment.  If you have any questions or concerns you may call the on call provider.  You may also call the nurse's desk at [facility] with questions at [phone].  If you have emergent concerns please go to the nearest ED for assessment.

## 2019-03-13 NOTE — OB Triage Note (Addendum)
Patient complains of lower vagina/groin area bilaterally.  She rates this pain as 10/10 when standing and walking.  When lying down pain is relieved.  She took tylenol on Friday with some improvement and able to sleep that night.  She denies any pain or burning with urination.  She describes vaginal discharge has increased, yellow in color and has odor. Cervix closed. BV dx by CGutierrez CNM. Prescription called to pharmacy.  Patient to call offiice to schedule appointment.

## 2019-03-31 ENCOUNTER — Encounter: Payer: Medicaid Other | Admitting: Advanced Practice Midwife

## 2019-04-01 ENCOUNTER — Encounter: Payer: Medicaid Other | Admitting: Certified Nurse Midwife

## 2019-04-02 ENCOUNTER — Emergency Department: Payer: Medicaid Other

## 2019-04-02 ENCOUNTER — Encounter: Payer: Self-pay | Admitting: Emergency Medicine

## 2019-04-02 ENCOUNTER — Emergency Department
Admission: EM | Admit: 2019-04-02 | Discharge: 2019-04-03 | Disposition: A | Discharge status: Home / Self Care | Payer: Medicaid Other | Attending: Emergency Medicine | Admitting: Emergency Medicine

## 2019-04-02 ENCOUNTER — Other Ambulatory Visit: Payer: Self-pay

## 2019-04-02 DIAGNOSIS — Z3A29 29 weeks gestation of pregnancy: Secondary | ICD-10-CM | POA: Diagnosis not present

## 2019-04-02 DIAGNOSIS — O99891 Other specified diseases and conditions complicating pregnancy: Secondary | ICD-10-CM | POA: Diagnosis present

## 2019-04-02 DIAGNOSIS — E876 Hypokalemia: Secondary | ICD-10-CM | POA: Diagnosis not present

## 2019-04-02 DIAGNOSIS — R200 Anesthesia of skin: Secondary | ICD-10-CM | POA: Diagnosis not present

## 2019-04-02 DIAGNOSIS — R252 Cramp and spasm: Secondary | ICD-10-CM | POA: Diagnosis not present

## 2019-04-02 LAB — COMPREHENSIVE METABOLIC PANEL
ALT: 11 U/L (ref 0–44)
AST: 13 U/L — ABNORMAL LOW (ref 15–41)
Albumin: 3.1 g/dL — ABNORMAL LOW (ref 3.5–5.0)
Alkaline Phosphatase: 123 U/L (ref 38–126)
Anion gap: 10 (ref 5–15)
BUN: 6 mg/dL (ref 6–20)
CO2: 18 mmol/L — ABNORMAL LOW (ref 22–32)
Calcium: 8.8 mg/dL — ABNORMAL LOW (ref 8.9–10.3)
Chloride: 107 mmol/L (ref 98–111)
Creatinine, Ser: 0.5 mg/dL (ref 0.44–1.00)
GFR calc Af Amer: >60 mL/min (ref >60)
GFR calc non Af Amer: >60 mL/min (ref >60)
Glucose, Bld: 83 mg/dL (ref 70–99)
Potassium: 3 mmol/L — ABNORMAL LOW (ref 3.5–5.1)
Sodium: 135 mmol/L (ref 135–145)
Total Bilirubin: 0.5 mg/dL (ref 0.3–1.2)
Total Protein: 7.1 g/dL (ref 6.5–8.1)

## 2019-04-02 LAB — CBC WITH DIFFERENTIAL/PLATELET
Abs Immature Granulocytes: 0.05 10*3/uL (ref 0.00–0.07)
Basophils Absolute: 0 10*3/uL (ref 0.0–0.1)
Basophils Relative: 0 %
Eosinophils Absolute: 0.1 10*3/uL (ref 0.0–0.5)
Eosinophils Relative: 1 %
HCT: 30.1 % — ABNORMAL LOW (ref 36.0–46.0)
Hemoglobin: 10 g/dL — ABNORMAL LOW (ref 12.0–15.0)
Immature Granulocytes: 1 %
Lymphocytes Relative: 16 %
Lymphs Abs: 1.7 10*3/uL (ref 0.7–4.0)
MCH: 27.4 pg (ref 26.0–34.0)
MCHC: 33.2 g/dL (ref 30.0–36.0)
MCV: 82.5 fL (ref 80.0–100.0)
Monocytes Absolute: 0.7 10*3/uL (ref 0.1–1.0)
Monocytes Relative: 7 %
Neutro Abs: 7.9 10*3/uL — ABNORMAL HIGH (ref 1.7–7.7)
Neutrophils Relative %: 75 %
Platelets: 283 10*3/uL (ref 150–400)
RBC: 3.65 MIL/uL — ABNORMAL LOW (ref 3.87–5.11)
RDW: 12.5 % (ref 11.5–15.5)
WBC: 10.4 10*3/uL (ref 4.0–10.5)
nRBC: 0 % (ref 0.0–0.2)

## 2019-04-02 LAB — CK: Total CK: 57 U/L (ref 38–234)

## 2019-04-02 LAB — TROPONIN I (HIGH SENSITIVITY): Troponin I (High Sensitivity): 3 ng/L (ref <18)

## 2019-04-02 MED ORDER — POTASSIUM CHLORIDE 10 MEQ/100ML IV SOLN
10.0000 meq | Freq: Once | INTRAVENOUS | Status: AC
  Administered 2019-04-02: 23:00:00 10 meq via INTRAVENOUS
  Filled 2019-04-02: qty 100

## 2019-04-02 MED ORDER — POTASSIUM CHLORIDE CRYS ER 20 MEQ PO TBCR: 20.0000 meq | EXTENDED_RELEASE_TABLET | Freq: Every day | ORAL | 0 refills | Status: DC

## 2019-04-02 MED ORDER — ACETAMINOPHEN 325 MG PO TABS
650.0000 mg | ORAL_TABLET | Freq: Once | ORAL | Status: AC
  Administered 2019-04-02: 650 mg via ORAL
  Filled 2019-04-02: qty 2

## 2019-04-02 MED ORDER — POTASSIUM CHLORIDE CRYS ER 20 MEQ PO TBCR
40.0000 meq | EXTENDED_RELEASE_TABLET | Freq: Once | ORAL | Status: AC
  Administered 2019-04-02: 40 meq via ORAL
  Filled 2019-04-02: qty 2

## 2019-04-02 NOTE — ED Provider Notes (Signed)
[facility] EMERGENCY DEPARTMENT Provider Note   CSN: [id number] Arrival date & time: 04/02/19  1851     History Chief Complaint  Patient presents with  . Numbness    Jessica Randall is a 24 y.o. female [redacted] weeks pregnant here presenting with numbness in the right leg.  Patient states that since yesterday she has some numbness and weakness in the right leg.  She states that she has pain in the leg.  She also states that her right leg gives out when she tries to walk.  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  Upon review of her record, she actually had a negative MRI and MRA of the brain.  She was discharged the following day. Patient states that she has no trouble speaking or upper extremity symptoms today .  She was concerned that she may have another mini stroke.  She has a history of preeclampsia.  He follows up with [facility] currently.  The history is provided by the patient.       Past Medical History:  Diagnosis Date  . Anemia   . Anxiety and depression    history of SI  . Cholelithiasis affecting pregnancy in third trimester, antepartum 05/12/2017  . History of drug use    MJ  . History of gonorrhea    with G2  . Pain in symphysis pubis during pregnancy 01/30/2017  . Stroke-like symptoms 03/28/2015  . TIA (transient ischemic attack) 03/28/2015    Patient Active Problem List   Diagnosis Date Noted  . Pelvic pain affecting pregnancy in second trimester, antepartum 03/13/2019  . Supervision of other normal pregnancy, antepartum 11/10/2018  . S/P cesarean section 06/18/2017    Past Surgical History:  Procedure Laterality Date  . CESAREAN SECTION N/A 06/18/2017   Procedure: CESAREAN SECTION;  Surgeon: Natale Milch, MD;  Location: [facility] ORS;  Service: Obstetrics;  Laterality: N/A;  . CHOLECYSTECTOMY Right 05/15/2017     OB History    Gravida  4   Para  3   Term  3   Preterm  0   AB  0   Living  3     SAB      TAB      Ectopic      Multiple  0   Live Births  3        Obstetric Comments  01/2012: 7lbs 3oz (Dr. Tiburcio Pea, [facility]) median episotomy; type of tear not documented.         Family History  Problem Relation Age of Onset  . Lupus Mother   . Hypertension Mother   . Hyperthyroidism Mother   . Hyperlipidemia Mother   . Hypertension Maternal Grandmother   . Diabetes Maternal Grandmother   . Transient ischemic attack Maternal Grandmother   . Hyperlipidemia Maternal Grandfather   . Breast cancer Paternal Grandmother   . Transient ischemic attack Paternal Grandmother     Social History   Tobacco Use  . Smoking status: Never Smoker  . Smokeless tobacco: Never Used  Substance Use Topics  . Alcohol use: No    Alcohol/week: 0.0 standard drinks    Comment: occ.   . Drug use: No    Comment: h/o THC use    Home Medications Prior to Admission medications   Medication Sig Start Date End Date Taking? Authorizing Provider  [employer]  50-325-40 MG capsule Take 1 capsule by mouth every 6 (six) hours as needed for headache. Patient not taking: Reported on 03/13/2019 12/16/18   Homero Fellers, MD  metroNIDAZOLE (FLAGYL) 500 MG tablet Take 1 tablet (500 mg total) by mouth 2 (two) times daily. 03/13/19   Dalia Heading, CNM    Allergies    Patient has no known allergies.  Review of Systems   Review of Systems  Musculoskeletal:       R leg pain and weakness   All other systems reviewed and are negative.   Physical Exam Updated Vital Signs BP 112/63   Pulse 88   Temp 98.2 F (36.8 C)   Resp 16   Ht 5\' 1"  (1.549 m)   Wt 82.7 kg   LMP 09/10/2018   SpO2 99%   BMI 34.45 kg/m   Physical Exam Vitals and nursing note reviewed.  HENT:     Head: Normocephalic.     Right Ear: Tympanic membrane normal.     Left Ear: Tympanic membrane normal.     Nose: Nose  normal.     Mouth/Throat:     Mouth: Mucous membranes are moist.  Eyes:     Extraocular Movements: Extraocular movements intact.     Pupils: Pupils are equal, round, and reactive to light.  Cardiovascular:     Rate and Rhythm: Normal rate.     Pulses: Normal pulses.  Pulmonary:     Effort: Pulmonary effort is normal.  Abdominal:     General: Abdomen is flat.     Palpations: Abdomen is soft.     Comments: Gravid uterus, nontender   Musculoskeletal:     Cervical back: Normal range of motion.     Comments: Strength 4/5 R hip flexion and extension, 4/5 R knee flexion and extension, 4/5 R plantar flexion, nl reflexes. Exam limited by pain. No spinal tenderness, no saddle anesthesia. Nl neuro exam otherwise   Skin:    General: Skin is warm.     Capillary Refill: Capillary refill takes less than 2 seconds.  Neurological:     General: No focal deficit present.     Mental Status: She is alert and oriented to person, place, and time.     Comments: No saddle anesthesia, CN 2- 12 intact   Psychiatric:        Mood and Affect: Mood normal.     ED Results / Procedures / Treatments   Labs (all labs ordered are listed, but only abnormal results are displayed) Labs Reviewed  CBC WITH DIFFERENTIAL/PLATELET - Abnormal; Notable for the following components:      Result Value   RBC 3.65 (*)    Hemoglobin 10.0 (*)    HCT 30.1 (*)    Neutro Abs 7.9 (*)    All other components within normal limits  COMPREHENSIVE METABOLIC PANEL - Abnormal; Notable for the following components:   Potassium 3.0 (*)    CO2 18 (*)    Calcium 8.8 (*)    Albumin 3.1 (*)    AST 13 (*)    All other components within normal limits  CK  TROPONIN I (HIGH SENSITIVITY)    EKG None  Radiology MR BRAIN WO CONTRAST  Result Date: 04/02/2019 CLINICAL DATA:  Right-sided numbness and weakness. EXAM: MRI HEAD WITHOUT CONTRAST TECHNIQUE: Multiplanar, multiecho pulse sequences of the brain and surrounding structures were  obtained without intravenous contrast. COMPARISON:  Head CT 01/09/2017 and MRI 03/28/2015 FINDINGS: Brain: There is no evidence  of acute infarct, intracranial hemorrhage, mass, midline shift, or extra-axial fluid collection. The ventricles and sulci are normal. The brain is normal in signal. Vascular: Major intracranial vascular flow voids are preserved. Skull and upper cervical spine: No suspicious marrow lesion. Sinuses/Orbits: Unremarkable orbits. Paranasal sinuses and mastoid air cells are clear. Other: None. IMPRESSION: Negative brain MRI. Electronically Signed   By: Sebastian Ache M.D.   On: 04/02/2019 21:47   MR LUMBAR SPINE WO CONTRAST  Result Date: 04/02/2019 CLINICAL DATA:  Right leg weakness and numbness. Currently [redacted] weeks pregnant. EXAM: MRI LUMBAR SPINE WITHOUT CONTRAST TECHNIQUE: Multiplanar, multisequence MR imaging of the lumbar spine was performed. No intravenous contrast was administered. COMPARISON:  None. FINDINGS: The study is mildly motion degraded. Segmentation: Standard. Alignment:  Normal. Vertebrae: No fracture, suspicious osseous lesion, significant marrow edema, or evidence of discitis. Diffusely diminished bone marrow T1 signal intensity which may be related to the patient's known anemia. Conus medullaris and cauda equina: Conus extends to the L1 level. Conus and cauda equina appear normal. Paraspinal and other soft tissues: Partially visualized gravid uterus. Disc levels: Disc height and hydration are preserved throughout the lumbar spine. No disc herniation or significant facet arthropathy is identified, and the spinal canal and neural foramina are widely patent. IMPRESSION: No acute abnormality, disc herniation, or evidence of neural impingement in the lumbar spine. Electronically Signed   By: Sebastian Ache M.D.   On: 04/02/2019 21:52    Procedures Procedures (including critical care time)  Medications Ordered in ED Medications  potassium chloride 10 mEq in 100 mL IVPB (10  mEq Intravenous New Bag/Given 04/02/19 2236)  acetaminophen (TYLENOL) tablet 650 mg (650 mg Oral Given 04/02/19 1934)  potassium chloride SA (KLOR-CON) CR tablet 40 mEq (40 mEq Oral Given 04/02/19 2209)    ED Course  I have reviewed the triage vital signs and the nursing notes.  Pertinent labs & imaging results that were available during my care of the patient were reviewed by me and considered in my medical decision making (see chart for details).    MDM Rules/Calculators/A&P                      Jessica Randall is a 24 y.o. female here presenting with right leg numbness and weakness.  She seemed to have some mild symptoms there.  I wonder if she has some radiculopathy from the gravid uterus.  She has no OB complaints and has no vaginal bleeding or discharge.  Patient was concerned that she may have a stroke.  I reassured her that last time she had a negative MRI. Will get MRI of the brain and lumbar spine. I have low suspicion of venous dural thrombosis as she has no headache currently. She also had MRV at that time that did not show any venous dural thrombosis.  10:55 PM K 3.0, supplemented. MRI brain and MR lumbar spine unremarkable. Her numbness and cramps likely from hypokalemia. Will dc home with potassium pills. Will have her recheck potassium in a week   Final Clinical Impression(s) / ED Diagnoses Final diagnoses:  None    Rx / DC Orders ED Discharge Orders    None       Charlynne Pander, MD 04/02/19 2307

## 2019-04-02 NOTE — Discharge Instructions (Signed)
Your potassium is slightly low and that can cause cramps   Take potassium as prescribed   Recheck potassium level in a week   Return to ER if you have worse cramps, vaginal bleeding, vomiting, fever

## 2019-04-02 NOTE — ED Triage Notes (Signed)
Pt states she had a stroke after her 2nd child. Numbness on right leg. Was able to move arm when moving to the cot but not when asked to. Negative stroke scale for ems.

## 2019-04-04 ENCOUNTER — Telehealth: Payer: Self-pay

## 2019-04-04 NOTE — Telephone Encounter (Signed)
Pt called triage on 04/02/19 reporting numbness in leg, she has a past history of a stroke, she was advised by the triage nurse to call 911 or go to er, I called pt today to f/up, no answer left message to return call

## 2019-04-07 ENCOUNTER — Encounter: Payer: Self-pay | Admitting: Certified Nurse Midwife

## 2019-04-07 ENCOUNTER — Other Ambulatory Visit: Payer: Self-pay

## 2019-04-07 ENCOUNTER — Ambulatory Visit (INDEPENDENT_AMBULATORY_CARE_PROVIDER_SITE_OTHER): Payer: Medicaid Other | Admitting: Certified Nurse Midwife

## 2019-04-07 VITALS — BP 110/70 | Wt 182.0 lb

## 2019-04-07 DIAGNOSIS — O99353 Diseases of the nervous system complicating pregnancy, third trimester: Secondary | ICD-10-CM

## 2019-04-07 DIAGNOSIS — Z3A29 29 weeks gestation of pregnancy: Secondary | ICD-10-CM

## 2019-04-07 DIAGNOSIS — Z3480 Encounter for supervision of other normal pregnancy, unspecified trimester: Secondary | ICD-10-CM

## 2019-04-07 DIAGNOSIS — Z113 Encounter for screening for infections with a predominantly sexual mode of transmission: Secondary | ICD-10-CM

## 2019-04-07 DIAGNOSIS — O34219 Maternal care for unspecified type scar from previous cesarean delivery: Secondary | ICD-10-CM

## 2019-04-07 DIAGNOSIS — O09893 Supervision of other high risk pregnancies, third trimester: Secondary | ICD-10-CM

## 2019-04-07 DIAGNOSIS — Z8673 Personal history of transient ischemic attack (TIA), and cerebral infarction without residual deficits: Secondary | ICD-10-CM

## 2019-04-07 DIAGNOSIS — Z131 Encounter for screening for diabetes mellitus: Secondary | ICD-10-CM

## 2019-04-07 LAB — POCT URINALYSIS DIPSTICK OB
Glucose, UA: NEGATIVE
Glucose, UA: NEGATIVE

## 2019-04-07 NOTE — Progress Notes (Signed)
C/o no appetite; can't sleep; went to ED b/c right side of body shut down again - had stroke after she had child.rj

## 2019-04-07 NOTE — Progress Notes (Signed)
ROB at [id number]: Good FM. Was seen yesterday in ER for weakness in the right leg and hip area. 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  Never received Magnesium sulfate. Having BH contractions when active. She has a history of 2 vaginal deliveries followed by a LTCS for a non reassuring FH tracing. She desires a repeat Cesarean section. She is unsure whether she wants a BTL. Has not signed 30 day papers. PLans of breast feeding.  Has her toddler with her today, limiting conversation today FH 30 cm with FHT 136 Has not done 28 week labs  P: Scheduled repeat CS for 14 Jun 2019 Discussed options for permanent sterilization vs [facility] Ready Set Baby info given to patient. 28 week labs ASAP and  ROB in 2 weeks.  [name], CNM

## 2019-04-08 ENCOUNTER — Telehealth: Payer: Self-pay | Admitting: Certified Nurse Midwife

## 2019-04-08 NOTE — Telephone Encounter (Signed)
Patient is calling for labs results. Please advise. 

## 2019-04-08 NOTE — Telephone Encounter (Signed)
Patient is returning missed call. Patient reports having some issues with her phone.

## 2019-04-11 NOTE — Telephone Encounter (Signed)
-----   Message from [name], [place] sent at 04/07/2019  9:52 PM EST ----- Regarding: schedule surgery Surgery Booking Request Patient Full Name:  Jessica Randall  MRN: [id number]  DOB: [birth year]  Surgeon: [name], CNM  Requested Surgery Date and Time: 14 Jun 2019 Primary Diagnosis AND Code: Previous Cesarean section Secondary Diagnosis and Code: Desires repeat Surgical Procedure: Cesarean Section L&D Notification: Yes Admission Status: surgery admit Length of Surgery: 1-2 hours Special Case Needs: No H&P: Yes, needs to be scheduled Phone Interview???:  Yes Interpreter: No Language:  Medical Clearance:  No Special Scheduling Instructions: No Any known health/anesthesia issues, diabetes, sleep apnea, latex allergy, defibrillator/pacemaker?: No Acuity: P1   (P1 highest, P2 delay may cause harm, P3 low, elective gyn, P4 lowest)

## 2019-04-11 NOTE — Telephone Encounter (Signed)
-----   Message from Colleen Gutierrez, CNM sent at 04/07/2019  9:52 PM EST ----- Regarding: schedule surgery Surgery Booking Request Patient Full Name:  Jessica Randall  MRN: [id number]  DOB: 03/05/1995  Surgeon: Colleen Gutierrez, CNM  Requested Surgery Date and Time: 14 Jun 2019 Primary Diagnosis AND Code: Previous Cesarean section Secondary Diagnosis and Code: Desires repeat Surgical Procedure: Cesarean Section L&D Notification: Yes Admission Status: surgery admit Length of Surgery: 1-2 hours Special Case Needs: No H&P: Yes, needs to be scheduled Phone Interview???:  Yes Interpreter: No Language:  Medical Clearance:  No Special Scheduling Instructions: No Any known health/anesthesia issues, diabetes, sleep apnea, latex allergy, defibrillator/pacemaker?: No Acuity: P1   (P1 highest, P2 delay may cause harm, P3 low, elective gyn, P4 lowest)    

## 2019-04-11 NOTE — Telephone Encounter (Signed)
-----   Message from Colleen Gutierrez, CNM sent at 04/07/2019  9:52 PM EST ----- Regarding: schedule surgery Surgery Booking Request Patient Full Name:  Jessica Randall  MRN: [id number]  DOB: 02/09/1995  Surgeon: Colleen Gutierrez, CNM  Requested Surgery Date and Time: 14 Jun 2019 Primary Diagnosis AND Code: Previous Cesarean section Secondary Diagnosis and Code: Desires repeat Surgical Procedure: Cesarean Section L&D Notification: Yes Admission Status: surgery admit Length of Surgery: 1-2 hours Special Case Needs: No H&P: Yes, needs to be scheduled Phone Interview???:  Yes Interpreter: No Language:  Medical Clearance:  No Special Scheduling Instructions: No Any known health/anesthesia issues, diabetes, sleep apnea, latex allergy, defibrillator/pacemaker?: No Acuity: P1   (P1 highest, P2 delay may cause harm, P3 low, elective gyn, P4 lowest)    

## 2019-04-11 NOTE — Telephone Encounter (Signed)
Confirmed c/s on 5/11 w/ Dr Jean Rosenthal H&P 4/30 @ 4:10pm Covid testing 5/10, [address], drive up and wear mask. Adv pt to quar after testing until DOS.  Adv pt that she will also have a pre-admit phone call and that appt info will be on H&P paperwork. Also may receive calls from hosp pharmacy and pt ctr.

## 2019-04-11 NOTE — Telephone Encounter (Signed)
LM for pt to rtn call. 

## 2019-04-12 ENCOUNTER — Other Ambulatory Visit: Payer: Medicaid Other

## 2019-04-21 ENCOUNTER — Other Ambulatory Visit: Payer: Medicaid Other

## 2019-04-21 ENCOUNTER — Encounter: Payer: Medicaid Other | Admitting: Obstetrics and Gynecology

## 2019-04-29 ENCOUNTER — Ambulatory Visit (INDEPENDENT_AMBULATORY_CARE_PROVIDER_SITE_OTHER): Payer: Medicaid Other | Admitting: Certified Nurse Midwife

## 2019-04-29 ENCOUNTER — Other Ambulatory Visit: Payer: Medicaid Other

## 2019-04-29 ENCOUNTER — Other Ambulatory Visit: Payer: Self-pay

## 2019-04-29 ENCOUNTER — Encounter: Payer: Self-pay | Admitting: Certified Nurse Midwife

## 2019-04-29 VITALS — BP 102/70 | Wt 180.0 lb

## 2019-04-29 DIAGNOSIS — O99613 Diseases of the digestive system complicating pregnancy, third trimester: Secondary | ICD-10-CM

## 2019-04-29 DIAGNOSIS — R12 Heartburn: Secondary | ICD-10-CM

## 2019-04-29 DIAGNOSIS — Z23 Encounter for immunization: Secondary | ICD-10-CM

## 2019-04-29 DIAGNOSIS — Z3A33 33 weeks gestation of pregnancy: Secondary | ICD-10-CM

## 2019-04-29 DIAGNOSIS — Z3480 Encounter for supervision of other normal pregnancy, unspecified trimester: Secondary | ICD-10-CM

## 2019-04-29 DIAGNOSIS — Z131 Encounter for screening for diabetes mellitus: Secondary | ICD-10-CM

## 2019-04-29 DIAGNOSIS — Z113 Encounter for screening for infections with a predominantly sexual mode of transmission: Secondary | ICD-10-CM

## 2019-04-29 LAB — POCT URINALYSIS DIPSTICK OB: Glucose, UA: NEGATIVE

## 2019-04-29 MED ORDER — FAMOTIDINE 20 MG PO TABS: 20.0000 mg | ORAL_TABLET | Freq: Two times a day (BID) | ORAL | 3 refills | Status: DC

## 2019-04-29 NOTE — Progress Notes (Signed)
C/o no appetite.rj °

## 2019-04-29 NOTE — Progress Notes (Signed)
ROB at 33 weeks: Having problems with heartburn and early satiety. Still not sure about whether she wants further pregnancies. Her mother is with her today and is encouraging patient to sign papers. Fetal movement is good today. Patient states that some days she is not aware of baby moving.She has a toddler who goes non stop that she needs to run after during the day. She does want her TDAP today and would except a blood transfusion in case of an emergency. Has lost 2 more pounds and has had a total net loss of 9# BP 102/70. FH at 33 cm. FHTs WNL. FM heard when listening with DT.   IUP at 33 weeks S=D  P: TDAP given and BT consent consent signed BTL papers signed. Cautioned patient not to have BTL if she wants to have future pregnancies.  FKC instructions -advised to lie down on her side twice a day, preferably after she has eaten, and note whether she feels the baby move 4 times in one hour. Encouraged her to notify L&D for decreased fetal movement Pepcid 20 mgm BID. Recommended eating small snacks throughout the day ROB in 2 weeks.  [name], CNM

## 2019-04-30 ENCOUNTER — Other Ambulatory Visit: Payer: Self-pay

## 2019-04-30 ENCOUNTER — Observation Stay
Admission: EM | Admit: 2019-04-30 | Discharge: 2019-05-01 | Disposition: A | Discharge status: Home / Self Care | Payer: Medicaid Other | Attending: Obstetrics & Gynecology | Admitting: Obstetrics & Gynecology

## 2019-04-30 ENCOUNTER — Encounter: Payer: Self-pay | Admitting: Obstetrics & Gynecology

## 2019-04-30 DIAGNOSIS — Z8673 Personal history of transient ischemic attack (TIA), and cerebral infarction without residual deficits: Secondary | ICD-10-CM | POA: Diagnosis not present

## 2019-04-30 DIAGNOSIS — Z3A33 33 weeks gestation of pregnancy: Secondary | ICD-10-CM | POA: Insufficient documentation

## 2019-04-30 DIAGNOSIS — O368130 Decreased fetal movements, third trimester, not applicable or unspecified: Secondary | ICD-10-CM | POA: Insufficient documentation

## 2019-04-30 DIAGNOSIS — R102 Pelvic and perineal pain: Secondary | ICD-10-CM | POA: Diagnosis not present

## 2019-04-30 DIAGNOSIS — O26893 Other specified pregnancy related conditions, third trimester: Principal | ICD-10-CM | POA: Diagnosis present

## 2019-04-30 LAB — 28 WEEK RH+PANEL
Basophils Absolute: 0 10*3/uL (ref 0.0–0.2)
Basos: 1 %
EOS (ABSOLUTE): 0.1 10*3/uL (ref 0.0–0.4)
Eos: 1 %
Gestational Diabetes Screen: 108 mg/dL (ref 65–139)
HIV Screen 4th Generation wRfx: NONREACTIVE
Hematocrit: 27.7 % — ABNORMAL LOW (ref 34.0–46.6)
Hemoglobin: 9.5 g/dL — ABNORMAL LOW (ref 11.1–15.9)
Immature Grans (Abs): 0 10*3/uL (ref 0.0–0.1)
Immature Granulocytes: 0 %
Lymphocytes Absolute: 1.4 10*3/uL (ref 0.7–3.1)
Lymphs: 17 %
MCH: 26.6 pg (ref 26.6–33.0)
MCHC: 34.3 g/dL (ref 31.5–35.7)
MCV: 78 fL — ABNORMAL LOW (ref 79–97)
Monocytes Absolute: 0.5 10*3/uL (ref 0.1–0.9)
Monocytes: 6 %
Neutrophils Absolute: 6.3 10*3/uL (ref 1.4–7.0)
Neutrophils: 75 %
Platelets: 282 10*3/uL (ref 150–450)
RBC: 3.57 x10E6/uL — ABNORMAL LOW (ref 3.77–5.28)
RDW: 12.2 % (ref 11.7–15.4)
RPR Ser Ql: NONREACTIVE
WBC: 8.2 10*3/uL (ref 3.4–10.8)

## 2019-04-30 LAB — URINALYSIS, ROUTINE W REFLEX MICROSCOPIC
Bacteria, UA: NONE SEEN
Bilirubin Urine: NEGATIVE
Glucose, UA: NEGATIVE mg/dL
Hgb urine dipstick: NEGATIVE
Ketones, ur: NEGATIVE mg/dL
Nitrite: NEGATIVE
Protein, ur: 30 mg/dL — AB
Specific Gravity, Urine: 1.028 (ref 1.005–1.030)
pH: 6 (ref 5.0–8.0)

## 2019-04-30 LAB — WET PREP, GENITAL
Clue Cells Wet Prep HPF POC: NONE SEEN
Sperm: NONE SEEN
Trich, Wet Prep: NONE SEEN
Yeast Wet Prep HPF POC: NONE SEEN

## 2019-04-30 MED ORDER — ONDANSETRON HCL 4 MG/2ML IJ SOLN: 4.0000 mg | Freq: Four times a day (QID) | INTRAMUSCULAR | Status: DC | PRN

## 2019-04-30 MED ORDER — HYDROXYZINE HCL 50 MG PO TABS
50.0000 mg | ORAL_TABLET | Freq: Four times a day (QID) | ORAL | Status: DC | PRN
  Administered 2019-05-01: 50 mg via ORAL
  Filled 2019-04-30 (×4): qty 1

## 2019-04-30 MED ORDER — LACTATED RINGERS IV SOLN: INTRAVENOUS | Status: DC

## 2019-04-30 MED ORDER — ACETAMINOPHEN 500 MG PO TABS
1000.0000 mg | ORAL_TABLET | Freq: Once | ORAL | Status: AC
  Administered 2019-04-30: 1000 mg via ORAL
  Filled 2019-04-30: qty 2

## 2019-04-30 MED ORDER — BUTORPHANOL TARTRATE 1 MG/ML IJ SOLN
1.0000 mg | INTRAMUSCULAR | Status: DC | PRN
  Administered 2019-04-30: 1 mg via INTRAVENOUS
  Filled 2019-04-30: qty 1

## 2019-04-30 NOTE — OB Triage Note (Signed)
Pt presents to ED after a fall and c/o decreased fetal movement. Pt is a G4P3003 [redacted]w[redacted]d, pt is a prior c/s x1 and desires a repeat c/s with this pregnancy. Pt [place] and running after her toddler which resulted in her falling onto her left hip. Pt reported positive fetal movement initially after the fall but a few minutes later has reported decreased fetal movement. Pt reports abdominal bilateral  Soreness 7/10. Pt also reports she has has vaginal pressure for the past few weeks but feels like it has gotten worse after the fall. Pt denies leaking of fluid, vaginal pressure, or contractions. External monitors applied and assessing. Initial FHT 145. VSS.

## 2019-04-30 NOTE — H&P (Signed)
Obstetrics Admission History & Physical   Fall and Decreased Fetal Movement   HPI:  24 y.o. [id number] @ [redacted]w[redacted]d (06/17/2019, by Last Menstrual Period). Admitted on 04/30/2019:   Patient Active Problem List   Diagnosis Date Noted  . Indication for care in labor and delivery, antepartum 04/30/2019  . Pelvic pain affecting pregnancy in second trimester, antepartum 03/13/2019  . Supervision of other normal pregnancy, antepartum 11/10/2018  . History of cesarean section complicating pregnancy 26/72/0947     Presents for lower abdominal pains and vaginal pains that began 2 weeks ago but have worsened tonight after [place], fell on left hip and side and declines abdominal trauma happening.  Denies vag bleeding.  Some vag d/c reported. Also reports weight loss and low appetite and early satiety.  Pt has h/o NSVD x2 then CS for [place] 26 yrs ago.  Also reports decreased  fetal movements compared to her other pregnancies, still has daily movements but less often then she expects as normal.  Prenatal care at: at [facility]. Pregnancy complicated by prior CS.  ROS: A review of systems was performed and negative, except as stated in the above HPI. PMHx:  Past Medical History:  Diagnosis Date  . Anemia   . Anxiety and depression    history of SI  . Cholelithiasis affecting pregnancy in third trimester, antepartum 05/12/2017  . History of drug use    MJ  . History of gonorrhea    with G2  . Pain in symphysis pubis during pregnancy 01/30/2017  . Stroke-like symptoms 03/28/2015  . TIA (transient ischemic attack) 03/28/2015   PSHx:  Past Surgical History:  Procedure Laterality Date  . CESAREAN SECTION N/A 06/18/2017   Procedure: CESAREAN SECTION;  Surgeon: Homero Fellers, MD;  Location: [facility] ORS;  Service: Obstetrics;  Laterality: N/A;  . CHOLECYSTECTOMY Right 05/15/2017   Medications:  Medications Prior to Admission  Medication Sig Dispense Refill Last Dose  .  Butalbital-APAP-Caffeine 50-325-40 MG capsule Take 1 capsule by mouth every 6 (six) hours as needed for headache. (Patient not taking: Reported on 04/30/2019) 10 capsule 0 Not Taking at Unknown time  . famotidine (PEPCID) 20 MG tablet Take 1 tablet (20 mg total) by mouth 2 (two) times daily. (Patient not taking: Reported on 04/30/2019) 60 tablet 3 Not Taking at Unknown time  . potassium chloride SA (KLOR-CON) 20 MEQ tablet Take 1 tablet (20 mEq total) by mouth daily. (Patient not taking: Reported on 04/30/2019) 5 tablet 0 Not Taking at Unknown time   Allergies: has No Known Allergies. OBHx:  OB History  Gravida Para Term Preterm AB Living  4 3 3  0 0 3  SAB TAB Ectopic Multiple Live Births        0 3    # Outcome Date GA Lbr Len/2nd Weight Sex Delivery Anes PTL Lv  4 Current           3 Term 06/18/17 [redacted]w[redacted]d  2650 g M CS-LTranv Gen  LIV     Complications: Non-reassuring electronic fetal monitoring tracing  2 Term 03/21/15 [redacted]w[redacted]d  3090 g M Vag-Spont  N LIV  1 Term 01/24/12 [redacted]w[redacted]d  3175 g M Vag-Spont  N LIV    Obstetric Comments  01/2012: 7lbs 3oz (Dr. Kenton Kingfisher, [place]) median episotomy; type of tear not documented.   2017 -postpartum preeclampsia   SJG:GEZMOQHU/TMLYYTKPTWSF except as detailed in HPI.Marland Kitchen  No family history of birth defects. Soc Hx: Alcohol: none and Recreational drug use: none  Objective:  There were no vitals filed for this visit. Constitutional: Well nourished, well developed female in no acute distress.  HEENT: normal Skin: Warm and dry.  Cardiovascular:Regular rate and rhythm.   Extremity: trace to 1+ bilateral pedal edema Respiratory: Clear to auscultation bilateral. Normal respiratory effort Abdomen: gravid, ND, FHT present, moderate tenderness on exam in B LQ, no RIQ pain or rebound or guarding Back: no CVAT Neuro: DTRs 2+, Cranial nerves grossly intact Psych: Alert and Oriented x3. No memory deficits. Normal mood and affect.  MS: normal gait, normal bilateral  lower extremity ROM/strength/stability.  Pelvic exam: is not limited by body habitus EGBUS: within normal limits Vagina: within normal limits and with normal mucosa, no blood, scant d/c, No Pooling Cervix: EXTERNAL GENITALIA: normal appearing vulva with no masses, tenderness or lesions CERVIX: 0 cm dilated, 20% effaced, ballottable station MEMBRANES: intact Uterus: No contractions observed for 120 minutes.  Adnexa: not evaluated  EFM:FHR: 140 bpm, variability: moderate,  accelerations:  Present,  decelerations:  Absent Toco: None  A NST procedure was performed with FHR monitoring and a normal baseline established, appropriate time of 20-40 minutes of evaluation, and accels >2 seen w 15x15 characteristics.  Results show a REACTIVE NST.    Perinatal info:  Blood type: A positive Rubella- Immune Varicella -Immune TDaP Given during third trimester of this pregnancy RPR NR / HIV Neg/ HBsAg Neg   Assessment & Plan:   24 y.o. [id number] @ [redacted]w[redacted]d, Admitted on 04/30/2019:Lower abdominal pain in pregnancy with h/o prior 3 pregnancies and 1 prior cesarean; also recent fall.  Will check labs and montior for pain.  Analgesia. FWR, w reactive NST, and no signs of PTL. I do not feel the fall has worsened her pregnancy condition. The pains are more chronic with the pregnancy, possibly worsened but not the root cause.  Will assess for other concerns while she is here but mostly she can follow up as outpatient for checks.  Despite decreased fetal movements reported there is reassuring fetal status at this time, ansd their are some movements documented and felt by pt.  Will continue to try to provide reassurance and education as to expectations with this her fourth pregnancy and also prior CS thast may be adding to her sx's.      Annamarie Major, MD, [facility], [facility] 04/30/2019  11:13 PM

## 2019-05-01 ENCOUNTER — Other Ambulatory Visit: Payer: Self-pay | Admitting: Obstetrics & Gynecology

## 2019-05-01 DIAGNOSIS — O26893 Other specified pregnancy related conditions, third trimester: Secondary | ICD-10-CM | POA: Diagnosis not present

## 2019-05-01 DIAGNOSIS — R102 Pelvic and perineal pain: Secondary | ICD-10-CM | POA: Diagnosis present

## 2019-05-01 DIAGNOSIS — O368130 Decreased fetal movements, third trimester, not applicable or unspecified: Secondary | ICD-10-CM

## 2019-05-01 LAB — COMPREHENSIVE METABOLIC PANEL
ALT: 17 U/L (ref 0–44)
AST: 21 U/L (ref 15–41)
Albumin: 3.1 g/dL — ABNORMAL LOW (ref 3.5–5.0)
Alkaline Phosphatase: 151 U/L — ABNORMAL HIGH (ref 38–126)
Anion gap: 8 (ref 5–15)
BUN: <5 mg/dL — ABNORMAL LOW (ref 6–20)
CO2: 21 mmol/L — ABNORMAL LOW (ref 22–32)
Calcium: 8.6 mg/dL — ABNORMAL LOW (ref 8.9–10.3)
Chloride: 107 mmol/L (ref 98–111)
Creatinine, Ser: 0.42 mg/dL — ABNORMAL LOW (ref 0.44–1.00)
GFR calc Af Amer: >60 mL/min (ref >60)
GFR calc non Af Amer: >60 mL/min (ref >60)
Glucose, Bld: 94 mg/dL (ref 70–99)
Potassium: 2.9 mmol/L — ABNORMAL LOW (ref 3.5–5.1)
Sodium: 136 mmol/L (ref 135–145)
Total Bilirubin: 0.5 mg/dL (ref 0.3–1.2)
Total Protein: 7.3 g/dL (ref 6.5–8.1)

## 2019-05-01 LAB — CBC
HCT: 40.4 % (ref 36.0–46.0)
Hemoglobin: 13.4 g/dL (ref 12.0–15.0)
MCH: 26.5 pg (ref 26.0–34.0)
MCHC: 33.2 g/dL (ref 30.0–36.0)
MCV: 80 fL (ref 80.0–100.0)
Platelets: 204 10*3/uL (ref 150–400)
RBC: 5.05 MIL/uL (ref 3.87–5.11)
RDW: 12.9 % (ref 11.5–15.5)
WBC: 7.1 10*3/uL (ref 4.0–10.5)
nRBC: 0 % (ref 0.0–0.2)

## 2019-05-01 LAB — URINE DRUG SCREEN, QUALITATIVE (ARMC ONLY)
Amphetamines, Ur Screen: NOT DETECTED
Barbiturates, Ur Screen: NOT DETECTED
Benzodiazepine, Ur Scrn: NOT DETECTED
Cannabinoid 50 Ng, Ur ~~LOC~~: NOT DETECTED
Cocaine Metabolite,Ur ~~LOC~~: NOT DETECTED
MDMA (Ecstasy)Ur Screen: NOT DETECTED
Methadone Scn, Ur: NOT DETECTED
Opiate, Ur Screen: NOT DETECTED
Phencyclidine (PCP) Ur S: NOT DETECTED
Tricyclic, Ur Screen: NOT DETECTED

## 2019-05-01 LAB — MAGNESIUM: Magnesium: 1.9 mg/dL (ref 1.7–2.4)

## 2019-05-01 NOTE — Discharge Summary (Signed)
Physician Discharge Summary  Patient ID: Jessica Randall MRN: [id number] DOB/AGE: Jan 14, 1996 24 y.o.  Admit date: 04/30/2019 Discharge date: 05/01/2019  Admission Diagnoses:Principal Problem:   Pelvic pain affecting pregnancy in third trimester, antepartum  Discharge Diagnoses:  Principal Problem:   Pelvic pain affecting pregnancy in third trimester, antepartum  Discharged Condition: good  Hospital Course: Patient presented for evaluation of labor.  Patient had cervical exam by self as well as other relevant exam and labs related to her pain symptoms. I reviewed her vital signs and fetal tracing, both of which were reassuring.  Patient was discharge as she was not laboring and no other evidence for her pain noted other than pregnancy, prior CS (possible adhesion type pain), round ligament pain, early satiety related to pregnancy (labs normal for GB, liver).  No s/sx PTL.  Consults: None  Significant Diagnostic Studies: labs:  Results for orders placed or performed during the hospital encounter of 04/30/19  Wet prep, genital   Specimen: Cervix  Result Value Ref Range   Yeast Wet Prep HPF POC NONE SEEN NONE SEEN   Trich, Wet Prep NONE SEEN NONE SEEN   Clue Cells Wet Prep HPF POC NONE SEEN NONE SEEN   WBC, Wet Prep HPF POC MANY (A) NONE SEEN   Sperm NONE SEEN   Urinalysis, Routine w reflex microscopic  Result Value Ref Range   Color, Urine AMBER (A) YELLOW   APPearance HAZY (A) CLEAR   Specific Gravity, Urine 1.028 1.005 - 1.030   pH 6.0 5.0 - 8.0   Glucose, UA NEGATIVE NEGATIVE mg/dL   Hgb urine dipstick NEGATIVE NEGATIVE   Bilirubin Urine NEGATIVE NEGATIVE   Ketones, ur NEGATIVE NEGATIVE mg/dL   Protein, ur 30 (A) NEGATIVE mg/dL   Nitrite NEGATIVE NEGATIVE   Leukocytes,Ua TRACE (A) NEGATIVE   RBC / HPF 0-5 0 - 5 RBC/hpf   WBC, UA 0-5 0 - 5 WBC/hpf   Bacteria, UA NONE SEEN NONE SEEN   Squamous Epithelial / LPF 0-5 0 - 5   Mucus PRESENT   CBC  Result Value Ref Range   WBC  7.1 4.0 - 10.5 K/uL   RBC 5.05 3.87 - 5.11 MIL/uL   Hemoglobin 13.4 12.0 - 15.0 g/dL   HCT 10.2 58.5 - 27.7 %   MCV 80.0 80.0 - 100.0 fL   MCH 26.5 26.0 - 34.0 pg   MCHC 33.2 30.0 - 36.0 g/dL   RDW 82.4 23.5 - 36.1 %   Platelets 204 150 - 400 K/uL   nRBC 0.0 0.0 - 0.2 %  Comprehensive metabolic panel  Result Value Ref Range   Sodium 136 135 - 145 mmol/L   Potassium 2.9 (L) 3.5 - 5.1 mmol/L   Chloride 107 98 - 111 mmol/L   CO2 21 (L) 22 - 32 mmol/L   Glucose, Bld 94 70 - 99 mg/dL   BUN <5 (L) 6 - 20 mg/dL   Creatinine, Ser 4.43 (L) 0.44 - 1.00 mg/dL   Calcium 8.6 (L) 8.9 - 10.3 mg/dL   Total Protein 7.3 6.5 - 8.1 g/dL   Albumin 3.1 (L) 3.5 - 5.0 g/dL   AST 21 15 - 41 U/L   ALT 17 0 - 44 U/L   Alkaline Phosphatase 151 (H) 38 - 126 U/L   Total Bilirubin 0.5 0.3 - 1.2 mg/dL   GFR calc non Af Amer >60 >60 mL/min   GFR calc Af Amer >60 >60 mL/min   Anion gap 8 5 - 15  Magnesium  Result Value Ref Range   Magnesium 1.9 1.7 - 2.4 mg/dL  Urine Drug Screen, Qualitative ([facility] only)  Result Value Ref Range   Tricyclic, Ur Screen NONE DETECTED NONE DETECTED   Amphetamines, Ur Screen NONE DETECTED NONE DETECTED   MDMA (Ecstasy)Ur Screen NONE DETECTED NONE DETECTED   Cocaine Metabolite,Ur [place] NONE DETECTED NONE DETECTED   Opiate, Ur Screen NONE DETECTED NONE DETECTED   Phencyclidine (PCP) Ur S NONE DETECTED NONE DETECTED   Cannabinoid 50 Ng, Ur [place] NONE DETECTED NONE DETECTED   Barbiturates, Ur Screen NONE DETECTED NONE DETECTED   Benzodiazepine, Ur Scrn NONE DETECTED NONE DETECTED   Methadone Scn, Ur NONE DETECTED NONE DETECTED   Treatments: IV hydration and analgesia: acetaminophen and one dose stadol  Discharge Exam: Blood pressure (!) 102/48, pulse 73, temperature 97.8 F (36.6 C), temperature source Oral, resp. rate 16, height 5\' 1"  (1.549 m), weight 81.6 kg, last menstrual period 09/10/2018, unknown if currently breastfeeding. General appearance: alert, cooperative and no  distress Head: Normocephalic, without obvious abnormality, atraumatic Neck: no adenopathy and thyroid not enlarged, symmetric, no tenderness/mass/nodules Resp: clear to auscultation bilaterally Cardio: regular rate and rhythm, S1, S2 normal, no murmur, click, rub or gallop GI: gravid, ND, no rebound, no tenderness throughout Extremities: extremities normal, atraumatic, no cyanosis or edema Skin: Skin color, texture, turgor normal. No rashes or lesions  A NST procedure was performed with FHR monitoring and a normal baseline established, appropriate time of 20-40 minutes of evaluation, and accels >2 seen w 15x15 characteristics.  Results show a REACTIVE NST.   Disposition: Discharge disposition: 01-Home or Self Care      Discharge Instructions    Call MD for:   Complete by: As directed    Worsening contractions or pain; leakage of fluid; bleeding.   Diet general   Complete by: As directed    Increase activity slowly   Complete by: As directed      Allergies as of 05/01/2019   No Known Allergies     Medication List    STOP taking these medications   Butalbital-APAP-Caffeine 50-325-40 MG capsule   famotidine 20 MG tablet Commonly known as: PEPCID     TAKE these medications   potassium chloride SA 20 MEQ tablet Commonly known as: KLOR-CON Take 1 tablet (20 mEq total) by mouth daily.      [place] Follow up in 1 day(s).   Why: Ultrasound and follow up Contact information: [address] [place] [address] [phone]           Signed: Hoyt Koch 05/01/2019, 9:29 AM

## 2019-05-01 NOTE — Progress Notes (Signed)
Jessica Pea, MD aware of lab results. Verbal order for intermittent monitoring.Plan is to continue IV hydration, assess pain, and keep pt over night and  re-evaluate in the morning.

## 2019-05-02 ENCOUNTER — Telehealth: Payer: Self-pay | Admitting: Obstetrics & Gynecology

## 2019-05-02 NOTE — Telephone Encounter (Signed)
-----   Message from Nadara Mustard, MD sent at 05/01/2019  9:22 AM EDT ----- Regarding: [place] appt Mon or Tues Plz call and schedule ROB/f/u w [place] Appt for 3/29 or 3/30, and OB provider can see her Was seen on L&D 3/27-28

## 2019-05-02 NOTE — Telephone Encounter (Signed)
Called and left voicemail for patient to call back to confirm °

## 2019-05-03 ENCOUNTER — Telehealth: Payer: Self-pay

## 2019-05-03 ENCOUNTER — Encounter: Payer: Medicaid Other | Admitting: Obstetrics & Gynecology

## 2019-05-03 ENCOUNTER — Other Ambulatory Visit: Payer: Medicaid Other

## 2019-05-03 NOTE — Telephone Encounter (Signed)
Pt called; had pain; seen at [facility]; wants info on what's going on now.  [phone]  Pt is concerned b/c [facility] is giving her medication to make her sleep; she's still in pain; is dilated to 3cm; they run NSTs for only .  Wants to be back here with [place].  Adv to talk c pt liason person and see if they can help her or ask for a tx.  States [facility] doesn't have her records.  Adv they have Epic they should be able to see our records on her.  She said they were going to try to find it.  Pt has small children at home and hardly anyone to stay with them.  [facility] won't induce her.  Adv the longer she can stay preg the better. Adv her to tell them she needs them to listen to what she has to say.

## 2019-05-03 NOTE — Telephone Encounter (Signed)
Patient is currently an in patient at [facility] since Sunday. Patient is not able to come in today for ultrasound.

## 2019-05-03 NOTE — Telephone Encounter (Signed)
Patient is schedule for 05/13/19 with SDJ . Patient will call if anything changes

## 2019-05-05 ENCOUNTER — Observation Stay
Admission: EM | Admit: 2019-05-05 | Discharge: 2019-05-05 | Disposition: A | Discharge status: Home / Self Care | Payer: Medicaid Other | Attending: Obstetrics and Gynecology | Admitting: Obstetrics and Gynecology

## 2019-05-05 ENCOUNTER — Other Ambulatory Visit: Payer: Self-pay

## 2019-05-05 ENCOUNTER — Telehealth: Payer: Self-pay

## 2019-05-05 ENCOUNTER — Encounter: Payer: Self-pay | Admitting: Obstetrics and Gynecology

## 2019-05-05 DIAGNOSIS — Z803 Family history of malignant neoplasm of breast: Secondary | ICD-10-CM | POA: Insufficient documentation

## 2019-05-05 DIAGNOSIS — Z9049 Acquired absence of other specified parts of digestive tract: Secondary | ICD-10-CM | POA: Diagnosis not present

## 2019-05-05 DIAGNOSIS — Z8249 Family history of ischemic heart disease and other diseases of the circulatory system: Secondary | ICD-10-CM | POA: Insufficient documentation

## 2019-05-05 DIAGNOSIS — Z833 Family history of diabetes mellitus: Secondary | ICD-10-CM | POA: Diagnosis not present

## 2019-05-05 DIAGNOSIS — M549 Dorsalgia, unspecified: Secondary | ICD-10-CM | POA: Diagnosis present

## 2019-05-05 DIAGNOSIS — Z3A33 33 weeks gestation of pregnancy: Secondary | ICD-10-CM | POA: Diagnosis not present

## 2019-05-05 DIAGNOSIS — Z8673 Personal history of transient ischemic attack (TIA), and cerebral infarction without residual deficits: Secondary | ICD-10-CM | POA: Insufficient documentation

## 2019-05-05 DIAGNOSIS — O0990 Supervision of high risk pregnancy, unspecified, unspecified trimester: Secondary | ICD-10-CM

## 2019-05-05 DIAGNOSIS — O99891 Other specified diseases and conditions complicating pregnancy: Secondary | ICD-10-CM

## 2019-05-05 DIAGNOSIS — Z8489 Family history of other specified conditions: Secondary | ICD-10-CM | POA: Insufficient documentation

## 2019-05-05 DIAGNOSIS — O0991 Supervision of high risk pregnancy, unspecified, first trimester: Secondary | ICD-10-CM

## 2019-05-05 DIAGNOSIS — O34219 Maternal care for unspecified type scar from previous cesarean delivery: Secondary | ICD-10-CM | POA: Diagnosis present

## 2019-05-05 DIAGNOSIS — O26893 Other specified pregnancy related conditions, third trimester: Secondary | ICD-10-CM | POA: Diagnosis not present

## 2019-05-05 NOTE — Telephone Encounter (Signed)
Pt called after hours nurse line 05-04-19 reports that she fell last Saturday and was hospitalized  Still hurting when they discharged her Sunday. No bleeding or leaking fluids, I returned call NO answer or Voicemail set up.

## 2019-05-05 NOTE — OB Triage Note (Signed)
Patient given discharge instructions and verbalized understanding. Patient discharged ambulatory in stable condition.

## 2019-05-05 NOTE — Final Progress Note (Signed)
Physician Final Progress Note  Patient ID: Jessica Randall MRN: [id number] DOB/AGE: 1995-05-14 23 y.o.  Admit date: 05/05/2019 Admitting provider: Will Bonnet, MD Discharge date: 05/05/2019   Admission Diagnoses:  1) Back pain affecting pregnancy in third trimster 2) Supervision high risk pregnancy, antepartum, third trimester 3) preterm labor in third trimester without delivery 4) history of cesarean delivery complicating pregnancy  Discharge Diagnoses:  Active Problems:   History of cesarean section complicating pregnancy   Supervision of other normal pregnancy, antepartum   Back pain affecting pregnancy in third trimester   Preterm labor in third trimester without delivery    History of Present Illness: The patient is a 24 y.o. female [phone] at [redacted]w[redacted]d who presents for back pain that has been present since last Saturday. She was bowling last Saturday (5 days ago) and chased her son and fell and landed on her left side.  Since that time she has had constant pain in her bilateral lower back/upper gluteal area that radiates around to her inguinal folds and vulva.  The pain does not stop and is moderate to severe.  Nothing makes the pain better or worse. There are no associated symptoms.  She had this same back pain while she was an inpatient at [facility]. They tried a heating pad, muscle relaxing medication, narcotic pain medication.  None of these alleviated her pain.  At the advice of [facility] she has tried soaking in Epsom salts and a heating pad at home, all with no relief of her pain. She states that she has no contractions and that this pain is separate from her contraction-like pain she had at [facility] that caused her to dilate to 3cm.  She denies vaginal bleeding and leaking fluid. She notes fetal movement.     Past Medical History:  Diagnosis Date  . Anemia   . Anxiety and depression    history of SI  . Cholelithiasis affecting pregnancy in third trimester, antepartum 05/12/2017  . History  of drug use    MJ  . History of gonorrhea    with G2  . Pain in symphysis pubis during pregnancy 01/30/2017  . Stroke-like symptoms 03/28/2015  . TIA (transient ischemic attack) 03/28/2015    Past Surgical History:  Procedure Laterality Date  . CESAREAN SECTION N/A 06/18/2017   Procedure: CESAREAN SECTION;  Surgeon: Homero Fellers, MD;  Location: [facility] ORS;  Service: Obstetrics;  Laterality: N/A;  . CHOLECYSTECTOMY Right 05/15/2017    No current facility-administered medications on file prior to encounter.   Current Outpatient Medications on File Prior to Encounter  Medication Sig Dispense Refill  . potassium chloride SA (KLOR-CON) 20 MEQ tablet Take 1 tablet (20 mEq total) by mouth daily. (Patient not taking: Reported on 04/30/2019) 5 tablet 0    No Known Allergies  Social History   Socioeconomic History  . Marital status: Single    Spouse name: Not on file  . Number of children: 3  . Years of education: Not on file  . Highest education level: Not on file  Occupational History  . Occupation: food Location manager: [employer]  Tobacco Use  . Smoking status: Never Smoker  . Smokeless tobacco: Never Used  Substance and Sexual Activity  . Alcohol use: No    Alcohol/week: 0.0 standard drinks    Comment: occ.   . Drug use: No    Comment: h/o THC use  . Sexual activity: Yes    Birth control/protection: None    Comment: undecided  Other Topics Concern  . Not on file  Social History Narrative  . Not on file   Social Determinants of Health   Financial Resource Strain:   . Difficulty of Paying Living Expenses:   Food Insecurity:   . Worried About Programme researcher, broadcasting/film/video in the Last Year:   . Barista in the Last Year:   Transportation Needs:   . Freight forwarder (Medical):   Marland Kitchen Lack of Transportation (Non-Medical):   Physical Activity:   . Days of Exercise per Week:   . Minutes of Exercise per Session:   Stress:   . Feeling of Stress :   Social  Connections:   . Frequency of Communication with Friends and Family:   . Frequency of Social Gatherings with Friends and Family:   . Attends Religious Services:   . Active Member of Clubs or Organizations:   . Attends Banker Meetings:   Marland Kitchen Marital Status:   Intimate Partner Violence:   . Fear of Current or Ex-Partner:   . Emotionally Abused:   Marland Kitchen Physically Abused:   . Sexually Abused:     Family History  Problem Relation Age of Onset  . Lupus Mother   . Hypertension Mother   . Hyperthyroidism Mother   . Hyperlipidemia Mother   . Hypertension Maternal Grandmother   . Diabetes Maternal Grandmother   . Transient ischemic attack Maternal Grandmother   . Hyperlipidemia Maternal Grandfather   . Breast cancer Paternal Grandmother   . Transient ischemic attack Paternal Grandmother      Review of Systems  Constitutional: Negative.   HENT: Negative.   Eyes: Negative.   Respiratory: Negative.   Cardiovascular: Negative.   Gastrointestinal: Negative.   Genitourinary: Negative.   Musculoskeletal: Positive for back pain and falls. Negative for joint pain, myalgias and neck pain.  Skin: Negative.   Neurological: Negative.   Psychiatric/Behavioral: Negative.      Physical Exam: BP 114/68   Pulse 85   Temp 97.9 F (36.6 C) (Oral)   Resp 17   LMP 09/10/2018   Physical Exam Constitutional:      General: She is not in acute distress.    Appearance: Normal appearance. She is well-developed.  HENT:     Head: Normocephalic and atraumatic.  Eyes:     General: No scleral icterus.    Conjunctiva/sclera: Conjunctivae normal.  Cardiovascular:     Rate and Rhythm: Normal rate and regular rhythm.     Heart sounds: No murmur. No friction rub. No gallop.   Pulmonary:     Effort: Pulmonary effort is normal. No respiratory distress.     Breath sounds: Normal breath sounds. No wheezing or rales.  Abdominal:     General: Bowel sounds are normal. There is no distension.      Palpations: Abdomen is soft. There is no mass.     Tenderness: There is no abdominal tenderness. There is no guarding or rebound.  Musculoskeletal:        General: Normal range of motion.     Cervical back: Normal range of motion and neck supple.       Back:  Neurological:     General: No focal deficit present.     Mental Status: She is alert and oriented to person, place, and time.     Cranial Nerves: No cranial nerve deficit.  Skin:    General: Skin is warm and dry.     Findings: No erythema.  Psychiatric:  Mood and Affect: Mood normal.        Behavior: Behavior normal.        Judgment: Judgment normal.     Consults: None  Significant Findings/ Diagnostic Studies: none  Procedures:  NST: Baseline FHR: 140 beats/min Variability: moderate Accelerations: present Decelerations: absent Tocometry: quiet  Interpretation:  INDICATIONS: rule out uterine contractions RESULTS:  A NST procedure was performed with FHR monitoring and a normal baseline established, appropriate time of 20-40 minutes of evaluation, and accels >2 seen w 15x15 characteristics.  Results show a REACTIVE NST.    Hospital Course: The patient was admitted to Labor and Delivery Triage for observation. She had normal vital signs. The fetal tracing was reactive.  She had no concerning exam findings.  We discussed multiple possible ways of improving her back pain. We discussed physical therapy, biofreeze, use a pregnancy band to support her back, using ice instead of heat, as well as no-impact stretches to improve her back motility.  Ultimately, she will have to try to figure out what works for her.   Of note: [facility] had some concerns regarding her family history of VTE. She has a history of TIA symptoms and no confirmed diagnosis of a stroke, though that is reported in parts of her chart.  She has no functional defects.  In 2017 she was tested for APLAS, prothrombin gene mutation, factor 5 leiden ([facility] recommended  this testing, but it has been done), protein S, protein C abnormalities, antithrombin III.  All of these tests were normal.  She was reassured regarding her fetal status.  Decision made not to check her cervix given her lack of contractions and symptoms of contractions. I even discussed that if she said she was having contractions, I would check her cervix.  However, without contractions I would prefer not to check her cervix so as to reduce risk of introducing infection and possibly increasing risk of contractions. She voiced understanding.    Discharge Condition: stable  Disposition: Discharge disposition: 01-Home or Self Care       Diet: Regular diet  Discharge Activity: Activity as tolerated   Allergies as of 05/05/2019   No Known Allergies     Medication List    STOP taking these medications   potassium chloride SA 20 MEQ tablet Commonly known as: KLOR-CON      Follow-up Information    [facility]. Schedule an appointment as soon as possible for a visit in 1 week(s).   Specialty: Obstetrics and Gynecology Why: Follow up from hospitalization from [facility] information: [address] [place] [address] [phone]          Total time spent taking care of this patient: 40 minutes  Signed: Thomasene Mohair, MD  05/05/2019, 9:31 PM

## 2019-05-05 NOTE — Discharge Instructions (Signed)
May try: Biofreeze Pregnancy band Ice Stretching exercises

## 2019-05-05 NOTE — Discharge Summary (Signed)
See final progress note. 

## 2019-05-05 NOTE — OB Triage Note (Signed)
Patient arrived with back and pain in the lower abdominal area. It is constant. Patient does not think it is contractions. Denies leaking of fluid, any vaginal discharge or foul smelling discharge, denies bleeding, and fetal movement present.

## 2019-05-09 ENCOUNTER — Ambulatory Visit (INDEPENDENT_AMBULATORY_CARE_PROVIDER_SITE_OTHER): Payer: Medicaid Other

## 2019-05-09 ENCOUNTER — Other Ambulatory Visit: Payer: Self-pay

## 2019-05-09 ENCOUNTER — Encounter: Payer: Self-pay | Admitting: Obstetrics and Gynecology

## 2019-05-09 ENCOUNTER — Ambulatory Visit (INDEPENDENT_AMBULATORY_CARE_PROVIDER_SITE_OTHER): Payer: Medicaid Other | Admitting: Obstetrics and Gynecology

## 2019-05-09 VITALS — BP 90/50 | Wt 179.0 lb

## 2019-05-09 DIAGNOSIS — Z3A34 34 weeks gestation of pregnancy: Secondary | ICD-10-CM

## 2019-05-09 DIAGNOSIS — Z3480 Encounter for supervision of other normal pregnancy, unspecified trimester: Secondary | ICD-10-CM

## 2019-05-09 DIAGNOSIS — O368130 Decreased fetal movements, third trimester, not applicable or unspecified: Secondary | ICD-10-CM

## 2019-05-09 DIAGNOSIS — R109 Unspecified abdominal pain: Secondary | ICD-10-CM

## 2019-05-09 DIAGNOSIS — O34219 Maternal care for unspecified type scar from previous cesarean delivery: Secondary | ICD-10-CM

## 2019-05-09 LAB — POCT URINALYSIS DIPSTICK OB
Glucose, UA: NEGATIVE
POC,PROTEIN,UA: NEGATIVE

## 2019-05-09 LAB — FETAL NONSTRESS TEST

## 2019-05-09 NOTE — Progress Notes (Signed)
Abdominal pain- contractions all night- no vaginal bleeding/leaking

## 2019-05-09 NOTE — Progress Notes (Signed)
Routine Prenatal Care Visit  Subjective  Jessica Randall is a 24 y.o. [id number] at [redacted]w[redacted]d being seen today for ongoing prenatal care.  She is currently monitored for the following issues for this low-risk pregnancy and has History of cesarean section complicating pregnancy; Supervision of other normal pregnancy, antepartum; Pelvic pain affecting pregnancy in second trimester, antepartum; Indication for care in labor and delivery, antepartum; Pelvic pain affecting pregnancy in third trimester, antepartum; Back pain affecting pregnancy in third trimester; and Preterm labor in third trimester without delivery on their problem list.  ----------------------------------------------------------------------------------- Patient reports that she was admitted for 4 days last week at [facility].  She reports that [place] and then had pain and cramping in her left side.  She reports that her cervix was closed which she may try to change to 3 cm during her hospital stay.  She reports that she was given betamethasone and a lot of IV fluids.  She was then discharged home without delivering.  She reports that last night she had contractions which lasted for several hours and now have dissipated.  She is not feeling them regularly.  She is also reporting decreased fetal movement.  She reports that she only felt her baby move once yesterday. Contractions: Irritability. Vag. Bleeding: None.  Movement: (!) Decreased. Denies leaking of fluid.  ----------------------------------------------------------------------------------- The following portions of the patient's history were reviewed and updated as appropriate: allergies, current medications, past family history, past medical history, past social history, past surgical history and problem list. Problem list updated.   Objective  Blood pressure (!) 90/50, weight 179 lb (81.2 kg), last menstrual period 09/10/2018, unknown if currently  breastfeeding. Pregravid weight 189 lb (85.7 kg) Total Weight Gain -10 lb (-4.536 kg) Urinalysis:      Fetal Status: Fetal Heart Rate (bpm): 140 Fundal Height: 34 cm Movement: (!) Decreased     General:  Alert, oriented and cooperative. Patient is in no acute distress.  Skin: Skin is warm and dry. No rash noted.   Cardiovascular: Normal heart rate noted  Respiratory: Normal respiratory effort, no problems with respiration noted  Abdomen: Soft, gravid, appropriate for gestational age. Pain/Pressure: Absent     Pelvic:  Cervical exam deferred        Extremities: Normal range of motion.     Mental Status: Normal mood and affect. Normal behavior. Normal judgment and thought content.     Assessment   24 y.o. [id number] at [redacted]w[redacted]d by  06/17/2019, by Last Menstrual Period presenting for routine prenatal visit  Plan   pregnancy4 Problems (from 09/10/18 to present)    Problem Noted Resolved   Preterm labor in third trimester without delivery 05/05/2019 by Conard Novak, MD No   Overview Signed 05/05/2019  9:30 PM by Conard Novak, MD    Admitted to [facility] on 3/28-3/31/21. S/p bmtz 3/28-3/29 S/p iron infusion at [facility] Dilated to 3cm at [facility], then stopped.      Pelvic pain affecting pregnancy in third trimester, antepartum 05/01/2019 by Nadara Mustard, MD No   Supervision of other normal pregnancy, antepartum 11/10/2018 by Oswaldo Conroy, CNM No   Overview Addendum 04/30/2019  3:41 PM by [name], CNM    [facility] Prenatal Labs  Dating  LMP=10 wk [place] Blood type: A/Positive/-- (10/19 1136)   Genetic Screen declines Antibody:Negative (10/19 1136)  Anatomic [place] normal Rubella: 2.66 (10/19 1136) Varicella: Immune  GTT Early: 96 Third trimester:  RPR: Non Reactive (  10/19 1136)   Rhogam N/A HBsAg: Negative (10/19 1136)   TDaP vaccine    04/29/19                   Flu Shot: HIV: Non Reactive (10/19 1136)   Baby Food                                GBS:   Contraception BTL  (consent signed 3/26 Pap: 11/09/2018, NILM  CBB     CS/VBAC    Support Person                  NST: 145 bpm baseline, moderate variability, 15x15 accelerations, no decelerations.  BPP 8/8  Discussed how to perform fetal kick counts for movement concerns and when to come to the hospital.   Gestational age appropriate obstetric precautions including but not limited to vaginal bleeding, contractions, leaking of fluid and fetal movement were reviewed in detail with the patient.    Return in about 1 week (around 05/16/2019) for ROB in person.  Homero Fellers MD [facility], [facility] 05/09/2019, 2:25 PM

## 2019-05-09 NOTE — Patient Instructions (Addendum)
Fetal Movement Counts Patient Name: ________________________________________________ Patient Due Date: ____________________ What is a fetal movement count?  A fetal movement count is the number of times that you feel your baby move during a certain amount of time. This may also be called a fetal kick count. A fetal movement count is recommended for every pregnant woman. You may be asked to start counting fetal movements as early as week 28 of your pregnancy. Pay attention to when your baby is most active. You may notice your baby's sleep and wake cycles. You may also notice things that make your baby move more. You should do a fetal movement count:  When your baby is normally most active.  At the same time each day. A good time to count movements is while you are resting, after having something to eat and drink. How do I count fetal movements? 1. Find a quiet, comfortable area. Sit, or lie down on your side. 2. Write down the date, the start time and stop time, and the number of movements that you felt between those two times. Take this information with you to your health care visits. 3. Write down your start time when you feel the first movement. 4. Count kicks, flutters, swishes, rolls, and jabs. You should feel at least 10 movements. 5. You may stop counting after you have felt 10 movements, or if you have been counting for 2 hours. Write down the stop time. 6. If you do not feel 10 movements in 2 hours, contact your health care provider for further instructions. Your health care provider may want to do additional tests to assess your baby's well-being. Contact a health care provider if:  You feel fewer than 10 movements in 2 hours.  Your baby is not moving like he or she usually does. Date: ____________ Start time: ____________ Stop time: ____________ Movements: ____________ Date: ____________ Start time: ____________ Stop time: ____________ Movements: ____________ Date: ____________  Start time: ____________ Stop time: ____________ Movements: ____________ Date: ____________ Start time: ____________ Stop time: ____________ Movements: ____________ Date: ____________ Start time: ____________ Stop time: ____________ Movements: ____________ Date: ____________ Start time: ____________ Stop time: ____________ Movements: ____________ Date: ____________ Start time: ____________ Stop time: ____________ Movements: ____________ Date: ____________ Start time: ____________ Stop time: ____________ Movements: ____________ Date: ____________ Start time: ____________ Stop time: ____________ Movements: ____________ This information is not intended to replace advice given to you by your health care provider. Make sure you discuss any questions you have with your health care provider. Document Revised: 09/09/2018 Document Reviewed: 09/09/2018 Elsevier Patient Education  2020 [employer]   Third Trimester of Pregnancy The third trimester is from week 28 through week 40 (months 7 through 9). The third trimester is a time when the unborn baby (fetus) is growing rapidly. At the end of the ninth month, the fetus is about 20 inches in length and weighs 6-10 pounds. Body changes during your third trimester Your body will continue to go through many changes during pregnancy. The changes vary from woman to woman. During the third trimester:  Your weight will continue to increase. You can expect to gain 25-35 pounds (11-16 kg) by the end of the pregnancy.  You may begin to get stretch marks on your hips, abdomen, and breasts.  You may urinate more often because the fetus is moving lower into your pelvis and pressing on your bladder.  You may develop or continue to have heartburn. This is caused by increased hormones that slow down muscles in the  digestive tract.  You may develop or continue to have constipation because increased hormones slow digestion and cause the muscles that push waste through  your intestines to relax.  You may develop hemorrhoids. These are swollen veins (varicose veins) in the rectum that can itch or be painful.  You may develop swollen, bulging veins (varicose veins) in your legs.  You may have increased body aches in the pelvis, back, or thighs. This is due to weight gain and increased hormones that are relaxing your joints.  You may have changes in your hair. These can include thickening of your hair, rapid growth, and changes in texture. Some women also have hair loss during or after pregnancy, or hair that feels dry or thin. Your hair will most likely return to normal after your baby is born.  Your breasts will continue to grow and they will continue to become tender. A yellow fluid (colostrum) may leak from your breasts. This is the first milk you are producing for your baby.  Your belly button may stick out.  You may notice more swelling in your hands, face, or ankles.  You may have increased tingling or numbness in your hands, arms, and legs. The skin on your belly may also feel numb.  You may feel short of breath because of your expanding uterus.  You may have more problems sleeping. This can be caused by the size of your belly, increased need to urinate, and an increase in your body's metabolism.  You may notice the fetus "dropping," or moving lower in your abdomen (lightening).  You may have increased vaginal discharge.  You may notice your joints feel loose and you may have pain around your pelvic bone. What to expect at prenatal visits You will have prenatal exams every 2 weeks until week 36. Then you will have weekly prenatal exams. During a routine prenatal visit:  You will be weighed to make sure you and the baby are growing normally.  Your blood pressure will be taken.  Your abdomen will be measured to track your baby's growth.  The fetal heartbeat will be listened to.  Any test results from the previous visit will be  discussed.  You may have a cervical check near your due date to see if your cervix has softened or thinned (effaced).  You will be tested for Group B streptococcus. This happens between 35 and 37 weeks. Your health care provider may ask you:  What your birth plan is.  How you are feeling.  If you are feeling the baby move.  If you have had any abnormal symptoms, such as leaking fluid, bleeding, severe headaches, or abdominal cramping.  If you are using any tobacco products, including cigarettes, chewing tobacco, and electronic cigarettes.  If you have any questions. Other tests or screenings that may be performed during your third trimester include:  Blood tests that check for low iron levels (anemia).  Fetal testing to check the health, activity level, and growth of the fetus. Testing is done if you have certain medical conditions or if there are problems during the pregnancy.  Nonstress test (NST). This test checks the health of your baby to make sure there are no signs of problems, such as the baby not getting enough oxygen. During this test, a belt is placed around your belly. The baby is made to move, and its heart rate is monitored during movement. What is false labor? False labor is a condition in which you feel small, irregular tightenings  of the muscles in the womb (contractions) that usually go away with rest, changing position, or drinking water. These are called Braxton Hicks contractions. Contractions may last for hours, days, or even weeks before true labor sets in. If contractions come at regular intervals, become more frequent, increase in intensity, or become painful, you should see your health care provider. What are the signs of labor?  Abdominal cramps.  Regular contractions that start at 10 minutes apart and become stronger and more frequent with time.  Contractions that start on the top of the uterus and spread down to the lower abdomen and back.  Increased  pelvic pressure and dull back pain.  A watery or bloody mucus discharge that comes from the vagina.  Leaking of amniotic fluid. This is also known as your "water breaking." It could be a slow trickle or a gush. Let your health care provider know if it has a color or strange odor. If you have any of these signs, call your health care provider right away, even if it is before your due date. Follow these instructions at home: Medicines  Follow your health care provider's instructions regarding medicine use. Specific medicines may be either safe or unsafe to take during pregnancy.  Take a prenatal vitamin that contains at least 600 micrograms (mcg) of folic acid.  If you develop constipation, try taking a stool softener if your health care provider approves. Eating and drinking   Eat a balanced diet that includes fresh fruits and vegetables, whole grains, good sources of protein such as meat, eggs, or tofu, and low-fat dairy. Your health care provider will help you determine the amount of weight gain that is right for you.  Avoid raw meat and uncooked cheese. These carry germs that can cause birth defects in the baby.  If you have low calcium intake from food, talk to your health care provider about whether you should take a daily calcium supplement.  Eat four or five small meals rather than three large meals a day.  Limit foods that are high in fat and processed sugars, such as fried and sweet foods.  To prevent constipation: ? Drink enough fluid to keep your urine clear or pale yellow. ? Eat foods that are high in fiber, such as fresh fruits and vegetables, whole grains, and beans. Activity  Exercise only as directed by your health care provider. Most women can continue their usual exercise routine during pregnancy. Try to exercise for 30 minutes at least 5 days a week. Stop exercising if you experience uterine contractions.  Avoid heavy lifting.  Do not exercise in extreme heat or  humidity, or at high altitudes.  Wear low-heel, comfortable shoes.  Practice good posture.  You may continue to have sex unless your health care provider tells you otherwise. Relieving pain and discomfort  Take frequent breaks and rest with your legs elevated if you have leg cramps or low back pain.  Take warm sitz baths to soothe any pain or discomfort caused by hemorrhoids. Use hemorrhoid cream if your health care provider approves.  Wear a good support bra to prevent discomfort from breast tenderness.  If you develop varicose veins: ? Wear support pantyhose or compression stockings as told by your healthcare provider. ? Elevate your feet for 15 minutes, 3-4 times a day. Prenatal care  Write down your questions. Take them to your prenatal visits.  Keep all your prenatal visits as told by your health care provider. This is important. Safety  Wear  your seat belt at all times when driving.  Make a list of emergency phone numbers, including numbers for family, friends, the hospital, and police and fire departments. General instructions  Avoid cat litter boxes and soil used by cats. These carry germs that can cause birth defects in the baby. If you have a cat, ask someone to clean the litter box for you.  Do not travel far distances unless it is absolutely necessary and only with the approval of your health care provider.  Do not use hot tubs, steam rooms, or saunas.  Do not drink alcohol.  Do not use any products that contain nicotine or tobacco, such as cigarettes and e-cigarettes. If you need help quitting, ask your health care provider.  Do not use any medicinal herbs or unprescribed drugs. These chemicals affect the formation and growth of the baby.  Do not douche or use tampons or scented sanitary pads.  Do not cross your legs for long periods of time.  To prepare for the arrival of your baby: ? Take prenatal classes to understand, practice, and ask questions about  labor and delivery. ? Make a trial run to the hospital. ? Visit the hospital and tour the maternity area. ? Arrange for maternity or paternity leave through employers. ? Arrange for family and friends to take care of pets while you are in the hospital. ? Purchase a rear-facing car seat and make sure you know how to install it in your car. ? Pack your hospital bag. ? Prepare the baby's nursery. Make sure to remove all pillows and stuffed animals from the baby's crib to prevent suffocation.  Visit your dentist if you have not gone during your pregnancy. Use a soft toothbrush to brush your teeth and be gentle when you floss. Contact a health care provider if:  You are unsure if you are in labor or if your water has broken.  You become dizzy.  You have mild pelvic cramps, pelvic pressure, or nagging pain in your abdominal area.  You have lower back pain.  You have persistent nausea, vomiting, or diarrhea.  You have an unusual or bad smelling vaginal discharge.  You have pain when you urinate. Get help right away if:  Your water breaks before 37 weeks.  You have regular contractions less than 5 minutes apart before 37 weeks.  You have a fever.  You are leaking fluid from your vagina.  You have spotting or bleeding from your vagina.  You have severe abdominal pain or cramping.  You have rapid weight loss or weight gain.  You have shortness of breath with chest pain.  You notice sudden or extreme swelling of your face, hands, ankles, feet, or legs.  Your baby makes fewer than 10 movements in 2 hours.  You have severe headaches that do not go away when you take medicine.  You have vision changes. Summary  The third trimester is from week 28 through week 40, months 7 through 9. The third trimester is a time when the unborn baby (fetus) is growing rapidly.  During the third trimester, your discomfort may increase as you and your baby continue to gain weight. You may have  abdominal, leg, and back pain, sleeping problems, and an increased need to urinate.  During the third trimester your breasts will keep growing and they will continue to become tender. A yellow fluid (colostrum) may leak from your breasts. This is the first milk you are producing for your baby.  False labor  is a condition in which you feel small, irregular tightenings of the muscles in the womb (contractions) that eventually go away. These are called Braxton Hicks contractions. Contractions may last for hours, days, or even weeks before true labor sets in.  Signs of labor can include: abdominal cramps; regular contractions that start at 10 minutes apart and become stronger and more frequent with time; watery or bloody mucus discharge that comes from the vagina; increased pelvic pressure and dull back pain; and leaking of amniotic fluid. This information is not intended to replace advice given to you by your health care provider. Make sure you discuss any questions you have with your health care provider. Document Revised: 05/13/2018 Document Reviewed: 02/26/2016 Elsevier Patient Education  2020 [employer].

## 2019-05-12 ENCOUNTER — Encounter: Payer: Self-pay | Admitting: Certified Nurse Midwife

## 2019-05-12 DIAGNOSIS — O99019 Anemia complicating pregnancy, unspecified trimester: Secondary | ICD-10-CM | POA: Insufficient documentation

## 2019-05-13 ENCOUNTER — Encounter: Payer: Medicaid Other | Admitting: Obstetrics and Gynecology

## 2019-05-21 IMAGING — CT CT HEAD W/O CM
3 of 8 series · 15 of 47 positions shown, 18 images · non-contrast
Comparison: Multiple studies 6767

CLINICAL DATA: Trauma to the head and face. Decreased hearing on
the left. First trimester pregnancy.

EXAM:
CT HEAD WITHOUT CONTRAST
CT MAXILLOFACIAL WITHOUT CONTRAST
TECHNIQUE: Multidetector CT imaging of the head and maxillofacial structures
were performed using the standard protocol without intravenous
contrast. Multiplanar CT image reconstructions of the maxillofacial
structures were also generated.

[Series 6: max soft · axial · 0.35mm/px · z∈[-126,+6]mm · 11 of 74 slices shown, 14 images]
[im 4/74  brain]
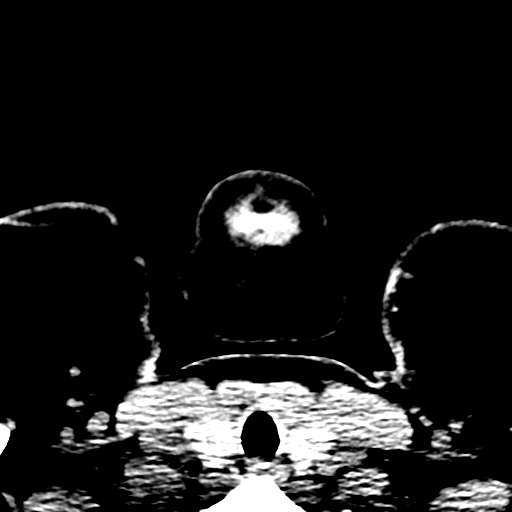
[im 4/74  bone]
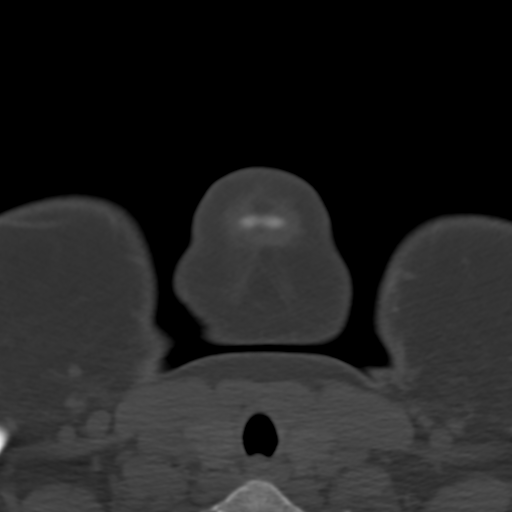
[im 11/74  brain]
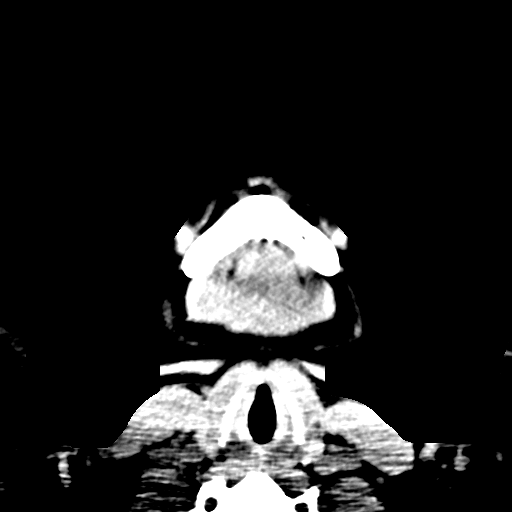
[im 18/74  brain]
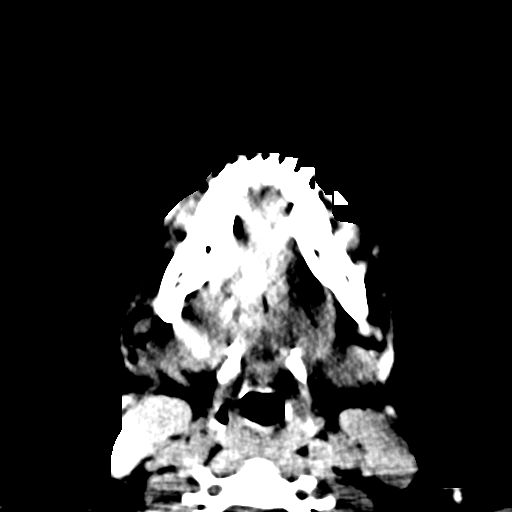
[im 25/74  brain]
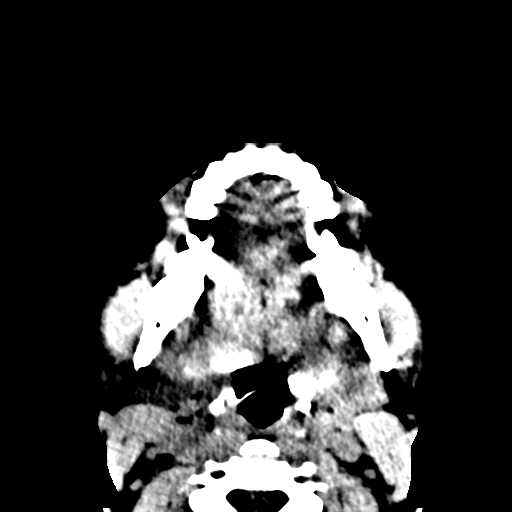
[im 32/74  brain]
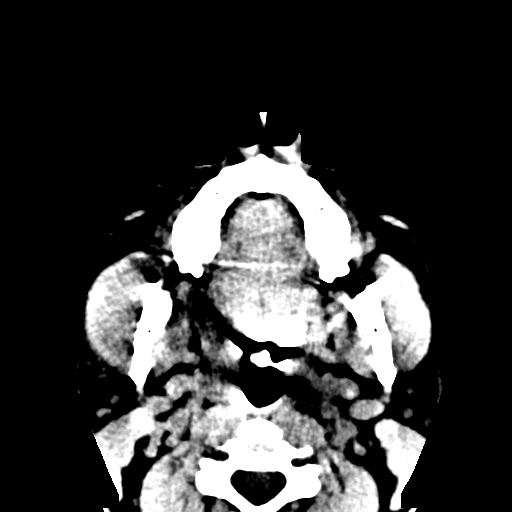
[im 32/74  bone]
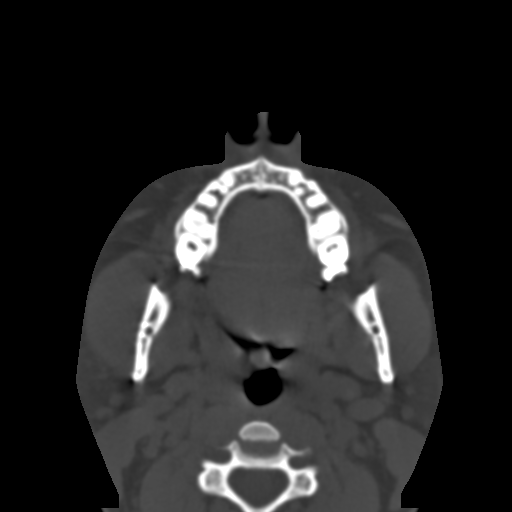
[im 39/74  brain]
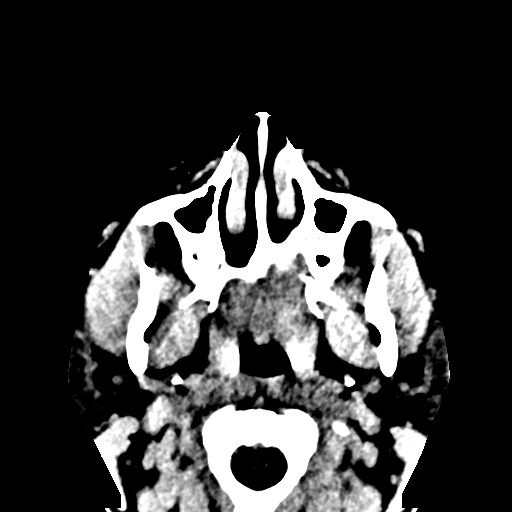
[im 42/74  brain]
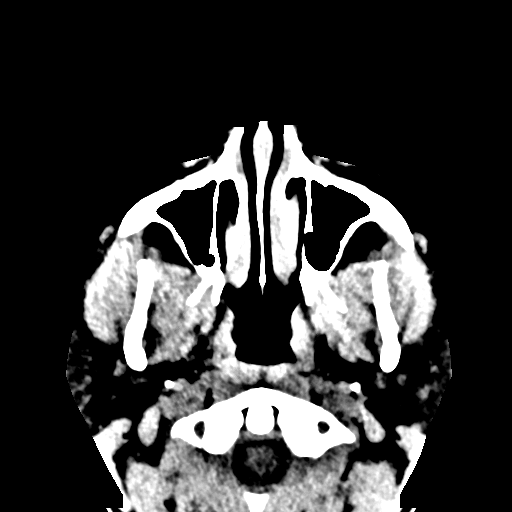
[im 49/74  brain]
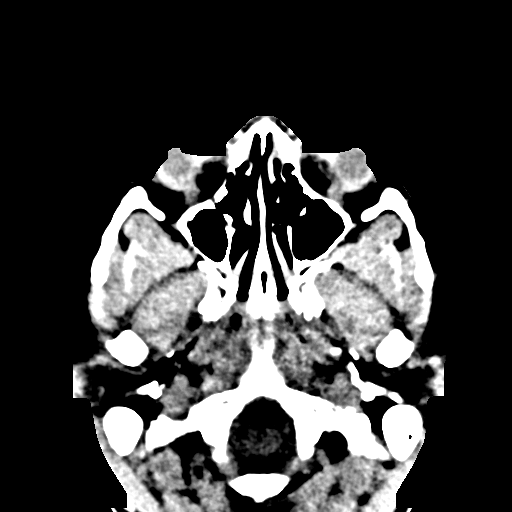
[im 56/74  brain]
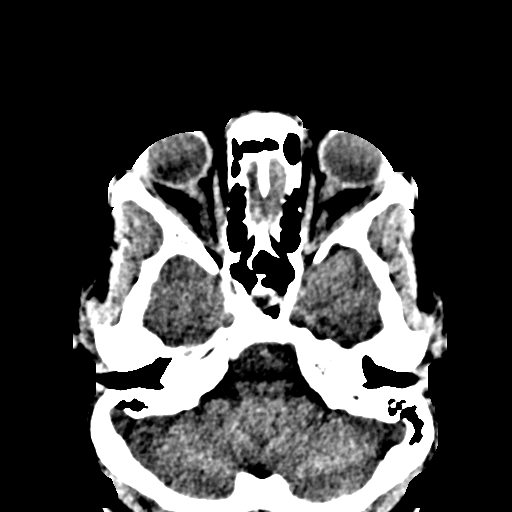
[im 56/74  bone]
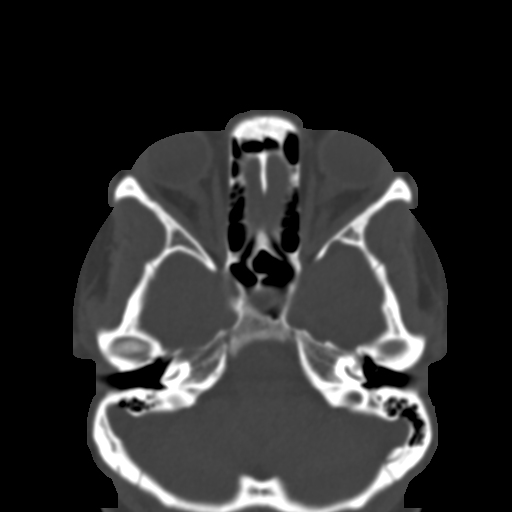
[im 63/74  brain]
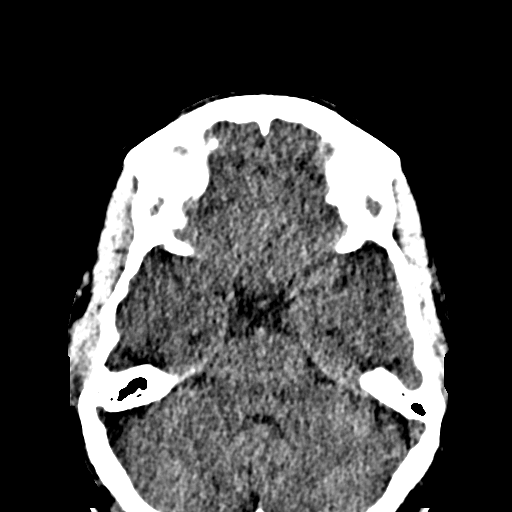
[im 70/74  brain]
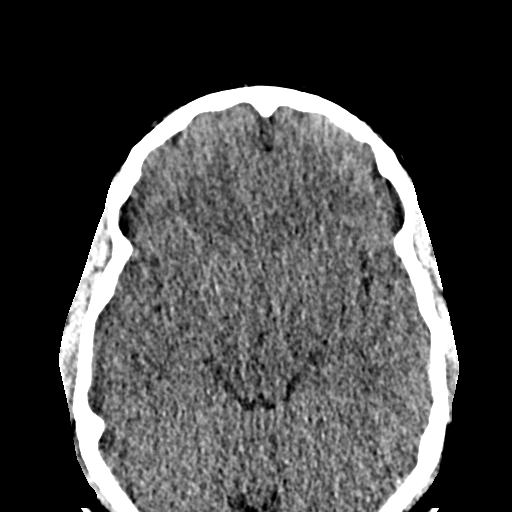

[Series 10: coronal soft · coronal · 0.31mm/px · 3 of 93 slices shown]
[im 23/93  brain]
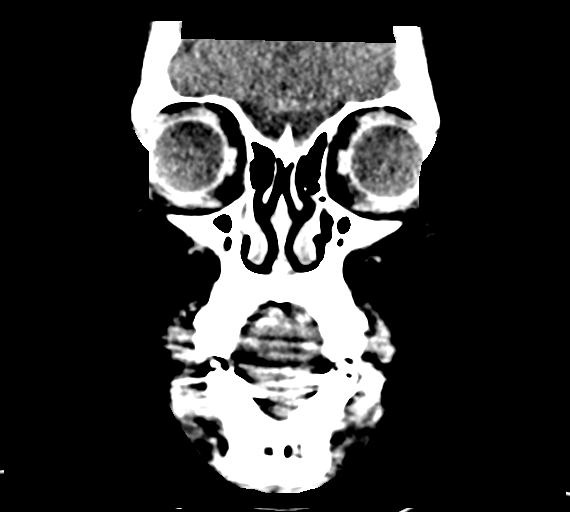
[im 46/93  brain]
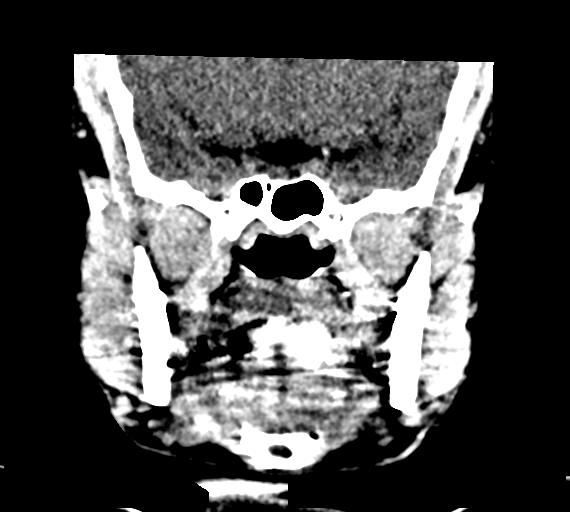
[im 69/93  brain]
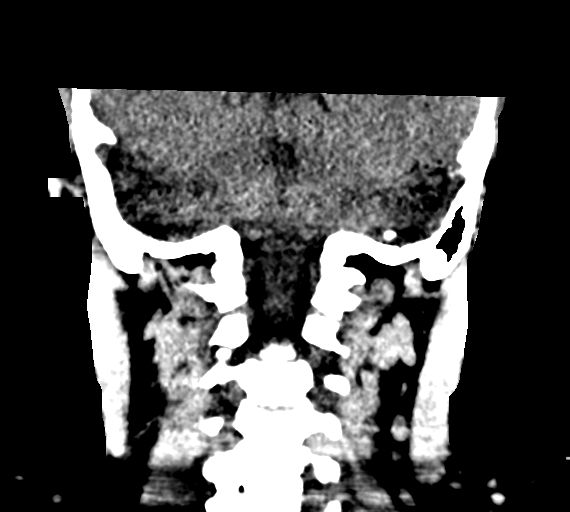

[Series 11: sagittal soft · sagittal · 0.34mm/px · 1 of 95 slices shown]
[im 48/95  brain]
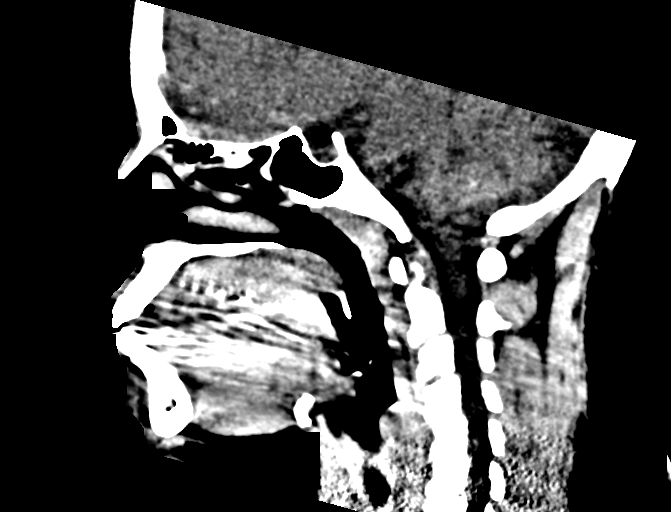

[15 of 47 positions shown; findings below may reference images not displayed]

FINDINGS: The patient was shielded for the examination.

CT HEAD FINDINGS

Brain: No evidence of malformation, atrophy, old or acute small or
large vessel infarction, mass lesion, hemorrhage, hydrocephalus or
extra-axial collection. No evidence of pituitary lesion.

Vascular: No vascular calcification.  No hyperdense vessels.

Skull: Normal.  No fracture or focal bone lesion.

Sinuses/Orbits: Visualized sinuses are clear. No fluid in the middle
ears or mastoids. Visualized orbits are normal.

Other: None significant

CT MAXILLOFACIAL FINDINGS

Osseous: No fracture.

Orbits: Normal. Globes, optic nerves, extra ocular muscles, orbital
fat and lacrimal glands appear normal.

Sinuses: No fluid or inflammatory change. Mastoids are clear. No
fluid in either middle ear. Inner ear structures appear normal.

Soft tissues: Normal
IMPRESSION: Head CT:  Normal.

Maxillofacial CT: Normal. No traumatic finding to explain the
clinical presentation.

## 2019-05-24 ENCOUNTER — Ambulatory Visit (INDEPENDENT_AMBULATORY_CARE_PROVIDER_SITE_OTHER): Payer: Medicaid Other | Admitting: Obstetrics and Gynecology

## 2019-05-24 ENCOUNTER — Other Ambulatory Visit: Payer: Self-pay

## 2019-05-24 ENCOUNTER — Other Ambulatory Visit (HOSPITAL_COMMUNITY)
Admission: RE | Admit: 2019-05-24 | Discharge: 2019-05-24 | Disposition: A | Discharge status: Home / Self Care | Payer: Medicaid Other | Source: Ambulatory Visit | Attending: Obstetrics and Gynecology | Admitting: Obstetrics and Gynecology

## 2019-05-24 ENCOUNTER — Encounter: Payer: Self-pay | Admitting: Obstetrics and Gynecology

## 2019-05-24 VITALS — BP 118/78 | Wt 179.0 lb

## 2019-05-24 DIAGNOSIS — Z3483 Encounter for supervision of other normal pregnancy, third trimester: Secondary | ICD-10-CM | POA: Insufficient documentation

## 2019-05-24 DIAGNOSIS — Z3A36 36 weeks gestation of pregnancy: Secondary | ICD-10-CM

## 2019-05-24 DIAGNOSIS — O34219 Maternal care for unspecified type scar from previous cesarean delivery: Secondary | ICD-10-CM

## 2019-05-24 DIAGNOSIS — R102 Pelvic and perineal pain: Secondary | ICD-10-CM

## 2019-05-24 DIAGNOSIS — O26893 Other specified pregnancy related conditions, third trimester: Secondary | ICD-10-CM

## 2019-05-24 DIAGNOSIS — O99013 Anemia complicating pregnancy, third trimester: Secondary | ICD-10-CM

## 2019-05-24 NOTE — Progress Notes (Signed)
Routine Prenatal Care Visit  Subjective  Jessica Randall is a 24 y.o. [phone] at [redacted]w[redacted]d being seen today for ongoing prenatal care.  She is currently monitored for the following issues for this high-risk pregnancy and has History of cesarean section complicating pregnancy; Supervision of other normal pregnancy, antepartum; Pelvic pain affecting pregnancy in second trimester, antepartum; Indication for care in labor and delivery, antepartum; Pelvic pain affecting pregnancy in third trimester, antepartum; Back pain affecting pregnancy in third trimester; Preterm labor in third trimester without delivery; and Anemia affecting pregnancy on their problem list.  ----------------------------------------------------------------------------------- Patient reports no complaints.   Contractions: Irritability. Vag. Bleeding: None.  Movement: Present. Leaking Fluid denies.  ----------------------------------------------------------------------------------- The following portions of the patient's history were reviewed and updated as appropriate: allergies, current medications, past family history, past medical history, past social history, past surgical history and problem list. Problem list updated.  Objective  Blood pressure 118/78, weight 179 lb (81.2 kg), last menstrual period 09/10/2018, unknown if currently breastfeeding. Pregravid weight 189 lb (85.7 kg) Total Weight Gain -10 lb (-4.536 kg) Urinalysis: Urine Protein    Urine Glucose    Fetal Status: Fetal Heart Rate (bpm): 130 Fundal Height: 36 cm Movement: Present     General:  Alert, oriented and cooperative. Patient is in no acute distress.  Skin: Skin is warm and dry. No rash noted.   Cardiovascular: Normal heart rate noted  Respiratory: Normal respiratory effort, no problems with respiration noted  Abdomen: Soft, gravid, appropriate for gestational age. Pain/Pressure: Present     Pelvic:  Cervical exam performed Dilation: 1.5 Effacement (%): 40  Station: -3  Extremities: Normal range of motion.  Edema: None  Mental Status: Normal mood and affect. Normal behavior. Normal judgment and thought content.   Assessment   24 y.o. [id number] at [redacted]w[redacted]d by  06/17/2019, by Last Menstrual Period presenting for routine prenatal visit  Plan   pregnancy4 Problems (from 09/10/18 to present)    Problem Noted Resolved   Anemia affecting pregnancy 05/12/2019 by Dalia Heading, CNM No   Overview Signed 05/12/2019  8:34 AM by Dalia Heading, CNM    Received IV iron infusion 3/30 at [place] [facility]      Preterm labor in third trimester without delivery 05/05/2019 by Will Bonnet, MD No   Overview Signed 05/05/2019  9:30 PM by Will Bonnet, MD    Admitted to [facility] on 3/28-3/31/21. S/p bmtz 3/28-3/29 S/p iron infusion at [facility] Dilated to 3cm at [facility], then stopped.      Pelvic pain affecting pregnancy in third trimester, antepartum 05/01/2019 by Gae Dry, MD No   Supervision of other normal pregnancy, antepartum 11/10/2018 by Rexene Agent, CNM No   Overview Addendum 05/12/2019  8:35 AM by Dalia Heading, [place] Prenatal Labs  Dating  LMP=10 wk [place] Blood type: A/Positive/-- (10/19 1136)   Genetic Screen declines Antibody:Negative (10/19 1136)  Anatomic [place] normal Rubella: 2.66 (10/19 1136) Varicella: Immune  GTT Early: 96 Third trimester: 108 RPR: Non Reactive (10/19 1136)   Rhogam N/A HBsAg: Negative (10/19 1136)   TDaP vaccine    04/29/19                   Flu Shot: HIV: Non Reactive (10/19 1136)   Baby Food                                GBS:  Contraception BTL (consent signed 3/26 Pap: 11/09/2018, NILM  CBB     CS/VBAC    Support Person                  Preterm labor symptoms and general obstetric precautions including but not limited to vaginal bleeding, contractions, leaking of fluid and fetal movement were reviewed in detail with the patient. Please refer to After Visit Summary for other counseling  recommendations.   - GBS/Aptima today - repeat CBC today  Return in about 1 week (around 05/31/2019) for Routine Prenatal Appointment.  Thomasene Mohair, MD, [facility], [facility] 05/24/2019 2:17 PM

## 2019-05-25 LAB — CBC
Hematocrit: 31.7 % — ABNORMAL LOW (ref 34.0–46.6)
Hemoglobin: 10.8 g/dL — ABNORMAL LOW (ref 11.1–15.9)
MCH: 27.4 pg (ref 26.6–33.0)
MCHC: 34.1 g/dL (ref 31.5–35.7)
MCV: 81 fL (ref 79–97)
Platelets: 258 10*3/uL (ref 150–450)
RBC: 3.94 x10E6/uL (ref 3.77–5.28)
RDW: 15.6 % — ABNORMAL HIGH (ref 11.7–15.4)
WBC: 7.6 10*3/uL (ref 3.4–10.8)

## 2019-05-26 LAB — CERVICOVAGINAL ANCILLARY ONLY
Chlamydia: NEGATIVE
Comment: NEGATIVE
Comment: NORMAL
Neisseria Gonorrhea: NEGATIVE

## 2019-05-26 LAB — STREP GP B NAA: Strep Gp B NAA: POSITIVE — AB

## 2019-06-03 ENCOUNTER — Ambulatory Visit (INDEPENDENT_AMBULATORY_CARE_PROVIDER_SITE_OTHER): Payer: Medicaid Other | Admitting: Obstetrics and Gynecology

## 2019-06-03 ENCOUNTER — Other Ambulatory Visit: Payer: Self-pay

## 2019-06-03 ENCOUNTER — Encounter: Payer: Self-pay | Admitting: Obstetrics and Gynecology

## 2019-06-03 VITALS — BP 122/70 | Ht 61.0 in | Wt 180.0 lb

## 2019-06-03 DIAGNOSIS — O34219 Maternal care for unspecified type scar from previous cesarean delivery: Secondary | ICD-10-CM

## 2019-06-03 DIAGNOSIS — O0993 Supervision of high risk pregnancy, unspecified, third trimester: Secondary | ICD-10-CM

## 2019-06-03 DIAGNOSIS — O99013 Anemia complicating pregnancy, third trimester: Secondary | ICD-10-CM

## 2019-06-03 DIAGNOSIS — Z3A38 38 weeks gestation of pregnancy: Secondary | ICD-10-CM

## 2019-06-03 NOTE — Progress Notes (Signed)
OB History & Physical   History of Present Illness:  Chief Complaint: pre-operative visit for repeat c-section  HPI:  Jessica Randall is a 24 y.o. [phone] female at [redacted]w[redacted]d dated by LMP consistent with 11 week ultrasound.  Her pregnancy has been complicated by history of cesarean delivery.    She reports contractions occasionally at night.   She denies leakage of fluid.   She denies vaginal bleeding.   She reports fetal movement.    Total weight gain for pregnancy: -10 lb (-4.536 kg)   Obstetrical Problem List: pregnancy4 Problems (from 09/10/18 to present)    Problem Noted Resolved   Anemia affecting pregnancy 05/12/2019 by [name], CNM No   Overview Signed 05/12/2019  8:34 AM by [name], CNM    Received IV iron infusion 3/30 at [facility]      Preterm labor in third trimester without delivery 05/05/2019 by Conard Novak, MD No   Overview Signed 05/05/2019  9:30 PM by Conard Novak, MD    Admitted to [facility] on 3/28-3/31/21. S/p bmtz 3/28-3/29 S/p iron infusion at [facility] Dilated to 3cm at [facility], then stopped.      Pelvic pain affecting pregnancy in third trimester, antepartum 05/01/2019 by Nadara Mustard, MD No   Supervision of high risk pregnancy, antepartum 11/10/2018 by Oswaldo Conroy, CNM No   Overview Addendum 05/12/2019  8:35 AM by [name], CNM    Clinic [facility] Prenatal Labs  Dating  LMP=10 wk [place] Blood type: A/Positive/-- (10/19 1136)   Genetic Screen declines Antibody:Negative (10/19 1136)  Anatomic [place] normal Rubella: 2.66 (10/19 1136) Varicella: Immune  GTT Early: 96 Third trimester: 108 RPR: Non Reactive (10/19 1136)   Rhogam N/A HBsAg: Negative (10/19 1136)   TDaP vaccine    04/29/19                   Flu Shot: HIV: Non Reactive (10/19 1136)   Baby Food                                GBS:   Contraception BTL (consent signed 3/26 Pap: 11/09/2018, NILM  CBB     CS/VBAC    Support Person                  Maternal Medical History:    Past Medical History:  Diagnosis Date  . Anemia   . Anxiety and depression    history of SI  . Cholelithiasis affecting pregnancy in third trimester, antepartum 05/12/2017  . History of drug use    MJ  . History of gonorrhea    with G2  . Pain in symphysis pubis during pregnancy 01/30/2017  . Stroke-like symptoms 03/28/2015  . TIA (transient ischemic attack) 03/28/2015    Past Surgical History:  Procedure Laterality Date  . CESAREAN SECTION N/A 06/18/2017   Procedure: CESAREAN SECTION;  Surgeon: Natale Milch, MD;  Location: [facility] ORS;  Service: Obstetrics;  Laterality: N/A;  . CHOLECYSTECTOMY Right 05/15/2017    No Known Allergies  Prior to Admission medications   Denies    OB History  Gravida Para Term Preterm AB Living  4 3 3  0 0 3  SAB TAB Ectopic Multiple Live Births        0 3    # Outcome Date GA Lbr Len/2nd Weight Sex Delivery Anes PTL Lv  4 Current  3 Term 06/18/17 [redacted]w[redacted]d  5 lb 13.5 oz (2.65 kg) M CS-LTranv Gen  LIV     Complications: Non-reassuring electronic fetal monitoring tracing  2 Term 03/21/15 [redacted]w[redacted]d  6 lb 13 oz (3.09 kg) M Vag-Spont  N LIV  1 Term 01/24/12 [redacted]w[redacted]d  7 lb (3.175 kg) M Vag-Spont  N LIV    Obstetric Comments  01/2012: 7lbs 3oz (Dr. Kenton Kingfisher, [place]) median episotomy; type of tear not documented.   2017 -postpartum preeclampsia    Prenatal care site: [facility]  Social History: She  reports that she has never smoked. She has never used smokeless tobacco. She reports that she does not drink alcohol or use drugs.  Family History: family history includes Breast cancer in her paternal grandmother; Diabetes in her maternal grandmother; Hyperlipidemia in her maternal grandfather and mother; Hypertension in her maternal grandmother and mother; Hyperthyroidism in her mother; Lupus in her mother; Transient ischemic attack in her maternal grandmother and paternal grandmother.   Review of Systems:  Review of Systems   Constitutional: Negative.   HENT: Negative.   Eyes: Negative.   Respiratory: Negative.   Cardiovascular: Negative.   Gastrointestinal: Negative.   Genitourinary: Negative.   Musculoskeletal: Negative.   Skin: Negative.   Neurological: Negative.   Psychiatric/Behavioral: Negative.      Physical Exam:  BP 122/70   Ht 5\' 1"  (1.549 m)   Wt 180 lb (81.6 kg)   LMP 09/10/2018   BMI 34.01 kg/m   Physical Exam Constitutional:      General: She is not in acute distress.    Appearance: Normal appearance. She is well-developed.  HENT:     Head: Normocephalic and atraumatic.  Eyes:     General: No scleral icterus.    Conjunctiva/sclera: Conjunctivae normal.  Cardiovascular:     Rate and Rhythm: Normal rate and regular rhythm.     Heart sounds: No murmur. No friction rub. No gallop.   Pulmonary:     Effort: Pulmonary effort is normal. No respiratory distress.     Breath sounds: Normal breath sounds. No wheezing or rales.  Abdominal:     General: Bowel sounds are normal. There is no distension.     Palpations: Abdomen is soft.     Tenderness: There is no abdominal tenderness. There is no guarding or rebound.     Comments: Gravid NT  Musculoskeletal:        General: Normal range of motion.     Cervical back: Normal range of motion and neck supple.  Neurological:     General: No focal deficit present.     Mental Status: She is alert and oriented to person, place, and time.     Cranial Nerves: No cranial nerve deficit.  Skin:    General: Skin is warm and dry.     Findings: No erythema.  Psychiatric:        Mood and Affect: Mood normal.        Behavior: Behavior normal.        Judgment: Judgment normal.   Baseline FHR: 135 beats/min     Assessment:  Jessica Randall is a 24 y.o. [id number] female at [redacted]w[redacted]d with history of cesarean delivery, desires repeat. Desires permanent sterilization  Plan:  1. Admit to Labor & Delivery on date of surgery 2. CBC, T&S, NPO, IVF 3. GBS  positive.   4. To OR for repeat c-section with tubal ligation 5. 24 y.o. [id number]  with undesired fertility, desires permanent sterilization.  Other reversible forms of contraception were discussed with patient; she declines all other modalities. Permanent nature of as well as associated risks of the procedure discussed with patient including but not limited to: risk of regret, permanence of method, bleeding, infection, injury to surrounding organs and need for additional procedures.  Failure risk of 0.5-1% with increased risk of ectopic gestation if pregnancy occurs was also discussed with patient.     Thomasene Mohair, MD 06/03/2019 4:19 PM

## 2019-06-04 ENCOUNTER — Other Ambulatory Visit: Payer: Self-pay

## 2019-06-04 ENCOUNTER — Observation Stay
Admission: EM | Admit: 2019-06-04 | Discharge: 2019-06-04 | Disposition: A | Discharge status: Home / Self Care | Payer: Medicaid Other | Attending: Obstetrics and Gynecology | Admitting: Obstetrics and Gynecology

## 2019-06-04 ENCOUNTER — Encounter: Payer: Self-pay | Admitting: Obstetrics and Gynecology

## 2019-06-04 DIAGNOSIS — O26893 Other specified pregnancy related conditions, third trimester: Secondary | ICD-10-CM

## 2019-06-04 DIAGNOSIS — O99013 Anemia complicating pregnancy, third trimester: Secondary | ICD-10-CM

## 2019-06-04 DIAGNOSIS — O0990 Supervision of high risk pregnancy, unspecified, unspecified trimester: Secondary | ICD-10-CM

## 2019-06-04 DIAGNOSIS — O471 False labor at or after 37 completed weeks of gestation: Secondary | ICD-10-CM | POA: Diagnosis not present

## 2019-06-04 DIAGNOSIS — Z3A38 38 weeks gestation of pregnancy: Secondary | ICD-10-CM | POA: Diagnosis not present

## 2019-06-04 LAB — RUPTURE OF MEMBRANE (ROM)PLUS: Rom Plus: NEGATIVE

## 2019-06-04 NOTE — OB Triage Note (Signed)
ROM Plus obtained.

## 2019-06-04 NOTE — OB Triage Note (Signed)
Pt. Present with c/o of SROM since 06/03/19 @ 1900.  + FM, no VB, CTX occasional.

## 2019-06-04 NOTE — OB Triage Note (Signed)
Notified provider of laboratory results, ROM Plus (negative).  Provider approved pt. Being D/C'ed to home if she was amenable to him not being able to appear in-person (MD in surgery)  Pt. Was amenable to conditions of D/C per MD. Reviewed labor precautions and D/C instructions with pt. Pt. Will f/u with next scheduled visit.

## 2019-06-08 ENCOUNTER — Encounter
Admission: RE | Admit: 2019-06-08 | Discharge: 2019-06-08 | Disposition: A | Discharge status: Home / Self Care | Payer: Medicaid Other | Source: Ambulatory Visit | Attending: Obstetrics and Gynecology | Admitting: Obstetrics and Gynecology

## 2019-06-08 ENCOUNTER — Other Ambulatory Visit: Payer: Self-pay

## 2019-06-08 DIAGNOSIS — Z01818 Encounter for other preprocedural examination: Secondary | ICD-10-CM | POA: Insufficient documentation

## 2019-06-08 HISTORY — DX: Gastro-esophageal reflux disease without esophagitis: K21.9

## 2019-06-08 NOTE — Patient Instructions (Signed)
Your procedure is scheduled on: 06-14-19 TUESDAY Report to MEDICAL MALL ENTRANCE @ 5:15 AM-SEE INSTRUCTION SHEET ATTACHED FOR PHONE # TO CALL TO BE ESCORTED UP TO LABOR AND DELIVERY  Remember: Instructions that are not followed completely may result in serious medical risk, up to and including death, or upon the discretion of your surgeon and anesthesiologist your surgery may need to be rescheduled.    _x___ 1. Do not eat food after midnight the night before your procedure. NO GUM OR CANDY AFTER MIDNIGHT. You may drink clear liquids up to 2 hours before you are scheduled to arrive at the hospital for your procedure.  Do not drink clear liquids within 2 hours of your scheduled arrival to the hospital.  Clear liquids include  --Water or Apple juice without pulp  -- Gatorade  --Black Coffee or Clear Tea (No milk, no creamers, do not add anything to the coffee or Tea-SUGAR OK TO ADD)   ____Ensure clear carbohydrate drink on the way to the hospital for bariatric patients  ____Ensure clear carbohydrate drink 3 hours before surgery.    __x__ 2. No Alcohol for 24 hours before or after surgery.   __x__3. No Smoking or e-cigarettes for 24 prior to surgery.  Do not use any chewable tobacco products for at least 6 hour prior to surgery   ____  4. Bring all medications with you on the day of surgery if instructed.    __x__ 5. Notify your doctor if there is any change in your medical condition     (cold, fever, infections).    x___6. On the morning of surgery brush your teeth with toothpaste and water.  You may rinse your mouth with mouth wash if you wish.  Do not swallow any toothpaste or mouthwash.   Do not wear jewelry, make-up, hairpins, clips or nail polish.  Do not wear lotions, powders, or perfumes.   Do not shave 48 hours prior to surgery. Men may shave face and neck.  Do not bring valuables to the hospital.    [facility] is not responsible for any belongings or valuables.    Contacts, dentures or bridgework may not be worn into surgery.  Leave your suitcase in the car. After surgery it may be brought to your room.  For patients admitted to the hospital, discharge time is determined by your treatment team.  _  Patients discharged the day of surgery will not be allowed to drive home.  You will need someone to drive you home and stay with you the night of your procedure.    Please read over the following fact sheets that you were given:   [facility] Preparing for Surgery  ____ Take anti-hypertensive listed below, cardiac, seizure, asthma, anti-reflux and psychiatric medicines. These include:  1. NONE  2.  3.  4.  5.  6.  ____Fleets enema or Magnesium Citrate as directed.   _x___ Use CHG Soap or sage wipes as directed on instruction sheet-AVOID NIPPLE AND PRIVATE AREA  ____ Use inhalers on the day of surgery and bring to hospital day of surgery  ____ Stop Metformin and Janumet 2 days prior to surgery.    ____ Take 1/2 of usual insulin dose the night before surgery and none on the morning surgery.   ____ Follow recommendations from Cardiologist, Pulmonologist or PCP regarding stopping Aspirin, Coumadin, Plavix ,Eliquis, Effient, or Pradaxa, and Pletal.  X____Stop Anti-inflammatories such as Advil, Aleve, Ibuprofen, Motrin, Naproxen, Naprosyn, Goodies powders or aspirin products NOW-OK to  take Tylenol    ____ Stop supplements until after surgery.     ____ Bring C-Pap to the hospital.

## 2019-06-10 ENCOUNTER — Encounter: Payer: Medicaid Other | Admitting: Obstetrics and Gynecology

## 2019-06-10 ENCOUNTER — Other Ambulatory Visit: Payer: Medicaid Other

## 2019-06-12 ENCOUNTER — Encounter: Payer: Self-pay | Admitting: Obstetrics and Gynecology

## 2019-06-12 ENCOUNTER — Other Ambulatory Visit: Payer: Self-pay

## 2019-06-12 ENCOUNTER — Inpatient Hospital Stay
Admission: EM | Admit: 2019-06-12 | Discharge: 2019-06-15 | DRG: 787 | Disposition: A | Discharge status: Home / Self Care | Payer: Medicaid Other | Attending: Obstetrics and Gynecology | Admitting: Obstetrics and Gynecology

## 2019-06-12 DIAGNOSIS — Z20822 Contact with and (suspected) exposure to covid-19: Secondary | ICD-10-CM | POA: Diagnosis present

## 2019-06-12 DIAGNOSIS — O99353 Diseases of the nervous system complicating pregnancy, third trimester: Secondary | ICD-10-CM | POA: Diagnosis not present

## 2019-06-12 DIAGNOSIS — O34219 Maternal care for unspecified type scar from previous cesarean delivery: Secondary | ICD-10-CM | POA: Diagnosis not present

## 2019-06-12 DIAGNOSIS — R102 Pelvic and perineal pain: Secondary | ICD-10-CM

## 2019-06-12 DIAGNOSIS — O34211 Maternal care for low transverse scar from previous cesarean delivery: Principal | ICD-10-CM | POA: Diagnosis present

## 2019-06-12 DIAGNOSIS — D62 Acute posthemorrhagic anemia: Secondary | ICD-10-CM | POA: Diagnosis not present

## 2019-06-12 DIAGNOSIS — O0990 Supervision of high risk pregnancy, unspecified, unspecified trimester: Secondary | ICD-10-CM

## 2019-06-12 DIAGNOSIS — O9081 Anemia of the puerperium: Secondary | ICD-10-CM | POA: Diagnosis not present

## 2019-06-12 DIAGNOSIS — Z98891 History of uterine scar from previous surgery: Secondary | ICD-10-CM

## 2019-06-12 DIAGNOSIS — D509 Iron deficiency anemia, unspecified: Secondary | ICD-10-CM | POA: Diagnosis present

## 2019-06-12 DIAGNOSIS — O26893 Other specified pregnancy related conditions, third trimester: Secondary | ICD-10-CM

## 2019-06-12 DIAGNOSIS — Z3A39 39 weeks gestation of pregnancy: Secondary | ICD-10-CM

## 2019-06-12 LAB — RESPIRATORY PANEL BY RT PCR (FLU A&B, COVID)
Influenza A by PCR: NEGATIVE
Influenza B by PCR: NEGATIVE
SARS Coronavirus 2 by RT PCR: NEGATIVE

## 2019-06-12 LAB — CBC
HCT: 33.5 % — ABNORMAL LOW (ref 36.0–46.0)
Hemoglobin: 11.5 g/dL — ABNORMAL LOW (ref 12.0–15.0)
MCH: 27.6 pg (ref 26.0–34.0)
MCHC: 34.3 g/dL (ref 30.0–36.0)
MCV: 80.3 fL (ref 80.0–100.0)
Platelets: 227 10*3/uL (ref 150–400)
RBC: 4.17 MIL/uL (ref 3.87–5.11)
RDW: 16.5 % — ABNORMAL HIGH (ref 11.5–15.5)
WBC: 9.4 10*3/uL (ref 4.0–10.5)
nRBC: 0 % (ref 0.0–0.2)

## 2019-06-12 MED ORDER — LACTATED RINGERS IV SOLN: Freq: Once | INTRAVENOUS | Status: AC

## 2019-06-12 MED ORDER — BUPIVACAINE HCL (PF) 0.5 % IJ SOLN
5.0000 mL | Freq: Once | INTRAMUSCULAR | Status: DC
  Filled 2019-06-12: qty 30

## 2019-06-12 MED ORDER — CEFAZOLIN SODIUM-DEXTROSE 2-4 GM/100ML-% IV SOLN
2.0000 g | INTRAVENOUS | Status: AC
  Administered 2019-06-13: 2 g via INTRAVENOUS
  Filled 2019-06-12: qty 100

## 2019-06-12 MED ORDER — BUPIVACAINE HCL (PF) 0.5 % IJ SOLN: 5.0000 mL | Freq: Once | INTRAMUSCULAR | Status: DC

## 2019-06-12 MED ORDER — SOD CITRATE-CITRIC ACID 500-334 MG/5ML PO SOLN
30.0000 mL | ORAL | Status: AC
  Administered 2019-06-13: 30 mL via ORAL
  Filled 2019-06-12: qty 30

## 2019-06-12 MED ORDER — LACTATED RINGERS IV SOLN: Freq: Once | INTRAVENOUS | Status: DC

## 2019-06-12 MED ORDER — BUPIVACAINE 0.25 % ON-Q PUMP DUAL CATH 400 ML
400.0000 mL | INJECTION | Status: DC
  Filled 2019-06-12 (×2): qty 400

## 2019-06-12 NOTE — OB Triage Note (Addendum)
Pt is a 23yo G4P3 at [redacted]w[redacted]d that presents from ED with c/o ctx that started around 2053 tonight. Pt states pain 10/10 with ctx but does not know how often they are coming. EFM applied and initial FHT 135. Vitals WDL. Pt denies VB, LOF and states positive FM. Pt is a previous C/S and scheduled for a repeat on 06/14/19.

## 2019-06-12 NOTE — Progress Notes (Signed)
CNM in department informed of patient arrival and cervical check of 3.5cm with ctx every 2-5 min and previous C/S and scheduled for repeat 5/11.

## 2019-06-13 ENCOUNTER — Other Ambulatory Visit: Admission: RE | Admit: 2019-06-13 | Payer: Medicaid Other | Source: Ambulatory Visit

## 2019-06-13 ENCOUNTER — Inpatient Hospital Stay: Payer: Medicaid Other | Admitting: Anesthesiology

## 2019-06-13 ENCOUNTER — Encounter
Admission: EM | Disposition: A | Discharge status: Home / Self Care | Payer: Self-pay | Source: Home / Self Care | Attending: Obstetrics and Gynecology

## 2019-06-13 ENCOUNTER — Encounter: Payer: Self-pay | Admitting: Obstetrics and Gynecology

## 2019-06-13 DIAGNOSIS — Z3A39 39 weeks gestation of pregnancy: Secondary | ICD-10-CM

## 2019-06-13 DIAGNOSIS — O99353 Diseases of the nervous system complicating pregnancy, third trimester: Secondary | ICD-10-CM

## 2019-06-13 DIAGNOSIS — O34219 Maternal care for unspecified type scar from previous cesarean delivery: Secondary | ICD-10-CM

## 2019-06-13 LAB — TYPE AND SCREEN
ABO/RH(D): A POS
Antibody Screen: NEGATIVE

## 2019-06-13 LAB — COMPREHENSIVE METABOLIC PANEL
ALT: 12 U/L (ref 0–44)
AST: 15 U/L (ref 15–41)
Albumin: 2.8 g/dL — ABNORMAL LOW (ref 3.5–5.0)
Alkaline Phosphatase: 148 U/L — ABNORMAL HIGH (ref 38–126)
Anion gap: 9 (ref 5–15)
BUN: <5 mg/dL — ABNORMAL LOW (ref 6–20)
CO2: 21 mmol/L — ABNORMAL LOW (ref 22–32)
Calcium: 8.4 mg/dL — ABNORMAL LOW (ref 8.9–10.3)
Chloride: 107 mmol/L (ref 98–111)
Creatinine, Ser: 0.37 mg/dL — ABNORMAL LOW (ref 0.44–1.00)
GFR calc Af Amer: >60 mL/min (ref >60)
GFR calc non Af Amer: >60 mL/min (ref >60)
Glucose, Bld: 85 mg/dL (ref 70–99)
Potassium: 2.8 mmol/L — ABNORMAL LOW (ref 3.5–5.1)
Sodium: 137 mmol/L (ref 135–145)
Total Bilirubin: 0.7 mg/dL (ref 0.3–1.2)
Total Protein: 5.7 g/dL — ABNORMAL LOW (ref 6.5–8.1)

## 2019-06-13 LAB — RAPID HIV SCREEN (HIV 1/2 AB+AG)
HIV 1/2 Antibodies: NONREACTIVE
HIV-1 P24 Antigen - HIV24: NONREACTIVE

## 2019-06-13 LAB — RPR: RPR Ser Ql: NONREACTIVE

## 2019-06-13 SURGERY — Surgical Case
Anesthesia: Spinal | Site: Abdomen

## 2019-06-13 MED ORDER — NALOXONE HCL 0.4 MG/ML IJ SOLN: 0.4000 mg | INTRAMUSCULAR | Status: DC | PRN

## 2019-06-13 MED ORDER — NALBUPHINE HCL 10 MG/ML IJ SOLN: 5.0000 mg | INTRAMUSCULAR | Status: DC | PRN

## 2019-06-13 MED ORDER — DIPHENHYDRAMINE HCL 25 MG PO CAPS
25.0000 mg | ORAL_CAPSULE | ORAL | Status: DC | PRN
  Administered 2019-06-15: 25 mg via ORAL
  Filled 2019-06-13: qty 1

## 2019-06-13 MED ORDER — OXYTOCIN 40 UNITS IN NORMAL SALINE INFUSION - SIMPLE MED
INTRAVENOUS | Status: AC
  Filled 2019-06-13: qty 1000

## 2019-06-13 MED ORDER — LACTATED RINGERS IV SOLN
INTRAVENOUS | Status: DC | PRN
Start: 2019-06-13 — End: 2019-06-13

## 2019-06-13 MED ORDER — WITCH HAZEL-GLYCERIN EX PADS: 1.0000 "application " | MEDICATED_PAD | CUTANEOUS | Status: DC | PRN

## 2019-06-13 MED ORDER — COCONUT OIL OIL: 1.0000 "application " | TOPICAL_OIL | Status: DC | PRN

## 2019-06-13 MED ORDER — OXYTOCIN 40 UNITS IN NORMAL SALINE INFUSION - SIMPLE MED
2.5000 [IU]/h | INTRAVENOUS | Status: AC
  Filled 2019-06-13: qty 1000

## 2019-06-13 MED ORDER — BISACODYL 10 MG RE SUPP
10.0000 mg | Freq: Every day | RECTAL | Status: DC | PRN
  Filled 2019-06-13: qty 1

## 2019-06-13 MED ORDER — POTASSIUM CHLORIDE 10 MEQ/100ML IV SOLN
10.0000 meq | INTRAVENOUS | Status: AC
  Administered 2019-06-13 – 2019-06-14 (×4): 10 meq via INTRAVENOUS
  Filled 2019-06-13 (×4): qty 100

## 2019-06-13 MED ORDER — BUPIVACAINE IN DEXTROSE 0.75-8.25 % IT SOLN
INTRATHECAL | Status: DC | PRN
  Administered 2019-06-13: 1.6 mL via INTRATHECAL

## 2019-06-13 MED ORDER — KETOROLAC TROMETHAMINE 30 MG/ML IJ SOLN
30.0000 mg | Freq: Four times a day (QID) | INTRAMUSCULAR | Status: AC
  Administered 2019-06-13: 30 mg via INTRAVENOUS
  Filled 2019-06-13 (×2): qty 1

## 2019-06-13 MED ORDER — PRENATAL MULTIVITAMIN CH
1.0000 | ORAL_TABLET | Freq: Every day | ORAL | Status: DC
  Administered 2019-06-13 – 2019-06-15 (×3): 1 via ORAL
  Filled 2019-06-13 (×3): qty 1

## 2019-06-13 MED ORDER — FENTANYL CITRATE (PF) 100 MCG/2ML IJ SOLN
INTRAMUSCULAR | Status: DC | PRN
  Administered 2019-06-13: 15 ug via INTRAVENOUS

## 2019-06-13 MED ORDER — DIBUCAINE (PERIANAL) 1 % EX OINT: 1.0000 "application " | TOPICAL_OINTMENT | CUTANEOUS | Status: DC | PRN

## 2019-06-13 MED ORDER — HYDROMORPHONE HCL 1 MG/ML IJ SOLN: 0.2000 mg | INTRAMUSCULAR | Status: DC | PRN

## 2019-06-13 MED ORDER — SODIUM CHLORIDE 0.9 % IV SOLN: INTRAVENOUS | Status: DC | PRN

## 2019-06-13 MED ORDER — SIMETHICONE 80 MG PO CHEW
80.0000 mg | CHEWABLE_TABLET | Freq: Three times a day (TID) | ORAL | Status: DC
  Administered 2019-06-13 – 2019-06-15 (×7): 80 mg via ORAL
  Filled 2019-06-13 (×7): qty 1

## 2019-06-13 MED ORDER — OXYTOCIN 40 UNITS IN NORMAL SALINE INFUSION - SIMPLE MED
INTRAVENOUS | Status: DC | PRN
  Administered 2019-06-13: 1 mL via INTRAVENOUS
  Administered 2019-06-13: 499 mL via INTRAVENOUS

## 2019-06-13 MED ORDER — NALOXONE HCL 4 MG/10ML IJ SOLN
1.0000 ug/kg/h | INTRAVENOUS | Status: DC | PRN
  Filled 2019-06-13: qty 5

## 2019-06-13 MED ORDER — ONDANSETRON HCL 4 MG/2ML IJ SOLN
4.0000 mg | Freq: Three times a day (TID) | INTRAMUSCULAR | Status: DC | PRN
  Administered 2019-06-13: 4 mg via INTRAVENOUS
  Filled 2019-06-13: qty 2

## 2019-06-13 MED ORDER — FLEET ENEMA 7-19 GM/118ML RE ENEM: 1.0000 | ENEMA | Freq: Every day | RECTAL | Status: DC | PRN

## 2019-06-13 MED ORDER — MENTHOL 3 MG MT LOZG
1.0000 | LOZENGE | OROMUCOSAL | Status: DC | PRN
  Filled 2019-06-13: qty 9

## 2019-06-13 MED ORDER — OXYCODONE HCL 5 MG PO TABS
5.0000 mg | ORAL_TABLET | ORAL | Status: DC | PRN
  Administered 2019-06-13 – 2019-06-14 (×3): 5 mg via ORAL
  Administered 2019-06-14 – 2019-06-15 (×3): 10 mg via ORAL
  Filled 2019-06-13 (×2): qty 1
  Filled 2019-06-13 (×2): qty 2
  Filled 2019-06-13: qty 1
  Filled 2019-06-13: qty 2

## 2019-06-13 MED ORDER — PHENYLEPHRINE HCL-NACL 10-0.9 MG/250ML-% IV SOLN
INTRAVENOUS | Status: DC | PRN
  Administered 2019-06-13: 45 ug/min via INTRAVENOUS

## 2019-06-13 MED ORDER — LACTATED RINGERS IV SOLN: INTRAVENOUS | Status: DC

## 2019-06-13 MED ORDER — BUPIVACAINE HCL (PF) 0.5 % IJ SOLN
INTRAMUSCULAR | Status: DC | PRN
  Administered 2019-06-13: 10 mL

## 2019-06-13 MED ORDER — SCOPOLAMINE 1 MG/3DAYS TD PT72
1.0000 | MEDICATED_PATCH | Freq: Once | TRANSDERMAL | Status: DC
  Administered 2019-06-13: 1.5 mg via TRANSDERMAL
  Filled 2019-06-13: qty 1

## 2019-06-13 MED ORDER — SIMETHICONE 80 MG PO CHEW: 80.0000 mg | CHEWABLE_TABLET | ORAL | Status: DC | PRN

## 2019-06-13 MED ORDER — BUPIVACAINE ON-Q PAIN PUMP (FOR ORDER SET NO CHG)
INJECTION | Status: DC
  Filled 2019-06-13: qty 1

## 2019-06-13 MED ORDER — ENOXAPARIN SODIUM 40 MG/0.4ML ~~LOC~~ SOLN
40.0000 mg | SUBCUTANEOUS | Status: DC
  Administered 2019-06-14 – 2019-06-15 (×2): 40 mg via SUBCUTANEOUS
  Filled 2019-06-13 (×3): qty 0.4

## 2019-06-13 MED ORDER — DIPHENHYDRAMINE HCL 50 MG/ML IJ SOLN: 12.5000 mg | INTRAMUSCULAR | Status: DC | PRN

## 2019-06-13 MED ORDER — NALBUPHINE HCL 10 MG/ML IJ SOLN
5.0000 mg | Freq: Once | INTRAMUSCULAR | Status: AC | PRN
  Administered 2019-06-13: 5 mg via INTRAVENOUS
  Filled 2019-06-13: qty 1

## 2019-06-13 MED ORDER — MORPHINE SULFATE (PF) 0.5 MG/ML IJ SOLN
INTRAMUSCULAR | Status: DC | PRN
  Administered 2019-06-13: .01 mg via EPIDURAL

## 2019-06-13 MED ORDER — SODIUM CHLORIDE 0.9 % IV SOLN: INTRAVENOUS | Status: DC

## 2019-06-13 MED ORDER — IBUPROFEN 800 MG PO TABS
800.0000 mg | ORAL_TABLET | Freq: Three times a day (TID) | ORAL | Status: DC
  Administered 2019-06-13 – 2019-06-15 (×8): 800 mg via ORAL
  Filled 2019-06-13 (×8): qty 1

## 2019-06-13 MED ORDER — SODIUM CHLORIDE 0.9% FLUSH: 3.0000 mL | INTRAVENOUS | Status: DC | PRN

## 2019-06-13 MED ORDER — PHENYLEPHRINE 40 MCG/ML (10ML) SYRINGE FOR IV PUSH (FOR BLOOD PRESSURE SUPPORT)
PREFILLED_SYRINGE | INTRAVENOUS | Status: DC | PRN
  Administered 2019-06-13: 100 ug via INTRAVENOUS

## 2019-06-13 MED ORDER — NALBUPHINE HCL 10 MG/ML IJ SOLN: 5.0000 mg | Freq: Once | INTRAMUSCULAR | Status: AC | PRN

## 2019-06-13 MED ORDER — MEPERIDINE HCL 25 MG/ML IJ SOLN: 6.2500 mg | INTRAMUSCULAR | Status: DC | PRN

## 2019-06-13 MED ORDER — ZOLPIDEM TARTRATE 5 MG PO TABS: 5.0000 mg | ORAL_TABLET | Freq: Every evening | ORAL | Status: DC | PRN

## 2019-06-13 MED ORDER — SIMETHICONE 80 MG PO CHEW
80.0000 mg | CHEWABLE_TABLET | ORAL | Status: DC
  Administered 2019-06-13 – 2019-06-14 (×2): 80 mg via ORAL
  Filled 2019-06-13 (×2): qty 1

## 2019-06-13 MED ORDER — FERROUS SULFATE 325 (65 FE) MG PO TABS
325.0000 mg | ORAL_TABLET | Freq: Two times a day (BID) | ORAL | Status: DC
  Administered 2019-06-13 – 2019-06-15 (×5): 325 mg via ORAL
  Filled 2019-06-13 (×5): qty 1

## 2019-06-13 MED ORDER — OXYTOCIN 10 UNIT/ML IJ SOLN: INTRAMUSCULAR | Status: DC | PRN

## 2019-06-13 MED ORDER — SENNOSIDES-DOCUSATE SODIUM 8.6-50 MG PO TABS
2.0000 | ORAL_TABLET | ORAL | Status: DC
  Administered 2019-06-13 – 2019-06-14 (×2): 2 via ORAL
  Filled 2019-06-13 (×2): qty 2

## 2019-06-13 MED ORDER — DIPHENHYDRAMINE HCL 25 MG PO CAPS: 25.0000 mg | ORAL_CAPSULE | Freq: Four times a day (QID) | ORAL | Status: DC | PRN

## 2019-06-13 MED ORDER — ACETAMINOPHEN 500 MG PO TABS
1000.0000 mg | ORAL_TABLET | Freq: Four times a day (QID) | ORAL | Status: AC
  Administered 2019-06-13 (×4): 1000 mg via ORAL
  Filled 2019-06-13 (×4): qty 2

## 2019-06-13 SURGICAL SUPPLY — 26 items
CANISTER SUCT 3000ML PPV (MISCELLANEOUS) ×3 IMPLANT
CHLORAPREP W/TINT 26 (MISCELLANEOUS) ×6 IMPLANT
DERMABOND ADVANCED (GAUZE/BANDAGES/DRESSINGS) ×4
DERMABOND ADVANCED .7 DNX12 (GAUZE/BANDAGES/DRESSINGS) ×1 IMPLANT
DRSG OPSITE POSTOP 4X10 (GAUZE/BANDAGES/DRESSINGS) ×3 IMPLANT
ELECT CAUTERY BLADE 6.4 (BLADE) ×3 IMPLANT
ELECT REM PT RETURN 9FT ADLT (ELECTROSURGICAL) ×3
ELECTRODE REM PT RTRN 9FT ADLT (ELECTROSURGICAL) ×1 IMPLANT
GLOVE BIO SURGEON STRL SZ7 (GLOVE) ×4 IMPLANT
GLOVE BIOGEL PI IND STRL 6.5 (GLOVE) ×2 IMPLANT
GLOVE BIOGEL PI INDICATOR 6.5 (GLOVE) ×4
GLOVE INDICATOR 7.0 STRL GRN (GLOVE) ×4 IMPLANT
GOWN STRL REUS W/ TWL LRG LVL3 (GOWN DISPOSABLE) ×1 IMPLANT
GOWN STRL REUS W/ TWL XL LVL3 (GOWN DISPOSABLE) ×2 IMPLANT
GOWN STRL REUS W/TWL LRG LVL3 (GOWN DISPOSABLE) ×2
GOWN STRL REUS W/TWL XL LVL3 (GOWN DISPOSABLE) ×4
NS IRRIG 1000ML POUR BTL (IV SOLUTION) ×3 IMPLANT
PACK C SECTION (MISCELLANEOUS) ×3 IMPLANT
PAD OB MATERNITY 4.3X12.25 (PERSONAL CARE ITEMS) ×5 IMPLANT
PAD PREP 24X41 OB/GYN DISP (PERSONAL CARE ITEMS) ×3 IMPLANT
PENCIL SMOKE ULTRAEVAC 22 CON (MISCELLANEOUS) ×3 IMPLANT
SUT MNCRL AB 4-0 PS2 18 (SUTURE) ×3 IMPLANT
SUT PLAIN 3-0 (SUTURE) ×3 IMPLANT
SUT VIC AB 0 CT1 36 (SUTURE) ×9 IMPLANT
SUT VIC AB 2-0 CT1 36 (SUTURE) ×3 IMPLANT
SYR 30ML LL (SYRINGE) ×6 IMPLANT

## 2019-06-13 NOTE — Op Note (Signed)
Cesarean Section Procedure Note 06/13/19  Pre-operative Diagnosis:  1. History of prior LTCS, desires repeat  2. [redacted] week gestation, early labor Post-operative Diagnosis: same, delivered. Procedure: Repeat Low Transverse Cesarean Section   Surgeon: Adelene Idler MD   Assistant(s): [name] CNM - No other skilled surgical assistant available. Anesthesia: Spinal Estimated Blood Loss: 500 cc Complications: None; patient tolerated the procedure well.   Disposition: PACU - hemodynamically stable. Condition: stable   Findings: A female infant in the cephalic presentation. Amniotic fluid - clear  Birth weight: 7 lbs 0oz Apgars of 9 and 9.  Intact placenta with a three-vessel cord. Grossly normal uterus, tubes and ovaries bilaterally. No intraabdominal adhesions were noted.   Procedure Details    The patient was taken to operating room, identified as the correct patient and the procedure verified as C-Section Delivery. A time out was held and the above information confirmed. After induction of anesthesia, the patient was draped and prepped in the usual sterile manner. A Pfannenstiel incision was made and carried down through the subcutaneous tissue to the fascia. Fascial incision was made and extended transversely with the Mayo scissors. The fascia was separated from the underlying rectus tissue superiorly and inferiorly. The peritoneum was identified and entered bluntly. Peritoneal incision was extended longitudinally. A low transverse hysterotomy was made. The fetus was delivered atraumatically. The umbilical cord was clamped x2 and cut and the infant was handed to the awaiting pediatricians. The placenta was removed intact and appeared normal with a 3-vessel cord. The uterus was exteriorized and cleared of all clot and debris. The hysterotomy was closed with running sutures of 0 Vicryl suture. Excellent hemostasis was observed.  The uterus was returned to the abdomen. The pelvis was  irrigated and again, excellent hemostasis was noted. The peritoneum was closed with a running stitch of 2-0 Vicryl. The On Q Pain pump System was then placed.  Trocars were placed through the abdominal wall into the subfascial space and these were used to thread the silver soaker cathaters into place.The rectus muscles were inspected and were hemostatic. The rectus fascia was then reapproximated with running sutures of 0-vicryl, with careful placement not to incorporate the cathaters. Subcutaneous tissues are then irrigated with saline and hemostasis assured with the bovie. The subcutaneous fat was approximated with 3-0 plain and a running stitch.  The skin was closed with 4-0 monocryl suture in a subcuticular fashion followed by skin adhesive. The cathaters are flushed each with 5 mL of Bupivicaine and stabilized into place with dressing. Instrument, sponge, and needle counts were correct prior to the abdominal closure and at the conclusion of the case.  The patient tolerated the procedure well and was transferred to the recovery room in stable condition.   [name] CNM assisted with all aspects of the surgery with retraction of tissue and fundal pressure.   Natale Milch MD [facility], [facility] 06/13/19 2:24 AM

## 2019-06-13 NOTE — Anesthesia Procedure Notes (Signed)
Spinal  Patient location during procedure: OR Start time: 06/13/2019 1:14 AM End time: 06/13/2019 1:17 AM Staffing Performed: anesthesiologist  Anesthesiologist: Tera Mater, MD Preanesthetic Checklist Completed: patient identified, IV checked, site marked, risks and benefits discussed, surgical consent, monitors and equipment checked, pre-op evaluation and timeout performed Spinal Block Patient position: sitting Prep: ChloraPrep Patient monitoring: heart rate, continuous pulse ox, blood pressure and cardiac monitor Approach: midline Location: L3-4 Injection technique: single-shot Needle Needle type: Whitacre and Introducer  Needle gauge: 24 G Needle length: 9 cm Additional Notes Negative paresthesia. Negative blood return. Positive free-flowing CSF. Expiration date of kit checked and confirmed. Patient tolerated procedure well, without complications.

## 2019-06-13 NOTE — Discharge Summary (Signed)
See final progress note. 

## 2019-06-13 NOTE — Discharge Summary (Signed)
OB Discharge Summary     Patient Name: Jessica Randall DOB: 05/30/1995 MRN: [id number]  Date of admission: 06/12/2019 Delivering MD: Mirna Mires, CNM  Date of Delivery: 06/12/2019  Date of discharge: 06/15/2019  Admitting diagnosis: History of cesarean delivery [Z98.891] Intrauterine pregnancy: [redacted]w[redacted]d     Secondary diagnosis: Anemia     Discharge diagnosis: Term Pregnancy Delivered, Reasons for cesarean section  Elective repeat                         Hospital course:  Onset of Labor With Unplanned C/S  24 y.o. yo [id number] at [redacted]w[redacted]d was admitted in Latent Labor on 06/12/2019. Patient had a labor course significant for painful contractions. Membrane Rupture Time/Date: 1:37 AM ,06/13/2019   The patient went for cesarean section due to Elective Repeat, and delivered a Viable infant,06/13/2019  Details of operation can be found in separate operative note. Patient had an uncomplicated postpartum course.  She is ambulating,tolerating a regular diet, passing flatus, and urinating well.  Patient is discharged home in stable condition 06/15/19.                                                                 Post partum procedures:none  Complications: None  Physical exam on 06/15/2019: Vitals:   06/14/19 0753 06/14/19 1557 06/14/19 2334 06/15/19 0829  BP: 114/75 110/60 114/70 126/74  Pulse: 72 70 77 69  Resp: 18 18 18 20   Temp: 97.9 F (36.6 C) 98.4 F (36.9 C) 98.2 F (36.8 C) 98.2 F (36.8 C)  TempSrc: Oral Oral Oral Oral  SpO2: 98%  100% 100%  Weight:      Height:       General: alert, cooperative and no distress Lochia: appropriate Uterine Fundus: firm Incision: Healing well with no significant drainage, Dressing is clean, dry, and intact DVT Evaluation: No evidence of DVT seen on physical exam. Negative Homan's sign. No cords or calf tenderness.  Labs: Lab Results  Component Value Date   WBC 8.4 06/14/2019   HGB 10.0 (L) 06/14/2019   HCT 29.2 (L) 06/14/2019   MCV 81.1  06/14/2019   PLT 203 06/14/2019   CMP Latest Ref Rng & Units 06/14/2019  Glucose 70 - 99 mg/dL -  BUN 6 - 20 mg/dL -  Creatinine 08/14/2019 - 2.11 mg/dL -  Sodium 9.41 - 740 mmol/L -  Potassium 3.5 - 5.1 mmol/L 3.4(L)  Chloride 98 - 111 mmol/L -  CO2 22 - 32 mmol/L -  Calcium 8.9 - 10.3 mg/dL -  Total Protein 6.5 - 8.1 g/dL -  Total Bilirubin 0.3 - 1.2 mg/dL -  Alkaline Phos 38 - 814 U/L -  AST 15 - 41 U/L -  ALT 0 - 44 U/L -    Discharge instruction: per After Visit Summary.  Medications:  Allergies as of 06/15/2019   No Known Allergies     Medication List    TAKE these medications   enoxaparin 40 MG/0.4ML injection Commonly known as: LOVENOX Inject 0.4 mLs (40 mg total) into the skin daily for 21 doses. Start taking on: Jun 16, 2019   ibuprofen 800 MG tablet Commonly known as: ADVIL Take 1 tablet (800 mg total) by mouth every 8 (eight) hours.  oxyCODONE 5 MG immediate release tablet Commonly known as: Oxy IR/ROXICODONE Take 1-2 tablets (5-10 mg total) by mouth every 4 (four) hours as needed for moderate pain.       Diet: routine diet  Activity: Advance as tolerated. Pelvic rest for 6 weeks.   Outpatient follow up: Follow-up Information    Schuman, Stefanie Libel, MD. Schedule an appointment as soon as possible for a visit in 1 week.   Specialty: Obstetrics and Gynecology Why: For incision check Contact information: [address] [place] [address] [phone]             Postpartum contraception: planned BTL and signed consents, then opted at at delivery. Will need to discuss at her 1 wk PP appt. Rhogam Given postpartum: no Rubella vaccine given postpartum: no Varicella vaccine given postpartum: no TDaP given antepartum or postpartum: Yes  Newborn Data: Live born female  Birth Weight:  7lbs APGAR: , 9,9  Newborn Delivery   Birth date/time: 06/13/2019 01:37:00 Delivery type:        Baby Feeding: Bottle and Breast  Disposition:home with  mother  Careful dc instructions provided.  SIGNED: Imagene Riches, CNM  06/15/2019 11:11 AM

## 2019-06-13 NOTE — Anesthesia Preprocedure Evaluation (Signed)
Anesthesia Evaluation  Patient identified by MRN, date of birth, ID band Patient awake    Reviewed: Allergy & Precautions, H&P , NPO status , Patient's Chart, lab work & pertinent test results  Airway Mallampati: II  TM Distance: >3 FB Neck ROM: full    Dental  (+) Teeth Intact   Pulmonary neg pulmonary ROS,    Pulmonary exam normal        Cardiovascular Exercise Tolerance: Good Normal cardiovascular exam  H/o postpartum preeclampsia.  No preeclampsia with this pregnancy   Neuro/Psych PSYCHIATRIC DISORDERS Anxiety Depression TIA (h/o TIA following previous delivery)   GI/Hepatic   Endo/Other    Renal/GU   negative genitourinary   Musculoskeletal   Abdominal   Peds  Hematology  (+) Blood dyscrasia, anemia ,   Anesthesia Other Findings Past Medical History: No date: Anemia No date: Anxiety and depression     Comment:  history of SI 05/12/2017: Cholelithiasis affecting pregnancy in third trimester,  antepartum No date: GERD (gastroesophageal reflux disease) No date: History of drug use     Comment:  MJ No date: History of gonorrhea     Comment:  with G2 01/30/2017: Pain in symphysis pubis during pregnancy 03/28/2015: Stroke-like symptoms 03/28/2015: TIA (transient ischemic attack)     Comment:  6 days after delivery of baby  Past Surgical History: 06/18/2017: CESAREAN SECTION; N/A     Comment:  Procedure: CESAREAN SECTION;  Surgeon: Natale Milch, MD;  Location: [facility] ORS;  Service:               Obstetrics;  Laterality: N/A; 05/15/2017: CHOLECYSTECTOMY; Right  BMI    Body Mass Index: 33.82 kg/m      Reproductive/Obstetrics (+) Pregnancy                             Anesthesia Physical Anesthesia Plan  ASA: II  Anesthesia Plan: Spinal   Post-op Pain Management:    Induction:   PONV Risk Score and Plan:   Airway Management Planned:   Additional  Equipment:   Intra-op Plan:   Post-operative Plan:   Informed Consent: I have reviewed the patients History and Physical, chart, labs and discussed the procedure including the risks, benefits and alternatives for the proposed anesthesia with the patient or authorized representative who has indicated his/her understanding and acceptance.     Dental Advisory Given  Plan Discussed with: Anesthesiologist  Anesthesia Plan Comments:         Anesthesia Quick Evaluation

## 2019-06-13 NOTE — Transfer of Care (Signed)
Immediate Anesthesia Transfer of Care Note  Patient: Jessica Randall  Procedure(s) Performed: CESAREAN SECTION (N/A Abdomen)  Patient Location: PACU  Anesthesia Type:Spinal  Level of Consciousness: awake, alert  and oriented  Airway & Oxygen Therapy: Patient Spontanous Breathing  Post-op Assessment: Report given to RN and Post -op Vital signs reviewed and stable  Post vital signs: Reviewed and stable  Last Vitals:  Vitals Value Taken Time  BP 134/80 06/13/19 0234  Temp 36.2 C 06/13/19 0234  Pulse 67 06/13/19 0234  Resp 15 06/13/19 0234  SpO2 100 % 06/13/19 0234    Last Pain:  Vitals:   06/13/19 0234  TempSrc: Oral  PainSc:       Patients Stated Pain Goal: 0 (06/12/19 2221)  Complications: No apparent anesthesia complications

## 2019-06-13 NOTE — Plan of Care (Signed)
Transferred to room 345 Post C/S. Alert and oriented with pleasant affect. Color good, skin w&d. Oriented to room, Fall Prevention and POC. Pt. V/O.

## 2019-06-13 NOTE — Progress Notes (Signed)
Obstetric Postpartum/PostOperative Daily Progress Note Subjective:  24 y.o. [id number] post-operative day # 0, 8 hours status post repeat cesarean delivery.  She is not yet ambulating, is tolerating po, is not yet voiding spontaneously.  Her pain is well controlled on PO pain medications and with On Q pump. Her lochia is less than menses.   Medications SCHEDULED MEDICATIONS  . acetaminophen  1,000 mg Oral Q6H  . [START ON 06/14/2019] enoxaparin (LOVENOX) injection  40 mg Subcutaneous Q24H  . ferrous sulfate  325 mg Oral BID WC  . ibuprofen  800 mg Oral Q8H  . ketorolac  30 mg Intravenous Q6H  . prenatal multivitamin  1 tablet Oral Q1200  . scopolamine  1 patch Transdermal Once  . [START ON 06/14/2019] senna-docusate  2 tablet Oral Q24H  . simethicone  80 mg Oral TID PC  . [START ON 06/14/2019] simethicone  80 mg Oral Q24H    MEDICATION INFUSIONS  . bupivacaine ON-Q pain pump    . lactated ringers Stopped (06/13/19 0301)  . naLOXone [facility]) adult infusion for PRURITIS    . oxytocin 2.5 Units/hr (06/13/19 0249)    PRN MEDICATIONS  bisacodyl, coconut oil, witch hazel-glycerin **AND** dibucaine, diphenhydrAMINE **OR** diphenhydrAMINE, diphenhydrAMINE, HYDROmorphone (DILAUDID) injection, menthol-cetylpyridinium, nalbuphine **OR** nalbuphine, naloxone **AND** sodium chloride flush, naLOXone (NARCAN) adult infusion for PRURITIS, ondansetron (ZOFRAN) IV, oxyCODONE, simethicone, sodium phosphate, zolpidem    Objective:   Vitals:   06/13/19 0645 06/13/19 0700 06/13/19 0802 06/13/19 0900  BP: 122/72  132/74   Pulse: 70 75 77 66  Resp: 20  20   Temp: 97.8 F (36.6 C)  98 F (36.7 C)   TempSrc: Oral     SpO2: 100% 97% 98% 98%  Weight:      Height:        Current Vital Signs 24h Vital Sign Ranges  T 98 F (36.7 C) Temp  Avg: 97.8 F (36.6 C)  Min: 97.1 F (36.2 C)  Max: 98.7 F (37.1 C)  BP 132/74 BP  Min: 117/74  Max: 141/80  HR 66 Pulse  Avg: 71.9  Min: 63  Max: 82  RR 20 Resp  Avg:  18.6  Min: 15  Max: 29  SaO2 98 % Room Air SpO2  Avg: 99.2 %  Min: 97 %  Max: 100 %       24 Hour I/O Current Shift I/O  Time Ins Outs 05/09 0701 - 05/10 0700 In: 833 [I.V.:733] Out: 1445 [Urine:475] No intake/output data recorded.  General: NAD Pulmonary: no increased work of breathing Abdomen: non-distended, non-tender, fundus firm at level of umbilicus Inc: Clean/dry/intact, On Q pump intact Extremities: no edema, no erythema, no tenderness  Labs:  Recent Labs  Lab 06/12/19 2339  WBC 9.4  HGB 11.5*  HCT 33.5*  PLT 227     Assessment:   24 y.o. [id number] postoperative day # 0, 8 hour status post repeat cesarean section, lactating  Plan:  1) Acute blood loss anemia - hemodynamically stable and asymptomatic - po ferrous sulfate  2) A POS / Rubella 2.66 (10/19 1136)/ Varicella Immune  3) TDAP status UTD  4) Breast/Formula feeding/Contraception = not discussed at this time  5) Disposition: continue current care   Tresea Mall, CNM 06/13/2019 10:17 AM

## 2019-06-13 NOTE — H&P (Signed)
Date of Initial H&P: 06/03/2019  Jessica Randall is a 24 y.o. [phone] female with EDC=06/17/2019 at 39wk3d dated by LMP consistent with 11 week ultrasound.  Her pregnancy has been complicated by history of cesarean delivery, iron deficiency anemia (had IV iron infusion 3/30) and a history of a TIA after her 2017 delivery. [facility] has recommended DVT PPX for 3-6 weeks postpartum.  She is scheduled for a repeat CS and a BTL 11 May, but she presented in labor. Has been contracting on and off all day but the contractions became stronger and more regular at 2053 last night. Her cervix changed from 1.5 to 3.5 cm. Last ate a piece of steak at 745PM. Has been nauseous most of the day.  [facility] Prenatal Labs  Dating  LMP=10 wk [place] Blood type: A/Positive/-- (10/19 1136)   Genetic Screen declines Antibody:Negative (10/19 1136)  Anatomic [place] normal Rubella: 2.66 (10/19 1136) Varicella: Immune  GTT Early: 96 Third trimester: 108 RPR: Non Reactive (10/19 1136)   Rhogam N/A HBsAg: Negative (10/19 1136)   TDaP vaccine    04/29/19                   Flu Shot: HIV: Non Reactive (10/19 1136)   Baby Food                                GBS:   Contraception BTL (consent signed 3/26 Pap: 11/09/2018, NILM  CBB     CS/VBAC    Support Person         General: BF breasting thru contractions Heart: RRR without murmur Lungs: CTA bilaterally Abdomen: tender to palpation with contractions, cephalic presentation Contractions: every 3-6 minutes apart FHR: baseline 135 with accelerations to 150s to 160s, moderate variability  Cervix: 3.5/60-70%/-2 on arrival  Results for orders placed or performed during the hospital encounter of 06/12/19 (from the past 24 hour(s))  Respiratory Panel by RT PCR (Flu A&B, Covid) - Nasopharyngeal Swab     Status: None   Collection Time: 06/12/19 10:56 PM   Specimen: Nasopharyngeal Swab  Result Value Ref Range   SARS Coronavirus 2 by RT PCR NEGATIVE NEGATIVE   Influenza A by PCR  NEGATIVE NEGATIVE   Influenza B by PCR NEGATIVE NEGATIVE  CBC     Status: Abnormal   Collection Time: 06/12/19 11:39 PM  Result Value Ref Range   WBC 9.4 4.0 - 10.5 K/uL   RBC 4.17 3.87 - 5.11 MIL/uL   Hemoglobin 11.5 (L) 12.0 - 15.0 g/dL   HCT 33.5 (L) 36.0 - 46.0 %   MCV 80.3 80.0 - 100.0 fL   MCH 27.6 26.0 - 34.0 pg   MCHC 34.3 30.0 - 36.0 g/dL   RDW 16.5 (H) 11.5 - 15.5 %   Platelets 227 150 - 400 K/uL   nRBC 0.0 0.0 - 0.2 %  Rapid HIV screen (HIV 1/2 Ab+Ag)     Status: None   Collection Time: 06/12/19 11:39 PM  Result Value Ref Range   HIV-1 P24 Antigen - HIV24 NON REACTIVE NON REACTIVE   HIV 1/2 Antibodies NON REACTIVE NON REACTIVE   Interpretation (HIV Ag Ab)      A non reactive test result means that HIV 1 or HIV 2 antibodies and HIV 1 p24 antigen were not detected in the specimen.  Type and screen [facility]     Status: None   Collection  Time: 06/12/19 11:39 PM  Result Value Ref Range   ABO/RH(D) A POS    Antibody Screen NEG    Sample Expiration      06/15/2019,2359 Performed at [facility], [address] [place], [place] [address]     A: IUP at 39wk3d in labor Previous Cesarean section-desires repeat and TL  P: Consulted Jessica Randall regarding proceding with CS and TL  A POS/ RI/ VI Breast and bottle TDAP UTD 30 day papers for TL signed 3/26, but unable to find in Epic. Will need Lovenox for PP DVT PPX x 3-6 weeks.  Jessica Randall, CNM

## 2019-06-13 NOTE — Discharge Summary (Signed)
24 y.o. [id number] female who presents for concern for rupture of membranes. She notes +FM, no vaginal bleeding, and no contractions.   She has normal vitals signs and a reactive fetal tracing. She a negative ROM+ test and no evidence of labor.  She is discharged home with reassurance that she has no rupture of membranes. Discharge diagnosis: false labor  Thomasene Mohair, MD, [facility], [facility] 06/13/2019 8:06 AM

## 2019-06-13 NOTE — Interval H&P Note (Signed)
History and Physical Interval Note:  06/13/2019 12:58 AM  Jessica Randall  has presented today for surgery, with the diagnosis of previous c-section, in labor.  The various methods of treatment have been discussed with the patient and family. After consideration of risks, benefits and other options for treatment, the patient has consented to  Procedure(s): REPEAT CESAREAN SECTION as a surgical intervention.  The patient's history has been reviewed, patient examined, no change in status, stable for surgery.  I have reviewed the patient's chart and labs.  Questions were answered to the patient's satisfaction.     Cleon Thoma R Orvile Corona

## 2019-06-14 ENCOUNTER — Encounter: Admission: RE | Payer: Self-pay | Source: Home / Self Care

## 2019-06-14 ENCOUNTER — Inpatient Hospital Stay
Admission: RE | Admit: 2019-06-14 | Payer: Medicaid Other | Source: Home / Self Care | Admitting: Obstetrics and Gynecology

## 2019-06-14 LAB — CBC
HCT: 29.2 % — ABNORMAL LOW (ref 36.0–46.0)
Hemoglobin: 10 g/dL — ABNORMAL LOW (ref 12.0–15.0)
MCH: 27.8 pg (ref 26.0–34.0)
MCHC: 34.2 g/dL (ref 30.0–36.0)
MCV: 81.1 fL (ref 80.0–100.0)
Platelets: 203 10*3/uL (ref 150–400)
RBC: 3.6 MIL/uL — ABNORMAL LOW (ref 3.87–5.11)
RDW: 16.9 % — ABNORMAL HIGH (ref 11.5–15.5)
WBC: 8.4 10*3/uL (ref 4.0–10.5)
nRBC: 0 % (ref 0.0–0.2)

## 2019-06-14 LAB — POTASSIUM: Potassium: 3.4 mmol/L — ABNORMAL LOW (ref 3.5–5.1)

## 2019-06-14 SURGERY — Surgical Case
Anesthesia: Choice

## 2019-06-14 MED ORDER — ACETAMINOPHEN 500 MG PO TABS
1000.0000 mg | ORAL_TABLET | Freq: Four times a day (QID) | ORAL | Status: DC | PRN
  Administered 2019-06-14 – 2019-06-15 (×3): 1000 mg via ORAL
  Filled 2019-06-14 (×3): qty 2

## 2019-06-14 MED ORDER — POTASSIUM CHLORIDE 20 MEQ PO PACK
20.0000 meq | PACK | Freq: Two times a day (BID) | ORAL | Status: DC
  Administered 2019-06-14 (×2): 20 meq via ORAL
  Filled 2019-06-14 (×3): qty 1

## 2019-06-14 NOTE — Progress Notes (Signed)
Post Partum Day 1 Subjective: no complaints, voiding, tolerating PO, + flatus and reports her pain level is 1 out of 10. she is both breast and bottle feeding  Objective: Blood pressure 114/75, pulse 72, temperature 97.9 F (36.6 C), temperature source Oral, resp. rate 18, height 5\' 1"  (1.549 m), weight 81.2 kg, last menstrual period 09/10/2018, SpO2 98 %, unknown if currently breastfeeding.  Physical Exam:  General: alert, cooperative and mildly obese Lochia: appropriate Uterine Fundus: firm Incision: healing well, no significant drainage, On Q is in place under tegaderm DVT Evaluation: No evidence of DVT seen on physical exam. Negative Homan's sign.  Recent Labs    06/12/19 2339 06/14/19 0559  HGB 11.5* 10.0*  HCT 33.5* 29.2*    Assessment/Plan: Plan for discharge tomorrow  Ambulation encouraged today Lactation support.   LOS: 2 days   08/14/19 06/14/2019, 11:43 AM

## 2019-06-14 NOTE — Anesthesia Post-op Follow-up Note (Signed)
  Anesthesia Pain Follow-up Note  Patient: Jessica Randall  Day #: 1  Date of Follow-up: 06/14/2019 Time: 11:16 AM  Last Vitals:  Vitals:   06/14/19 0320 06/14/19 0753  BP: 123/84 114/75  Pulse: 72 72  Resp: 18 18  Temp: 36.8 C 36.6 C  SpO2: 99% 98%    Level of Consciousness: alert  Pain: none   Side Effects:None  Catheter Site Exam:clean, dry, no drainage  Anti-Coag Meds (From admission, onward)   Start     Dose/Rate Route Frequency Ordered Stop   06/14/19 0800  enoxaparin (LOVENOX) injection 40 mg     40 mg Subcutaneous Every 24 hours 06/13/19 0402         Plan: D/C from anesthesia care at surgeon's request  Janene Yousuf Lawerance Cruel

## 2019-06-14 NOTE — Progress Notes (Signed)
Post Partum Day 1 Subjective: Resting in bed. Pain well controlled with PO medication. Rates her pain a 1 out of 10. Denies heavy bleeding, pelvic or abdominal pain.Is both breast and bottle feeding- reports the breastfeeding is going very well.Taking po well, and is passing flatus.  Objective: Blood pressure 114/75, pulse 72, temperature 97.9 F (36.6 C), temperature source Oral, resp. rate 18, height 5\' 1"  (1.549 m), weight 81.2 kg, last menstrual period 09/10/2018, SpO2 98 %, unknown if currently breastfeeding.  Physical Exam:  General: alert, cooperative and mildly obese Lochia: appropriate Uterine Fundus: firm Incision: healing well, no significant drainage, dressing CDI with On Q in place DVT Evaluation: Negative Homan's sign.  Recent Labs    06/12/19 2339 06/14/19 0559  HGB 11.5* 10.0*  HCT 33.5* 29.2*    See Potassium levels- was given IV potassium yesterday  Assessment/Plan: Plan for discharge tomorrow  Encouraged ambulation today Lactation support PRN    LOS: 2 days   08/14/19 06/14/2019, 10:43 AM

## 2019-06-14 NOTE — Anesthesia Postprocedure Evaluation (Signed)
Anesthesia Post Note  Patient: Jessica Randall  Procedure(s) Performed: CESAREAN SECTION (N/A Abdomen)  Patient location during evaluation: Mother Baby Anesthesia Type: Spinal Level of consciousness: oriented and awake and alert Pain management: pain level controlled Vital Signs Assessment: post-procedure vital signs reviewed and stable Respiratory status: spontaneous breathing and respiratory function stable Cardiovascular status: blood pressure returned to baseline and stable Postop Assessment: no headache, no backache, no apparent nausea or vomiting and able to ambulate Anesthetic complications: no     Last Vitals:  Vitals:   06/14/19 0320 06/14/19 0753  BP: 123/84 114/75  Pulse: 72 72  Resp: 18 18  Temp: 36.8 C 36.6 C  SpO2: 99% 98%    Last Pain:  Vitals:   06/14/19 0753  TempSrc: Oral  PainSc:                  [name]

## 2019-06-15 ENCOUNTER — Telehealth: Payer: Self-pay | Admitting: Obstetrics and Gynecology

## 2019-06-15 MED ORDER — POTASSIUM CHLORIDE CRYS ER 20 MEQ PO TBCR
20.0000 meq | EXTENDED_RELEASE_TABLET | Freq: Two times a day (BID) | ORAL | Status: DC
  Administered 2019-06-15: 20 meq via ORAL
  Filled 2019-06-15: qty 1

## 2019-06-15 MED ORDER — ENOXAPARIN SODIUM 40 MG/0.4ML ~~LOC~~ SOLN: 40.0000 mg | SUBCUTANEOUS | 0 refills | Status: DC

## 2019-06-15 MED ORDER — OXYCODONE HCL 5 MG PO TABS: 5.0000 mg | ORAL_TABLET | ORAL | 0 refills | Status: DC | PRN

## 2019-06-15 MED ORDER — IBUPROFEN 800 MG PO TABS: 800.0000 mg | ORAL_TABLET | Freq: Three times a day (TID) | ORAL | 0 refills | Status: DC

## 2019-06-15 NOTE — Progress Notes (Signed)
Patient discharged home with infant. Discharge instructions and prescriptions given and reviewed with patient. Patient verbalized understanding. Waiting for mother to pick her up after son gets out of school later this afternoon. Will be escorted out by auxillary.

## 2019-06-15 NOTE — Discharge Instructions (Addendum)
Discharge Instructions:   Follow-up Appointment: 1 week, please call the office to schedule  If there are any new medications, they have been ordered and will be available for pickup at the listed pharmacy on your way home from the hospital.   Call the office if you have any of the following: headache, visual changes, fever >101.0 F, chills, shortness of breath, breast concerns, excessive vaginal bleeding, incision drainage or problems, leg pain or redness, depression or any other concerns. If you have vaginal discharge with an odor, let your doctor know.   It is normal to bleed for up to 6 weeks. You should not soak through more than 1 pad in 1 hour. If you have a blood clot larger than your fist with continued bleeding, call your doctor.   After a c-section, you should expect a small amount of blood or clear fluid coming from the incision and abdominal cramping/soreness. Inspect your incision site daily. Stand in front of a mirror to look for any redness, incision opening, or discolored/odorness drainage. Take a shower daily and continue good hygiene. Use own towel and washcloth (do not share). Make sure your sheets on your bed are clean. No pets sleeping around your incision site. Dressing will be removed at your postpartum visit. If the dressing does become wet or soiled underneath, it is okay to remove it before your visit.    On-Q pump: You will remove on day 4 after insertion or if the ball becomes flat before day 4. You will remove on: 06/17/2019  Activity: Do not lift > 15 lbs for 6 weeks (do not lift anything heavier than your baby). No intercourse, tampons, swimming pools, hot tubs, baths (only showers) for 6 weeks.  No driving for 1-2 weeks. Do not drive while taking narcotic or opioid pain medication.  Continue taking your prenatal vitamin, especially if breastfeeding. Increase calories and fluids (water) while breastfeeding.   Your milk will come in, in the next couple of days (right  now it is colostrum). You may have a slight fever when your milk comes in, but it should go away on its own.  If it does not, and rises above 101 F please call the doctor. You will also feel achy and your breasts will be firm. They will also start to leak. If you are breastfeeding, continue as you have been and you can pump/express milk for comfort.   If you have too much milk, your breasts can become engorged, which could lead to mastitis. This is an infection of the milk ducts. It can be very painful and you will need to notify your doctor to obtain a prescription for antibiotics. You can also treat it with a shower or hot/cold compress.   For concerns about your baby, please call your pediatrician.  For breastfeeding concerns, the lactation consultant can be reached at [phone].   Postpartum blues (feelings of happy one minute and sad another minute) are normal for the first few weeks but if it gets worse let your doctor know.   Congratulations! We enjoyed caring for you and your new bundle of joy!

## 2019-06-15 NOTE — Telephone Encounter (Signed)
Patient is calling about her prescriptions are not covered under medicaid and needs for something to be sent in. Please advise

## 2019-06-15 NOTE — Progress Notes (Signed)
Post Partum Day 2 Subjective: no complaints, up ad lib, tolerating PO, + flatus and is voiding adequately , but shares that she does not feel any urge to void. she is urinating every few hours but has decreased urge.  both breast and bottle feeding, although using more formula. She would like to be discharged today.  Objective: Blood pressure 126/74, pulse 69, temperature 98.2 F (36.8 C), temperature source Oral, resp. rate 20, height 5\' 1"  (1.549 m), weight 81.2 kg, last menstrual period 09/10/2018, SpO2 100 %, unknown if currently breastfeeding.  Physical Exam:  General: alert, cooperative and moderately obese Lochia: appropriate Uterine Fundus: firm Incision: healing well, no significant drainage, On Q in place DVT Evaluation: No evidence of DVT seen on physical exam. Negative Homan's sign. No cords or calf tenderness. No significant calf/ankle edema. On daily Lovenox  Recent Labs    06/12/19 2339 06/14/19 0559  HGB 11.5* 10.0*  HCT 33.5* 29.2*    Assessment/Plan: Discharge home and Contraception  is now undecided. Earlier had consent for BTL, may opt for an IUD.  Per [facility], will continue on Lovenox for 3-6 weeks post delivery RX for Ibuprofen 600mg  po q 6 hours. Limited po narcotic rx. Follow up in one week with Dr. 08/14/19 for incision check.   LOS: 3 days   06/15/2019, 10:48 AM

## 2019-06-16 NOTE — Telephone Encounter (Signed)
Did pt say what medicine. I do not see any messages

## 2019-06-16 NOTE — Telephone Encounter (Signed)
I believe her pain medicine

## 2019-06-16 NOTE — Telephone Encounter (Signed)
He phone was not working when I called, could not leave message. Does not seem to be connected. Clarity regarding which medication is needed.

## 2019-06-16 NOTE — Telephone Encounter (Signed)
Pt calling again. Please advise.

## 2019-06-17 ENCOUNTER — Encounter: Payer: Self-pay | Admitting: Emergency Medicine

## 2019-06-17 ENCOUNTER — Other Ambulatory Visit: Payer: Self-pay

## 2019-06-17 DIAGNOSIS — Z8269 Family history of other diseases of the musculoskeletal system and connective tissue: Secondary | ICD-10-CM

## 2019-06-17 DIAGNOSIS — Z823 Family history of stroke: Secondary | ICD-10-CM

## 2019-06-17 DIAGNOSIS — Z83438 Family history of other disorder of lipoprotein metabolism and other lipidemia: Secondary | ICD-10-CM

## 2019-06-17 DIAGNOSIS — Z8619 Personal history of other infectious and parasitic diseases: Secondary | ICD-10-CM

## 2019-06-17 DIAGNOSIS — Z803 Family history of malignant neoplasm of breast: Secondary | ICD-10-CM

## 2019-06-17 DIAGNOSIS — Z8673 Personal history of transient ischemic attack (TIA), and cerebral infarction without residual deficits: Secondary | ICD-10-CM

## 2019-06-17 DIAGNOSIS — Z833 Family history of diabetes mellitus: Secondary | ICD-10-CM

## 2019-06-17 DIAGNOSIS — Z8249 Family history of ischemic heart disease and other diseases of the circulatory system: Secondary | ICD-10-CM

## 2019-06-17 DIAGNOSIS — Z9049 Acquired absence of other specified parts of digestive tract: Secondary | ICD-10-CM

## 2019-06-17 DIAGNOSIS — Z8659 Personal history of other mental and behavioral disorders: Secondary | ICD-10-CM

## 2019-06-17 DIAGNOSIS — O1415 Severe pre-eclampsia, complicating the puerperium: Principal | ICD-10-CM | POA: Diagnosis present

## 2019-06-17 DIAGNOSIS — Z98891 History of uterine scar from previous surgery: Secondary | ICD-10-CM

## 2019-06-17 DIAGNOSIS — Z8349 Family history of other endocrine, nutritional and metabolic diseases: Secondary | ICD-10-CM

## 2019-06-17 LAB — CBC
HCT: 33.1 % — ABNORMAL LOW (ref 36.0–46.0)
Hemoglobin: 11 g/dL — ABNORMAL LOW (ref 12.0–15.0)
MCH: 27.9 pg (ref 26.0–34.0)
MCHC: 33.2 g/dL (ref 30.0–36.0)
MCV: 84 fL (ref 80.0–100.0)
Platelets: 276 10*3/uL (ref 150–400)
RBC: 3.94 MIL/uL (ref 3.87–5.11)
RDW: 16.8 % — ABNORMAL HIGH (ref 11.5–15.5)
WBC: 7.2 10*3/uL (ref 4.0–10.5)
nRBC: 0.3 % — ABNORMAL HIGH (ref 0.0–0.2)

## 2019-06-17 LAB — COMPREHENSIVE METABOLIC PANEL
ALT: 38 U/L (ref 0–44)
AST: 32 U/L (ref 15–41)
Albumin: 3.3 g/dL — ABNORMAL LOW (ref 3.5–5.0)
Alkaline Phosphatase: 106 U/L (ref 38–126)
Anion gap: 8 (ref 5–15)
BUN: 8 mg/dL (ref 6–20)
CO2: 24 mmol/L (ref 22–32)
Calcium: 9.3 mg/dL (ref 8.9–10.3)
Chloride: 104 mmol/L (ref 98–111)
Creatinine, Ser: 0.63 mg/dL (ref 0.44–1.00)
GFR calc Af Amer: >60 mL/min (ref >60)
GFR calc non Af Amer: >60 mL/min (ref >60)
Glucose, Bld: 86 mg/dL (ref 70–99)
Potassium: 3.5 mmol/L (ref 3.5–5.1)
Sodium: 136 mmol/L (ref 135–145)
Total Bilirubin: 0.7 mg/dL (ref 0.3–1.2)
Total Protein: 7.3 g/dL (ref 6.5–8.1)

## 2019-06-17 NOTE — ED Triage Notes (Signed)
Patient states that she had a c-section 4 days ago. Patient states that since then she has had pain in her back, headache and ankle swelling.

## 2019-06-18 ENCOUNTER — Encounter: Payer: Self-pay | Admitting: Obstetrics and Gynecology

## 2019-06-18 ENCOUNTER — Inpatient Hospital Stay
Admission: EM | Admit: 2019-06-18 | Discharge: 2019-06-19 | DRG: 776 | Disposition: A | Discharge status: Home / Self Care | Payer: Medicaid Other | Attending: Obstetrics and Gynecology | Admitting: Obstetrics and Gynecology

## 2019-06-18 DIAGNOSIS — Z8673 Personal history of transient ischemic attack (TIA), and cerebral infarction without residual deficits: Secondary | ICD-10-CM | POA: Diagnosis not present

## 2019-06-18 DIAGNOSIS — Z8619 Personal history of other infectious and parasitic diseases: Secondary | ICD-10-CM | POA: Diagnosis not present

## 2019-06-18 DIAGNOSIS — Z83438 Family history of other disorder of lipoprotein metabolism and other lipidemia: Secondary | ICD-10-CM | POA: Diagnosis not present

## 2019-06-18 DIAGNOSIS — Z8249 Family history of ischemic heart disease and other diseases of the circulatory system: Secondary | ICD-10-CM | POA: Diagnosis not present

## 2019-06-18 DIAGNOSIS — R519 Headache, unspecified: Secondary | ICD-10-CM | POA: Diagnosis present

## 2019-06-18 DIAGNOSIS — Z8349 Family history of other endocrine, nutritional and metabolic diseases: Secondary | ICD-10-CM | POA: Diagnosis not present

## 2019-06-18 DIAGNOSIS — O1495 Unspecified pre-eclampsia, complicating the puerperium: Secondary | ICD-10-CM | POA: Diagnosis present

## 2019-06-18 DIAGNOSIS — Z9049 Acquired absence of other specified parts of digestive tract: Secondary | ICD-10-CM | POA: Diagnosis not present

## 2019-06-18 DIAGNOSIS — Z8269 Family history of other diseases of the musculoskeletal system and connective tissue: Secondary | ICD-10-CM | POA: Diagnosis not present

## 2019-06-18 DIAGNOSIS — Z833 Family history of diabetes mellitus: Secondary | ICD-10-CM | POA: Diagnosis not present

## 2019-06-18 DIAGNOSIS — Z8659 Personal history of other mental and behavioral disorders: Secondary | ICD-10-CM | POA: Diagnosis not present

## 2019-06-18 DIAGNOSIS — Z803 Family history of malignant neoplasm of breast: Secondary | ICD-10-CM | POA: Diagnosis not present

## 2019-06-18 DIAGNOSIS — O1415 Severe pre-eclampsia, complicating the puerperium: Secondary | ICD-10-CM | POA: Diagnosis not present

## 2019-06-18 DIAGNOSIS — Z823 Family history of stroke: Secondary | ICD-10-CM | POA: Diagnosis not present

## 2019-06-18 DIAGNOSIS — Z98891 History of uterine scar from previous surgery: Secondary | ICD-10-CM | POA: Diagnosis not present

## 2019-06-18 LAB — URINALYSIS, COMPLETE (UACMP) WITH MICROSCOPIC
Bacteria, UA: NONE SEEN
Bilirubin Urine: NEGATIVE
Glucose, UA: NEGATIVE mg/dL
Ketones, ur: NEGATIVE mg/dL
Leukocytes,Ua: NEGATIVE
Nitrite: NEGATIVE
Protein, ur: NEGATIVE mg/dL
Specific Gravity, Urine: 1.02 (ref 1.005–1.030)
WBC, UA: NONE SEEN WBC/hpf (ref 0–5)
pH: 7 (ref 5.0–8.0)

## 2019-06-18 LAB — MAGNESIUM
Magnesium: 1.8 mg/dL (ref 1.7–2.4)
Magnesium: 5.1 mg/dL — ABNORMAL HIGH (ref 1.7–2.4)

## 2019-06-18 LAB — PROTEIN / CREATININE RATIO, URINE
Creatinine, Urine: 127 mg/dL
Protein Creatinine Ratio: 0.1 mg/mg{Cre} (ref 0.00–0.15)
Total Protein, Urine: 13 mg/dL

## 2019-06-18 LAB — LACTATE DEHYDROGENASE: LDH: 238 U/L — ABNORMAL HIGH (ref 98–192)

## 2019-06-18 LAB — URIC ACID: Uric Acid, Serum: 6.5 mg/dL (ref 2.5–7.1)

## 2019-06-18 MED ORDER — MAGNESIUM SULFATE BOLUS VIA INFUSION
4.0000 g | Freq: Once | INTRAVENOUS | Status: AC
  Administered 2019-06-18: 4 g via INTRAVENOUS
  Filled 2019-06-18: qty 1000

## 2019-06-18 MED ORDER — LABETALOL HCL 5 MG/ML IV SOLN
10.0000 mg | Freq: Once | INTRAVENOUS | Status: AC
  Administered 2019-06-18: 10 mg via INTRAVENOUS
  Filled 2019-06-18: qty 4

## 2019-06-18 MED ORDER — HYDRALAZINE HCL 20 MG/ML IJ SOLN
2.0000 mg | Freq: Once | INTRAMUSCULAR | Status: AC
  Administered 2019-06-18: 2 mg via INTRAVENOUS
  Filled 2019-06-18: qty 1

## 2019-06-18 MED ORDER — CALCIUM GLUCONATE 10 % IV SOLN
INTRAVENOUS | Status: AC
  Filled 2019-06-18: qty 10

## 2019-06-18 MED ORDER — LABETALOL HCL 5 MG/ML IV SOLN: 40.0000 mg | INTRAVENOUS | Status: DC | PRN

## 2019-06-18 MED ORDER — LABETALOL HCL 5 MG/ML IV SOLN: 80.0000 mg | INTRAVENOUS | Status: DC | PRN

## 2019-06-18 MED ORDER — OXYCODONE-ACETAMINOPHEN 5-325 MG PO TABS
2.0000 | ORAL_TABLET | Freq: Once | ORAL | Status: AC
  Administered 2019-06-18: 2 via ORAL
  Filled 2019-06-18: qty 2

## 2019-06-18 MED ORDER — OXYCODONE-ACETAMINOPHEN 5-325 MG PO TABS
1.0000 | ORAL_TABLET | ORAL | Status: DC | PRN
  Administered 2019-06-19 (×2): 1 via ORAL
  Filled 2019-06-18 (×2): qty 1

## 2019-06-18 MED ORDER — LACTATED RINGERS IV SOLN: INTRAVENOUS | Status: DC

## 2019-06-18 MED ORDER — IBUPROFEN 800 MG PO TABS
800.0000 mg | ORAL_TABLET | Freq: Three times a day (TID) | ORAL | Status: DC | PRN
  Filled 2019-06-18: qty 1

## 2019-06-18 MED ORDER — HYDRALAZINE HCL 20 MG/ML IJ SOLN: 10.0000 mg | INTRAMUSCULAR | Status: DC | PRN

## 2019-06-18 MED ORDER — LABETALOL HCL 5 MG/ML IV SOLN: 10.0000 mg | Freq: Once | INTRAVENOUS | Status: DC

## 2019-06-18 MED ORDER — MAGNESIUM SULFATE 40 GM/1000ML IV SOLN
2.0000 g/h | INTRAVENOUS | Status: DC
  Administered 2019-06-18 – 2019-06-19 (×3): 2 g/h via INTRAVENOUS
  Filled 2019-06-18 (×3): qty 1000

## 2019-06-18 MED ORDER — LABETALOL HCL 5 MG/ML IV SOLN: 20.0000 mg | INTRAVENOUS | Status: DC | PRN

## 2019-06-18 MED ORDER — ENOXAPARIN SODIUM 40 MG/0.4ML ~~LOC~~ SOLN
40.0000 mg | SUBCUTANEOUS | Status: DC
  Administered 2019-06-18 – 2019-06-19 (×2): 40 mg via SUBCUTANEOUS
  Filled 2019-06-18 (×2): qty 0.4

## 2019-06-18 MED ORDER — IBUPROFEN 600 MG PO TABS
600.0000 mg | ORAL_TABLET | Freq: Four times a day (QID) | ORAL | Status: DC | PRN
  Administered 2019-06-18 – 2019-06-19 (×4): 600 mg via ORAL
  Filled 2019-06-18 (×4): qty 1

## 2019-06-18 NOTE — H&P (Signed)
OBSTETRICS AND GYNECOLOGY ADMISSION HISTORY AND PHYSICAL NOTE    Attending Provider: Duffy Bruce, MD   Jessica Randall [id number] 06/18/2019 6:34 AM    Chief Complaint:   Jessica Randall is a 24 y.o. [phone] premenopausal female who presented to the ER for headaches, back pain, and lower extremity edema.  History of Present Ilness:   The patient is postpartum day #5 from a repeat cesarean delivery.  She was discharged on 06/15/19 with normal blood pressure after an uncomplicated postpartum hospital course.  The day after discharge she developed a headache that was unrelieved by Tylenol. Her headache is not positional.  She states that the pain is behind her eyes.  She denies visual changes, such as blurry vision and scotomata.  She denies right upper quadrant pain.  She also has had back pain at the epidural site. She is able to touch her chin to her chest.  She also has had some mild bilateral lower extremity edema that she states is not present right now.  She has had lochia that is less than menses. She denies issues with her incision.   Past Medical History:  Diagnosis Date  . Anemia   . Anxiety and depression    history of SI  . Cholelithiasis affecting pregnancy in third trimester, antepartum 05/12/2017  . GERD (gastroesophageal reflux disease)   . History of drug use    MJ  . History of gonorrhea    with G2  . Pain in symphysis pubis during pregnancy 01/30/2017  . Stroke-like symptoms 03/28/2015  . TIA (transient ischemic attack) 03/28/2015   6 days after delivery of baby   Past Surgical History:  Procedure Laterality Date  . CESAREAN SECTION N/A 06/18/2017   Procedure: CESAREAN SECTION;  Surgeon: Homero Fellers, MD;  Location: [facility] ORS;  Service: Obstetrics;  Laterality: N/A;  . CESAREAN SECTION N/A 06/13/2019   Procedure: CESAREAN SECTION;  Surgeon: Homero Fellers, MD;  Location: [facility] ORS;  Service: Obstetrics;  Laterality: N/A;  . CHOLECYSTECTOMY Right  05/15/2017   Allergies: No Known Allergies   Prior to Admission medications   Medication Sig Start Date End Date Taking? Authorizing Provider  ibuprofen (ADVIL) 800 MG tablet Take 1 tablet (800 mg total) by mouth every 8 (eight) hours. 06/15/19   Imagene Riches, CNM  oxyCODONE (OXY IR/ROXICODONE) 5 MG immediate release tablet Take 1-2 tablets (5-10 mg total) by mouth every 4 (four) hours as needed for moderate pain. 06/15/19   Imagene Riches, CNM    Obstetric History: She is a [phone] female s/p SVD x 2 and c-section x 2.    Social History:  She  reports that she has never smoked. She has never used smokeless tobacco. She reports that she does not drink alcohol or use drugs.  Family History:  family history includes Breast cancer in her paternal grandmother; Diabetes in her maternal grandmother; Hyperlipidemia in her maternal grandfather and mother; Hypertension in her maternal grandmother and mother; Hyperthyroidism in her mother; Lupus in her mother; Transient ischemic attack in her maternal grandmother and paternal grandmother.   Review of Systems:   Review of Systems  Constitutional: Negative.  Negative for chills, fever and malaise/fatigue.  HENT: Negative.  Negative for sinus pain.   Eyes: Positive for pain. Negative for blurred vision, double vision, photophobia, discharge and redness.  Respiratory: Negative.  Negative for shortness of breath.   Cardiovascular: Negative.  Negative for chest pain and palpitations.  Gastrointestinal: Negative.  Negative for  abdominal pain, nausea and vomiting.  Genitourinary: Negative.   Musculoskeletal: Negative.   Skin: Negative.   Neurological: Positive for headaches. Negative for dizziness, tingling, tremors, sensory change, speech change, focal weakness, seizures, loss of consciousness and weakness.  Psychiatric/Behavioral: Negative.      Objective    BP (!) 160/93   Pulse 67   Temp 98.2 F (36.8 C) (Oral)   Resp 16   Ht 5\' 1"   (1.549 m)   Wt 81.6 kg   LMP 09/10/2018   SpO2 100%   BMI 34.01 kg/m  Physical Exam Constitutional:      General: She is not in acute distress.    Appearance: Normal appearance. She is well-developed.  HENT:     Head: Normocephalic and atraumatic.  Eyes:     General: No scleral icterus.    Conjunctiva/sclera: Conjunctivae normal.  Cardiovascular:     Rate and Rhythm: Normal rate and regular rhythm.     Heart sounds: No murmur. No friction rub. No gallop.   Pulmonary:     Effort: Pulmonary effort is normal. No respiratory distress.     Breath sounds: Normal breath sounds. No wheezing, rhonchi or rales.  Abdominal:     General: Bowel sounds are normal. There is no distension.     Palpations: Abdomen is soft.     Tenderness: There is no abdominal tenderness. There is no guarding or rebound.     Comments: Uterine fundus at U-3 and firm Incision appears clean, dry, and intact with honeycomb bandage in place  Musculoskeletal:        General: Normal range of motion.     Cervical back: Normal range of motion and neck supple. No rigidity.  Neurological:     General: No focal deficit present.     Mental Status: She is alert and oriented to person, place, and time.     Cranial Nerves: No cranial nerve deficit.     Deep Tendon Reflexes: Reflexes normal.  Skin:    General: Skin is warm and dry.     Findings: No erythema.  Psychiatric:        Mood and Affect: Mood normal. Mood is not anxious or depressed.        Behavior: Behavior normal.        Judgment: Judgment normal.    Lab Results  Component Value Date   WBC 7.2 06/17/2019   HGB 11.0 (L) 06/17/2019   HCT 33.1 (L) 06/17/2019   PLT 276 06/17/2019   CREATININE 0.63 06/17/2019   ALT 38 06/17/2019   AST 32 06/17/2019   PROTCRRATIO 0.10 06/18/2019    Assessment & Plan   Jessica Randall is a 24 y.o. 22 premenopausal female who is postop day #5 with severe preeclampsia by blood pressure and headache criteria   Plan:   1.  Admit to Labor and Delivery for IV magnesium infusion x 24 hours 2.  Start blood pressure treatment protocol for severe preeclampsia 3.  Enoxaparin 40 mg for prophylaxis per previous [facility] recommendation (see Care Everywhere Note from admission to [facility]). 4.  SCDs, PNVs, breast pump.   [facility], MD 06/18/2019 6:34 AM

## 2019-06-18 NOTE — ED Notes (Signed)
Patient c/o her right leg feels heavy. MD informed.

## 2019-06-18 NOTE — ED Notes (Signed)
ED Provider at bedside. 

## 2019-06-18 NOTE — ED Notes (Signed)
Pt reports she is breastfeeding  Pt reports taking tylenol, last dose Thursday  Pt reports that there was a problem with her prescription for pain medication as not corectly filed with her insurance, resulting in her not being able to afford to pick it up  Pt reports possible stroke following previous childbirth, and being placed on lovenox injections (last injection 3 days ago)  Pt reports spinal pain that begins in the mid back radiates upwards.   Ankles swelling and spinal pain began Thursday, headache began Friday

## 2019-06-18 NOTE — Progress Notes (Signed)
Provided patient with breast pumping materials and instructed on importance of regular pumping to maintain and encourage milk supply. The patient verbalized understanding, and stated that her baby was formula fed during the day and only breast fed at night. She denied needing to pump on several occasions and reported no breast discomfort. She will pump this evening.

## 2019-06-18 NOTE — ED Provider Notes (Addendum)
[facility] Emergency Department Provider Note  ____________________________________________   First MD Initiated Contact with Patient 06/18/19 [phone]     (approximate)  I have reviewed the triage vital signs and the nursing notes.   HISTORY  Chief Complaint Headache and Post-op Problem    HPI Jessica Randall is a 24 y.o. female  S/p recent C-Section on 5/10 here with headache, back pain, leg swelling. Pt was just discharged 2 days ago, had been feeling well but unfortunately was not able to get any meds 2/2 insurance issues. Over the past 2 days, she has had worsening back pain related to her epidural which is aching, throbbing, severe, worse w/ movement. She has also developed leg/ankle swelling and a dull, aching, retrobulbar headache and general fatigue. No chest pain. No focal numbness, weakness. No fevers or chills. No drainage or redness or pain from her incision site.        Past Medical History:  Diagnosis Date  . Anemia   . Anxiety and depression    history of SI  . Cholelithiasis affecting pregnancy in third trimester, antepartum 05/12/2017  . GERD (gastroesophageal reflux disease)   . History of drug use    MJ  . History of gonorrhea    with G2  . Pain in symphysis pubis during pregnancy 01/30/2017  . Stroke-like symptoms 03/28/2015  . TIA (transient ischemic attack) 03/28/2015   6 days after delivery of baby    Patient Active Problem List   Diagnosis Date Noted  . Hypertension in pregnancy, preeclampsia, severe, delivered/postpartum 06/18/2019  . Preeclampsia in postpartum period 06/18/2019  . History of cesarean delivery 06/12/2019  . Labor and delivery, indication for care 06/04/2019  . Anemia affecting pregnancy 05/12/2019  . Back pain affecting pregnancy in third trimester 05/05/2019  . Preterm labor in third trimester without delivery 05/05/2019  . Pelvic pain affecting pregnancy in third trimester, antepartum 05/01/2019  .  Indication for care in labor and delivery, antepartum 04/30/2019  . Pelvic pain affecting pregnancy in second trimester, antepartum 03/13/2019  . Supervision of high risk pregnancy, antepartum 11/10/2018  . History of cesarean section complicating pregnancy 06/18/2017    Past Surgical History:  Procedure Laterality Date  . CESAREAN SECTION N/A 06/18/2017   Procedure: CESAREAN SECTION;  Surgeon: Natale Milch, MD;  Location: [facility] ORS;  Service: Obstetrics;  Laterality: N/A;  . CESAREAN SECTION N/A 06/13/2019   Procedure: CESAREAN SECTION;  Surgeon: Natale Milch, MD;  Location: [facility] ORS;  Service: Obstetrics;  Laterality: N/A;  . CHOLECYSTECTOMY Right 05/15/2017    Prior to Admission medications   Medication Sig Start Date End Date Taking? Authorizing Provider  enoxaparin (LOVENOX) 40 MG/0.4ML injection Inject 0.4 mLs (40 mg total) into the skin daily for 21 doses. Patient not taking: Reported on 06/18/2019 06/16/19 07/07/19  Mirna Mires, CNM  ibuprofen (ADVIL) 800 MG tablet Take 1 tablet (800 mg total) by mouth every 8 (eight) hours. Patient not taking: Reported on 06/18/2019 06/15/19   Mirna Mires, CNM  oxyCODONE (OXY IR/ROXICODONE) 5 MG immediate release tablet Take 1-2 tablets (5-10 mg total) by mouth every 4 (four) hours as needed for moderate pain. Patient not taking: Reported on 06/18/2019 06/15/19   Mirna Mires, CNM    Allergies Patient has no known allergies.  Family History  Problem Relation Age of Onset  . Lupus Mother   . Hypertension Mother   . Hyperthyroidism Mother   . Hyperlipidemia Mother   .  Hypertension Maternal Grandmother   . Diabetes Maternal Grandmother   . Transient ischemic attack Maternal Grandmother   . Hyperlipidemia Maternal Grandfather   . Breast cancer Paternal Grandmother   . Transient ischemic attack Paternal Grandmother     Social History Social History   Tobacco Use  . Smoking status: Never Smoker  . Smokeless  tobacco: Never Used  Substance Use Topics  . Alcohol use: No    Alcohol/week: 0.0 standard drinks  . Drug use: No    Comment: h/o THC use    Review of Systems  Review of Systems  Constitutional: Positive for fatigue. Negative for fever.  HENT: Negative for congestion and sore throat.   Eyes: Negative for visual disturbance.  Respiratory: Negative for cough and shortness of breath.   Cardiovascular: Positive for leg swelling. Negative for chest pain.  Gastrointestinal: Negative for abdominal pain, diarrhea, nausea and vomiting.  Genitourinary: Negative for flank pain.  Musculoskeletal: Positive for back pain. Negative for neck pain.  Skin: Negative for rash and wound.  Neurological: Positive for headaches. Negative for weakness.  All other systems reviewed and are negative.    ____________________________________________  PHYSICAL EXAM:      VITAL SIGNS: ED Triage Vitals  Enc Vitals Group     BP 06/17/19 2154 (!) 156/101     Pulse Rate 06/17/19 2153 67     Resp 06/17/19 2153 18     Temp 06/17/19 2153 98.2 F (36.8 C)     Temp Source 06/17/19 2153 Oral     SpO2 06/17/19 2153 100 %     Weight 06/17/19 2153 180 lb (81.6 kg)     Height 06/17/19 2153 5\' 1"  (1.549 m)     Head Circumference --      Peak Flow --      Pain Score 06/17/19 2153 8     Pain Loc --      Pain Edu? --      Excl. in GC? --      Physical Exam Vitals and nursing note reviewed.  Constitutional:      General: She is not in acute distress.    Appearance: She is well-developed.  HENT:     Head: Normocephalic and atraumatic.  Eyes:     Conjunctiva/sclera: Conjunctivae normal.  Cardiovascular:     Rate and Rhythm: Normal rate and regular rhythm.     Heart sounds: Normal heart sounds. No murmur. No friction rub.     Comments: Trace non pitting edema b/l ankles Pulmonary:     Effort: Pulmonary effort is normal. No respiratory distress.     Breath sounds: Normal breath sounds. No wheezing or rales.    Abdominal:     General: There is no distension.     Palpations: Abdomen is soft.     Tenderness: There is no abdominal tenderness.  Musculoskeletal:     Cervical back: Neck supple.  Skin:    General: Skin is warm.     Capillary Refill: Capillary refill takes less than 2 seconds.  Neurological:     Mental Status: She is alert and oriented to person, place, and time.     GCS: GCS eye subscore is 4. GCS verbal subscore is 5. GCS motor subscore is 6.     Cranial Nerves: No cranial nerve deficit.     Sensory: No sensory deficit.     Motor: No weakness or abnormal muscle tone.  Psychiatric:        Mood and Affect: Mood  normal.       ____________________________________________   LABS (all labs ordered are listed, but only abnormal results are displayed)  Labs Reviewed  COMPREHENSIVE METABOLIC PANEL - Abnormal; Notable for the following components:      Result Value   Albumin 3.3 (*)    All other components within normal limits  CBC - Abnormal; Notable for the following components:   Hemoglobin 11.0 (*)    HCT 33.1 (*)    RDW 16.8 (*)    nRBC 0.3 (*)    All other components within normal limits  URINALYSIS, COMPLETE (UACMP) WITH MICROSCOPIC - Abnormal; Notable for the following components:   Color, Urine YELLOW (*)    APPearance CLEAR (*)    Hgb urine dipstick SMALL (*)    All other components within normal limits  LACTATE DEHYDROGENASE - Abnormal; Notable for the following components:   LDH 238 (*)    All other components within normal limits  MAGNESIUM  URIC ACID  PROTEIN / CREATININE RATIO, URINE    ____________________________________________  EKG: None ________________________________________  RADIOLOGY All imaging, including plain films, CT scans, and ultrasounds, independently reviewed by me, and interpretations confirmed via formal radiology reads.  ED MD interpretation:   NOne  Official radiology report(s): No results  found.  ____________________________________________  PROCEDURES   Procedure(s) performed (including Critical Care):  .1-3 Lead EKG Interpretation Performed by: Shaune Pollack, MD Authorized by: Shaune Pollack, MD     Interpretation: normal     ECG rate:  70-80   ECG rate assessment: normal     Rhythm: sinus rhythm     Ectopy: none     Conduction: normal   Comments:     Indication: Receiving IV antihypertensives    ____________________________________________  INITIAL IMPRESSION / MDM / ASSESSMENT AND PLAN / ED COURSE  As part of my medical decision making, I reviewed the following data within the electronic MEDICAL RECORD NUMBER Nursing notes reviewed and incorporated, Old chart reviewed, Notes from prior ED visits, and [place] Controlled Substance Database       *AIRI COPADO was evaluated in Emergency Department on 06/18/2019 for the symptoms described in the history of present illness. She was evaluated in the context of the global COVID-19 pandemic, which necessitated consideration that the patient might be at risk for infection with the SARS-CoV-2 virus that causes COVID-19. Institutional protocols and algorithms that pertain to the evaluation of patients at risk for COVID-19 are in a state of rapid change based on information released by regulatory bodies including the CDC and federal and state organizations. These policies and algorithms were followed during the patient's care in the ED.  Some ED evaluations and interventions may be delayed as a result of limited staffing during the pandemic.*     Medical Decision Making:  24 yo F s/p C-Section 5/10 here with back pain, headache, leg edema. Re: back pain - suspect this is 2/2 her epidural. No LE weakness, numbness, loss of bowel/bladder, or signs to suggest epidural hematoma, spinal cord injury, or abscess. No signs of infection. Percocet given.  Primary concern is significant HTN, with SBP 150-160 and DBP 95-110. H/o  preEclampsia.Labs show normal plt, LFTs, and no proteinuria but c/f post-partum HTN with possible early preeclampsia. Labetalol, hydral given and will admit to OB.   ____________________________________________  FINAL CLINICAL IMPRESSION(S) / ED DIAGNOSES  Final diagnoses:  Preeclampsia in postpartum period     MEDICATIONS GIVEN DURING THIS VISIT:  Medications  enoxaparin (LOVENOX) injection 40  mg (40 mg Subcutaneous Given 06/18/19 1106)  labetalol (NORMODYNE) injection 20 mg (has no administration in time range)    And  labetalol (NORMODYNE) injection 40 mg (has no administration in time range)    And  labetalol (NORMODYNE) injection 80 mg (has no administration in time range)    And  hydrALAZINE (APRESOLINE) injection 10 mg (has no administration in time range)  magnesium sulfate 40 grams in SWI 1000 mL OB infusion (2 g/hr Intravenous Rate/Dose Verify 06/18/19 1200)  calcium gluconate 10 % injection (has no administration in time range)  oxyCODONE-acetaminophen (PERCOCET/ROXICET) 5-325 MG per tablet 1-2 tablet (has no administration in time range)  ibuprofen (ADVIL) tablet 600 mg (600 mg Oral Given 06/18/19 1111)  lactated ringers infusion ( Intravenous Rate/Dose Verify 06/18/19 1200)  oxyCODONE-acetaminophen (PERCOCET/ROXICET) 5-325 MG per tablet 2 tablet (2 tablets Oral Given 06/18/19 0402)  labetalol (NORMODYNE) injection 10 mg (10 mg Intravenous Given 06/18/19 0505)  hydrALAZINE (APRESOLINE) injection 2 mg (2 mg Intravenous Given 06/18/19 0643)  magnesium bolus via infusion 4 g (4 g Intravenous Bolus from Bag 06/18/19 0807)     ED Discharge Orders    None       Note:  This document was prepared using Dragon voice recognition software and may include unintentional dictation errors.   Duffy Bruce, MD 06/18/19 1341    Duffy Bruce, MD 06/18/19 [phone]

## 2019-06-19 ENCOUNTER — Other Ambulatory Visit: Payer: Self-pay | Admitting: Obstetrics and Gynecology

## 2019-06-19 DIAGNOSIS — O1415 Severe pre-eclampsia, complicating the puerperium: Secondary | ICD-10-CM

## 2019-06-19 MED ORDER — BREAST MILK/FORMULA (FOR LABEL PRINTING ONLY): ORAL | Status: DC

## 2019-06-19 MED ORDER — LABETALOL HCL 100 MG PO TABS: 100.0000 mg | ORAL_TABLET | Freq: Two times a day (BID) | ORAL | Status: DC

## 2019-06-19 MED ORDER — LABETALOL HCL 100 MG PO TABS: 100.0000 mg | ORAL_TABLET | Freq: Two times a day (BID) | ORAL | 0 refills | Status: DC

## 2019-06-19 MED ORDER — OXYCODONE-ACETAMINOPHEN 5-325 MG PO TABS: 1.0000 | ORAL_TABLET | ORAL | 0 refills | Status: DC | PRN

## 2019-06-19 MED ORDER — LABETALOL HCL 100 MG PO TABS
ORAL_TABLET | ORAL | Status: AC
  Administered 2019-06-19: 100 mg via ORAL
  Filled 2019-06-19: qty 1

## 2019-06-19 NOTE — Discharge Instructions (Signed)
Preeclampsia and Eclampsia Preeclampsia is a serious condition that may develop during pregnancy. This condition causes high blood pressure and increased protein in your urine along with other symptoms, such as headaches and vision changes. These symptoms may develop as the condition gets worse. Preeclampsia may occur at 20 weeks of pregnancy or later. Diagnosing and treating preeclampsia early is very important. If not treated early, it can cause serious problems for you and your baby. One problem it can lead to is eclampsia. Eclampsia is a condition that causes muscle jerking or shaking (convulsions or seizures) and other serious problems for the mother. During pregnancy, delivering your baby may be the best treatment for preeclampsia or eclampsia. For most women, preeclampsia and eclampsia symptoms go away after giving birth. In rare cases, a woman may develop preeclampsia after giving birth (postpartum preeclampsia). This usually occurs within 48 hours after childbirth but may occur up to 6 weeks after giving birth. What are the causes? The cause of preeclampsia is not known. What increases the risk? The following risk factors make you more likely to develop preeclampsia:  Being pregnant for the first time.  Having had preeclampsia during a past pregnancy.  Having a family history of preeclampsia.  Having high blood pressure.  Being pregnant with more than one baby.  Being 35 or older.  Being African-American.  Having kidney disease or diabetes.  Having medical conditions such as lupus or blood diseases.  Being very overweight (obese). What are the signs or symptoms? The most common symptoms are:  Severe headaches.  Vision problems, such as blurred or double vision.  Abdominal pain, especially upper abdominal pain. Other symptoms that may develop as the condition gets worse include:  Sudden weight gain.  Sudden swelling of the hands, face, legs, and feet.  Severe nausea  and vomiting.  Numbness in the face, arms, legs, and feet.  Dizziness.  Urinating less than usual.  Slurred speech.  Convulsions or seizures. How is this diagnosed? There are no screening tests for preeclampsia. Your health care provider will ask you about symptoms and check for signs of preeclampsia during your prenatal visits. You may also have tests that include:  Checking your blood pressure.  Urine tests to check for protein. Your health care provider will check for this at every prenatal visit.  Blood tests.  Monitoring your baby's heart rate.  Ultrasound. How is this treated? You and your health care provider will determine the treatment approach that is best for you. Treatment may include:  Having more frequent prenatal exams to check for signs of preeclampsia, if you have an increased risk for preeclampsia.  Medicine to lower your blood pressure.  Staying in the hospital, if your condition is severe. There, treatment will focus on controlling your blood pressure and the amount of fluids in your body (fluid retention).  Taking medicine (magnesium sulfate) to prevent seizures. This may be given as an injection or through an IV.  Taking a low-dose aspirin during your pregnancy.  Delivering your baby early. You may have your labor started with medicine (induced), or you may have a cesarean delivery. Follow these instructions at home: Eating and drinking   Drink enough fluid to keep your urine pale yellow.  Avoid caffeine. Lifestyle  Do not use any products that contain nicotine or tobacco, such as cigarettes and e-cigarettes. If you need help quitting, ask your health care provider.  Do not use alcohol or drugs.  Avoid stress as much as possible. Rest and get   plenty of sleep. General instructions  Take over-the-counter and prescription medicines only as told by your health care provider.  When lying down, lie on your left side. This keeps pressure off your  major blood vessels.  When sitting or lying down, raise (elevate) your feet. Try putting some pillows underneath your lower legs.  Exercise regularly. Ask your health care provider what kinds of exercise are best for you.  Keep all follow-up and prenatal visits as told by your health care provider. This is important. How is this prevented? There is no known way of preventing preeclampsia or eclampsia from developing. However, to lower your risk of complications and detect problems early:  Get regular prenatal care. Your health care provider may be able to diagnose and treat the condition early.  Maintain a healthy weight. Ask your health care provider for help managing weight gain during pregnancy.  Work with your health care provider to manage any long-term (chronic) health conditions you have, such as diabetes or kidney problems.  You may have tests of your blood pressure and kidney function after giving birth.  Your health care provider may have you take low-dose aspirin during your next pregnancy. Contact a health care provider if:  You have symptoms that your health care provider told you may require more treatment or monitoring, such as: ? Headaches. ? Nausea or vomiting. ? Abdominal pain. ? Dizziness. ? Light-headedness. Get help right away if:  You have severe: ? Abdominal pain. ? Headaches that do not get better. ? Dizziness. ? Vision problems. ? Confusion. ? Nausea or vomiting.  You have any of the following: ? A seizure. ? Sudden, rapid weight gain. ? Sudden swelling in your hands, ankles, or face. ? Trouble moving any part of your body. ? Numbness in any part of your body. ? Trouble speaking. ? Abnormal bleeding.  You faint. Summary  Preeclampsia is a serious condition that may develop during pregnancy.  This condition causes high blood pressure and increased protein in your urine along with other symptoms, such as headaches and vision  changes.  Diagnosing and treating preeclampsia early is very important. If not treated early, it can cause serious problems for you and your baby.  Get help right away if you have symptoms that your health care provider told you to watch for. This information is not intended to replace advice given to you by your health care provider. Make sure you discuss any questions you have with your health care provider. Document Revised: 09/22/2017 Document Reviewed: 08/27/2015 Elsevier Patient Education  2020 [employer]  

## 2019-06-19 NOTE — Progress Notes (Signed)
Subjective:     Objective:  Vital signs in last 24 hours: Temp:  [97.9 F (36.6 C)-98.3 F (36.8 C)] 97.9 F (36.6 C) (05/16 0704) Pulse Rate:  [66-94] 88 (05/16 0800) Resp:  [15-20] 16 (05/16 0704) BP: (118-143)/(71-95) 131/84 (05/16 0800) SpO2:  [95 %-100 %] 99 % (05/16 0800)    Intake/Output      05/15 0701 - 05/16 0700 05/16 0701 - 05/17 0700   P.O. 1135 0   I.V. (mL/kg) 2778.9 (34.1) 129.1 (1.6)   Total Intake(mL/kg) 3913.9 (48) 129.1 (1.6)   Urine (mL/kg/hr) 4890 (2.5) 550 (5.3)   Total Output 4890 550   Net -976.1 -420.9          General: NAD Pulmonary: no increased work of breathing Abdomen: non-distended, non-tender, fundus firm at level of umbilicus Incision: Extremities: no edema, no erythema, no tenderness  Results for orders placed or performed during the hospital encounter of 06/18/19 (from the past 72 hour(s))  Comprehensive metabolic panel     Status: Abnormal   Collection Time: 06/17/19  9:56 PM  Result Value Ref Range   Sodium 136 135 - 145 mmol/L   Potassium 3.5 3.5 - 5.1 mmol/L   Chloride 104 98 - 111 mmol/L   CO2 24 22 - 32 mmol/L   Glucose, Bld 86 70 - 99 mg/dL    Comment: Glucose reference range applies only to samples taken after fasting for at least 8 hours.   BUN 8 6 - 20 mg/dL   Creatinine, Ser 0.63 0.44 - 1.00 mg/dL   Calcium 9.3 8.9 - 10.3 mg/dL   Total Protein 7.3 6.5 - 8.1 g/dL   Albumin 3.3 (L) 3.5 - 5.0 g/dL   AST 32 15 - 41 U/L   ALT 38 0 - 44 U/L   Alkaline Phosphatase 106 38 - 126 U/L   Total Bilirubin 0.7 0.3 - 1.2 mg/dL   GFR calc non Af Amer >60 >60 mL/min   GFR calc Af Amer >60 >60 mL/min   Anion gap 8 5 - 15    Comment: Performed at [facility], [address] [place], [place] [address]  CBC     Status: Abnormal   Collection Time: 06/17/19  9:56 PM  Result Value Ref Range   WBC 7.2 4.0 - 10.5 K/uL   RBC 3.94 3.87 - 5.11 MIL/uL   Hemoglobin 11.0 (L) 12.0 - 15.0 g/dL   HCT 33.1 (L) 36.0 - 46.0 %   MCV 84.0 80.0 - 100.0 fL   MCH 27.9 26.0 - 34.0 pg   MCHC 33.2 30.0 - 36.0 g/dL   RDW 16.8 (H) 11.5 - 15.5 %   Platelets 276 150 - 400 K/uL   nRBC 0.3 (H) 0.0 - 0.2 %    Comment: Performed at [facility], [address] [place], [place] [address]  Magnesium     Status: None   Collection Time: 06/17/19  9:56 PM  Result Value Ref Range   Magnesium 1.8 1.7 - 2.4 mg/dL    Comment: Performed at [facility], [address] [place], [place] [address]  Lactate dehydrogenase     Status: Abnormal   Collection Time: 06/17/19  9:56 PM  Result Value Ref Range   LDH 238 (H) 98 - 192 U/L    Comment: Performed at [facility], [address], [place], [place] [address]  Uric acid     Status: None   Collection Time: 06/17/19  9:56 PM  Result Value Ref Range   Uric Acid, Serum 6.5 2.5 - 7.1 mg/dL    Comment: Performed at [facility], [address] [place], [place] [address]  Urinalysis, Complete w Microscopic     Status: Abnormal   Collection Time: 06/18/19  3:54 AM  Result Value Ref Range   Color, Urine YELLOW (A) YELLOW   APPearance CLEAR (A) CLEAR   Specific Gravity, Urine 1.020 1.005 - 1.030   pH 7.0 5.0 - 8.0   Glucose, UA NEGATIVE NEGATIVE mg/dL   Hgb urine dipstick SMALL (A) NEGATIVE   Bilirubin Urine NEGATIVE NEGATIVE   Ketones, ur NEGATIVE NEGATIVE mg/dL   Protein, ur NEGATIVE NEGATIVE mg/dL   Nitrite NEGATIVE NEGATIVE   Leukocytes,Ua NEGATIVE NEGATIVE   RBC / HPF 0-5 0 - 5 RBC/hpf   WBC, UA NONE SEEN 0 - 5 WBC/hpf   Bacteria, UA NONE SEEN NONE SEEN   Squamous Epithelial / LPF 0-5 0 - 5   Mucus PRESENT     Comment: Performed at [facility], [address] [place], [place] [address]  Protein / creatinine ratio, urine     Status: None   Collection Time: 06/18/19  3:54 AM  Result Value Ref Range   Creatinine, Urine 127 mg/dL   Total Protein, Urine 13 mg/dL    Comment: NO NORMAL RANGE ESTABLISHED FOR THIS TEST   Protein  Creatinine Ratio 0.10 0.00 - 0.15 mg/mg[Cre]    Comment: Performed at [facility], [address] [place], [place] [address]  Magnesium     Status: Abnormal   Collection Time: 06/18/19  2:35 PM  Result Value Ref Range   Magnesium 5.1 (H) 1.7 - 2.4 mg/dL    Comment: Performed at [facility], [address], [place], [place] [address]    Immunization History  Administered Date(s) Administered  . Tdap 03/22/2015, 04/29/2019    Assessment:   24 y.o. [id number] postoperativeday # 6 RLTCS and HD#2 re-admission of preeclampsia with severe features by blood pressure criteria and neurological symptoms (headache)   Plan:  1) Preeclampsia with severe features  - completed 24-hr course of magensium sulfate this morning - has not required any additional IV BP treatment beyond the 10mg  of IV labetalol and 2mg  of IV hydralazine in the ER - Labs normal on admission - Will start patient on 100mg  labetalol po bid  2) Blood Type --/--/A POS (05/09 2339) / Rubella 2.66 (10/19 1136) / Varicella Immune  3) TDAP status up to date  4) Feeding plan breast - bump at bedside  5) DVT ppx - Lovenox  6) Pain control postop - ibuprofen and percocet - patient reports was unable to obtain her percocet Rx from pharmacy because the provider was not in the medicaid system  7) Disposition - anticipate discharge later this afternoon if BP remains stable  05-07-1990, MD, [address] OB/[facility]N, [facility] 06/19/2019, 8:18 AM

## 2019-06-19 NOTE — Discharge Summary (Signed)
Physician Discharge Summary  Patient ID: Jessica Randall MRN: [id number] DOB/AGE: [birth year] 23 y.o.  Admit date: 06/18/2019 Discharge date: 06/19/2019  Admission Diagnoses: Preeclampsia with severe features postpartum  Discharge Diagnoses:  Principal Problem:   Hypertension in pregnancy, preeclampsia, severe, delivered/postpartum Active Problems:   Preeclampsia in postpartum period   Discharged Condition: good  Hospital Course: 24 y.o. [id number] admitted POD 5 following RLTCS with severe range BP and headache.  Laboratory evaluation normal but started on magnesium sulfate 24-hrs for preeclampsia with severe features by BP and neurologic criteria.  She received 10mg  of labetalol IV followed by 2mg  of hydralazine in the ED.  She did not require treatment for any additional severe range blood pressures.  She was started on labetalol 100mg  po bid prior to discharge.  Continued to have mild intermittent headache responsive to percocet.  No nausea or emesis, tolerating po.  Consults: None  Significant Diagnostic Studies:  Results for orders placed or performed during the hospital encounter of 06/18/19 (from the past 24 hour(s))  Magnesium     Status: Abnormal   Collection Time: 06/18/19  2:35 PM  Result Value Ref Range   Magnesium 5.1 (H) 1.7 - 2.4 mg/dL     Treatments: magnesium sulfate  Discharge Exam: Blood pressure 129/77, pulse 81, temperature 97.9 F (36.6 C), temperature source Oral, resp. rate 16, height 5\' 1"  (1.549 m), weight 81.6 kg, last menstrual period 09/10/2018, SpO2 100 %, unknown if currently breastfeeding. General appearance: alert, appears stated age and no distress Resp: no increased work of breathing GI: soft, non-tender, non-distended, incision D/C/I Extremities: extremities normal, atraumatic, no cyanosis or edema  Disposition: Discharge disposition: 01-Home or Self Care       Discharge Instructions    Call MD for:   Complete by: As directed    Heavy  vaginal bleeding greater than 1 pad an hour   Call MD for:  difficulty breathing, headache or visual disturbances   Complete by: As directed    Call MD for:  extreme fatigue   Complete by: As directed    Call MD for:  hives   Complete by: As directed    Call MD for:  persistant dizziness or light-headedness   Complete by: As directed    Call MD for:  persistant nausea and vomiting   Complete by: As directed    Call MD for:  redness, tenderness, or signs of infection (pain, swelling, redness, odor or green/yellow discharge around incision site)   Complete by: As directed    Call MD for:  severe uncontrolled pain   Complete by: As directed    Call MD for:  temperature >100.4   Complete by: As directed    Diet general   Complete by: As directed    Discharge wound care:   Complete by: As directed    You may apply a light dressing for minor discharge from the incision or to keep waistbands of clothing from rubbing.  You may also have been discharge with a clear dressing in which case this will be removed at your postoperative clinic visit.  You may shower, use soap on your incision.  Avoid baths or soaking the incision in the first 6 weeks following your surgery..   Driving restriction   Complete by: As directed    Avoid driving for at least 2 weeks or while taking prescription narcotics.   Lifting restrictions   Complete by: As directed    Weight restriction of 10lbs for 6 weeks.  Allergies as of 06/19/2019   No Known Allergies     Medication List    STOP taking these medications   oxyCODONE 5 MG immediate release tablet Commonly known as: Oxy IR/ROXICODONE     TAKE these medications   enoxaparin 40 MG/0.4ML injection Commonly known as: LOVENOX Inject 0.4 mLs (40 mg total) into the skin daily for 21 doses.   ibuprofen 800 MG tablet Commonly known as: ADVIL Take 1 tablet (800 mg total) by mouth every 8 (eight) hours.   labetalol 100 MG tablet Commonly known as:  NORMODYNE Take 1 tablet (100 mg total) by mouth 2 (two) times daily.   oxyCODONE-acetaminophen 5-325 MG tablet Commonly known as: PERCOCET/ROXICET Take 1 tablet by mouth every 4 (four) hours as needed for severe pain.            Discharge Care Instructions  (From admission, onward)         Start     Ordered   06/19/19 0000  Discharge wound care:    Comments: You may apply a light dressing for minor discharge from the incision or to keep waistbands of clothing from rubbing.  You may also have been discharge with a clear dressing in which case this will be removed at your postoperative clinic visit.  You may shower, use soap on your incision.  Avoid baths or soaking the incision in the first 6 weeks following your surgery.Marland Kitchen   06/19/19 1332           Signed: Malachy Mood 06/19/2019, 1:33 PM

## 2019-06-19 NOTE — Care Plan (Signed)
Pt. Awoke with c/o of a H/A.  Rated it 6/10.  Administered (1) tablet of oxycodone per provider orders

## 2019-06-23 ENCOUNTER — Ambulatory Visit (INDEPENDENT_AMBULATORY_CARE_PROVIDER_SITE_OTHER): Payer: Medicaid Other | Admitting: Obstetrics and Gynecology

## 2019-06-23 ENCOUNTER — Other Ambulatory Visit: Payer: Self-pay | Admitting: Obstetrics and Gynecology

## 2019-06-23 ENCOUNTER — Encounter: Payer: Self-pay | Admitting: Obstetrics and Gynecology

## 2019-06-23 ENCOUNTER — Other Ambulatory Visit: Payer: Self-pay

## 2019-06-23 VITALS — BP 118/80 | Ht 61.0 in | Wt 170.0 lb

## 2019-06-23 DIAGNOSIS — Z98891 History of uterine scar from previous surgery: Secondary | ICD-10-CM

## 2019-06-23 DIAGNOSIS — O0990 Supervision of high risk pregnancy, unspecified, unspecified trimester: Secondary | ICD-10-CM

## 2019-06-23 DIAGNOSIS — Z87898 Personal history of other specified conditions: Secondary | ICD-10-CM

## 2019-06-23 MED ORDER — ENOXAPARIN SODIUM 40 MG/0.4ML ~~LOC~~ SOLN: 40.0000 mg | SUBCUTANEOUS | 0 refills | Status: DC

## 2019-06-23 NOTE — Progress Notes (Signed)
  OBSTETRICS POSTPARTUM CLINIC PROGRESS NOTE  Subjective:     Jessica Randall is a 24 y.o. [phone] female who presents for a postpartum visit. She is 1 week postpartum following a Term pregnancy and delivery by C-section repeat; no problems after deliver.  I have fully reviewed the prenatal and intrapartum course. Anesthesia: spinal.  Postpartum course has been complicated by complicated by hypertension.  Baby is feeding by Breast.  Bleeding: patient has not  resumed menses.  Bowel function is normal. Bladder function is normal.  Patient is not sexually active. Contraception method desired is IUD.  Postpartum depression screening: negative.  The following portions of the patient's history were reviewed and updated as appropriate: allergies, current medications, past family history, past medical history, past social history, past surgical history and problem list.  Review of Systems Pertinent items are noted in HPI.  Objective:    BP 118/80   Ht 5\' 1"  (1.549 m)   Wt 170 lb (77.1 kg)   Breastfeeding Yes   BMI 32.12 kg/m   General:  alert and no distress   Breasts:  inspection negative, no nipple discharge or bleeding, no masses or nodularity palpable  Lungs: clear to auscultation bilaterally  Heart:  regular rate and rhythm, S1, S2 normal, no murmur, click, rub or gallop  Abdomen: soft, non-tender; bowel sounds normal; no masses,  no organomegaly.   Well healed Pfannenstiel incision   Vulva:  normal  Vagina: normal vagina, no discharge, exudate, lesion, or erythema  Cervix:  no cervical motion tenderness and no lesions  Corpus: normal size, contour, position, consistency, mobility, non-tender  Adnexa:  normal adnexa and no mass, fullness, tenderness  Rectal Exam: Not performed.          Assessment:  Post Partum Care visit 1. Supervision of high risk pregnancy, antepartum   - enoxaparin (LOVENOX) 40 MG/0.4ML injection; Inject 0.4 mLs (40 mg total) into the skin daily for 21  days.  Dispense: 8.4 mL; Refill: 0  2. S/P cesarean section   - enoxaparin (LOVENOX) 40 MG/0.4ML injection; Inject 0.4 mLs (40 mg total) into the skin daily for 21 days.  Dispense: 8.4 mL; Refill: 0   Plan:  See orders and Patient Instructions Follow up in: 2 weeks or as needed.   MD, Adelene Idler OB/[facility]N, [facility] 06/23/2019 12:18 PM

## 2019-07-06 ENCOUNTER — Telehealth: Payer: Self-pay | Admitting: Obstetrics and Gynecology

## 2019-07-06 ENCOUNTER — Other Ambulatory Visit: Payer: Self-pay | Admitting: Obstetrics and Gynecology

## 2019-07-06 DIAGNOSIS — Z3009 Encounter for other general counseling and advice on contraception: Secondary | ICD-10-CM

## 2019-07-06 DIAGNOSIS — Z789 Other specified health status: Secondary | ICD-10-CM

## 2019-07-06 MED ORDER — MEDROXYPROGESTERONE ACETATE 150 MG/ML IM SUSP: 150.0000 mg | INTRAMUSCULAR | 3 refills | Status: DC

## 2019-07-06 NOTE — Telephone Encounter (Signed)
Sext rx, patient can schedule injection visit

## 2019-07-06 NOTE — Telephone Encounter (Signed)
Patient is calling wanting to be put on the depo provera for birth control. Patient is wanting to schedule for the nurse visit. Please advise. Patient is scheduled for 07/28/19 at 10 am for 6 weeks PP check with [facility]

## 2019-07-07 NOTE — Telephone Encounter (Signed)
Patient is schedule 07/08/19

## 2019-07-07 NOTE — Telephone Encounter (Signed)
Called and left voicemail for patient to call back to be scheduled for nurse visit

## 2019-07-08 ENCOUNTER — Ambulatory Visit: Payer: Medicaid Other

## 2019-07-11 ENCOUNTER — Ambulatory Visit (INDEPENDENT_AMBULATORY_CARE_PROVIDER_SITE_OTHER): Payer: Medicaid Other

## 2019-07-11 ENCOUNTER — Other Ambulatory Visit: Payer: Self-pay

## 2019-07-11 DIAGNOSIS — Z3042 Encounter for surveillance of injectable contraceptive: Secondary | ICD-10-CM | POA: Diagnosis not present

## 2019-07-11 LAB — POCT URINE PREGNANCY: Preg Test, Ur: NEGATIVE

## 2019-07-11 MED ORDER — MEDROXYPROGESTERONE ACETATE 150 MG/ML IM SUSP
150.0000 mg | Freq: Once | INTRAMUSCULAR | Status: AC
  Administered 2019-07-11: 150 mg via INTRAMUSCULAR

## 2019-07-11 NOTE — Patient Instructions (Signed)
Pt here for Depo Provera. Pt requests urine pregnancy test be done. It was done and negative. Pt tolerated injection well

## 2019-07-28 ENCOUNTER — Ambulatory Visit: Payer: Medicaid Other | Admitting: Obstetrics and Gynecology

## 2019-08-09 ENCOUNTER — Other Ambulatory Visit: Payer: Self-pay

## 2019-08-09 ENCOUNTER — Emergency Department: Payer: Medicaid Other

## 2019-08-09 ENCOUNTER — Emergency Department
Admission: EM | Admit: 2019-08-09 | Discharge: 2019-08-09 | Disposition: A | Discharge status: Home / Self Care | Payer: Medicaid Other | Attending: Emergency Medicine | Admitting: Emergency Medicine

## 2019-08-09 DIAGNOSIS — S93402A Sprain of unspecified ligament of left ankle, initial encounter: Secondary | ICD-10-CM | POA: Diagnosis not present

## 2019-08-09 DIAGNOSIS — Y9289 Other specified places as the place of occurrence of the external cause: Secondary | ICD-10-CM | POA: Diagnosis not present

## 2019-08-09 DIAGNOSIS — Y998 Other external cause status: Secondary | ICD-10-CM | POA: Diagnosis not present

## 2019-08-09 DIAGNOSIS — S99912A Unspecified injury of left ankle, initial encounter: Secondary | ICD-10-CM | POA: Diagnosis present

## 2019-08-09 DIAGNOSIS — Y9389 Activity, other specified: Secondary | ICD-10-CM | POA: Insufficient documentation

## 2019-08-09 MED ORDER — KETOROLAC TROMETHAMINE 30 MG/ML IJ SOLN
30.0000 mg | Freq: Once | INTRAMUSCULAR | Status: AC
  Administered 2019-08-09: 30 mg via INTRAMUSCULAR
  Filled 2019-08-09: qty 1

## 2019-08-09 MED ORDER — KETOROLAC TROMETHAMINE 10 MG PO TABS: 10.0000 mg | ORAL_TABLET | Freq: Three times a day (TID) | ORAL | 0 refills | Status: DC

## 2019-08-09 MED ORDER — HYDROCODONE-ACETAMINOPHEN 5-325 MG PO TABS: 1.0000 | ORAL_TABLET | Freq: Three times a day (TID) | ORAL | 0 refills | Status: AC | PRN

## 2019-08-09 MED ORDER — OXYCODONE-ACETAMINOPHEN 5-325 MG PO TABS
1.0000 | ORAL_TABLET | Freq: Once | ORAL | Status: AC
  Administered 2019-08-09: 1 via ORAL
  Filled 2019-08-09: qty 1
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
## 2019-08-09 NOTE — Discharge Instructions (Signed)
Your exam and XR reveals a grade II ankle sprain without bone fracture or dislocation. You will be placed in a walking  boot and given crutches to walk. You may bear weight as you can tolerate. Rest with the foot elevated and apply ice to reduce swelling. Take the pain medicine as well as ibuprofen as directed.
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
## 2019-08-19 ENCOUNTER — Ambulatory Visit: Payer: Medicaid Other | Admitting: Obstetrics and Gynecology

## 2019-09-12 IMAGING — CR DG CHEST 2V
1 series · 2 of 2 positions shown · non-contrast
Comparison: Chest radiograph March 28, 2011.

CLINICAL DATA: Chest pain since yesterday. Rib pain with fetal
motion.

EXAM:
CHEST - 2 VIEW

[Series 1: dg chest 2 view · 0.14mm/px · 2 of 2 slices shown]
[im 1/2]
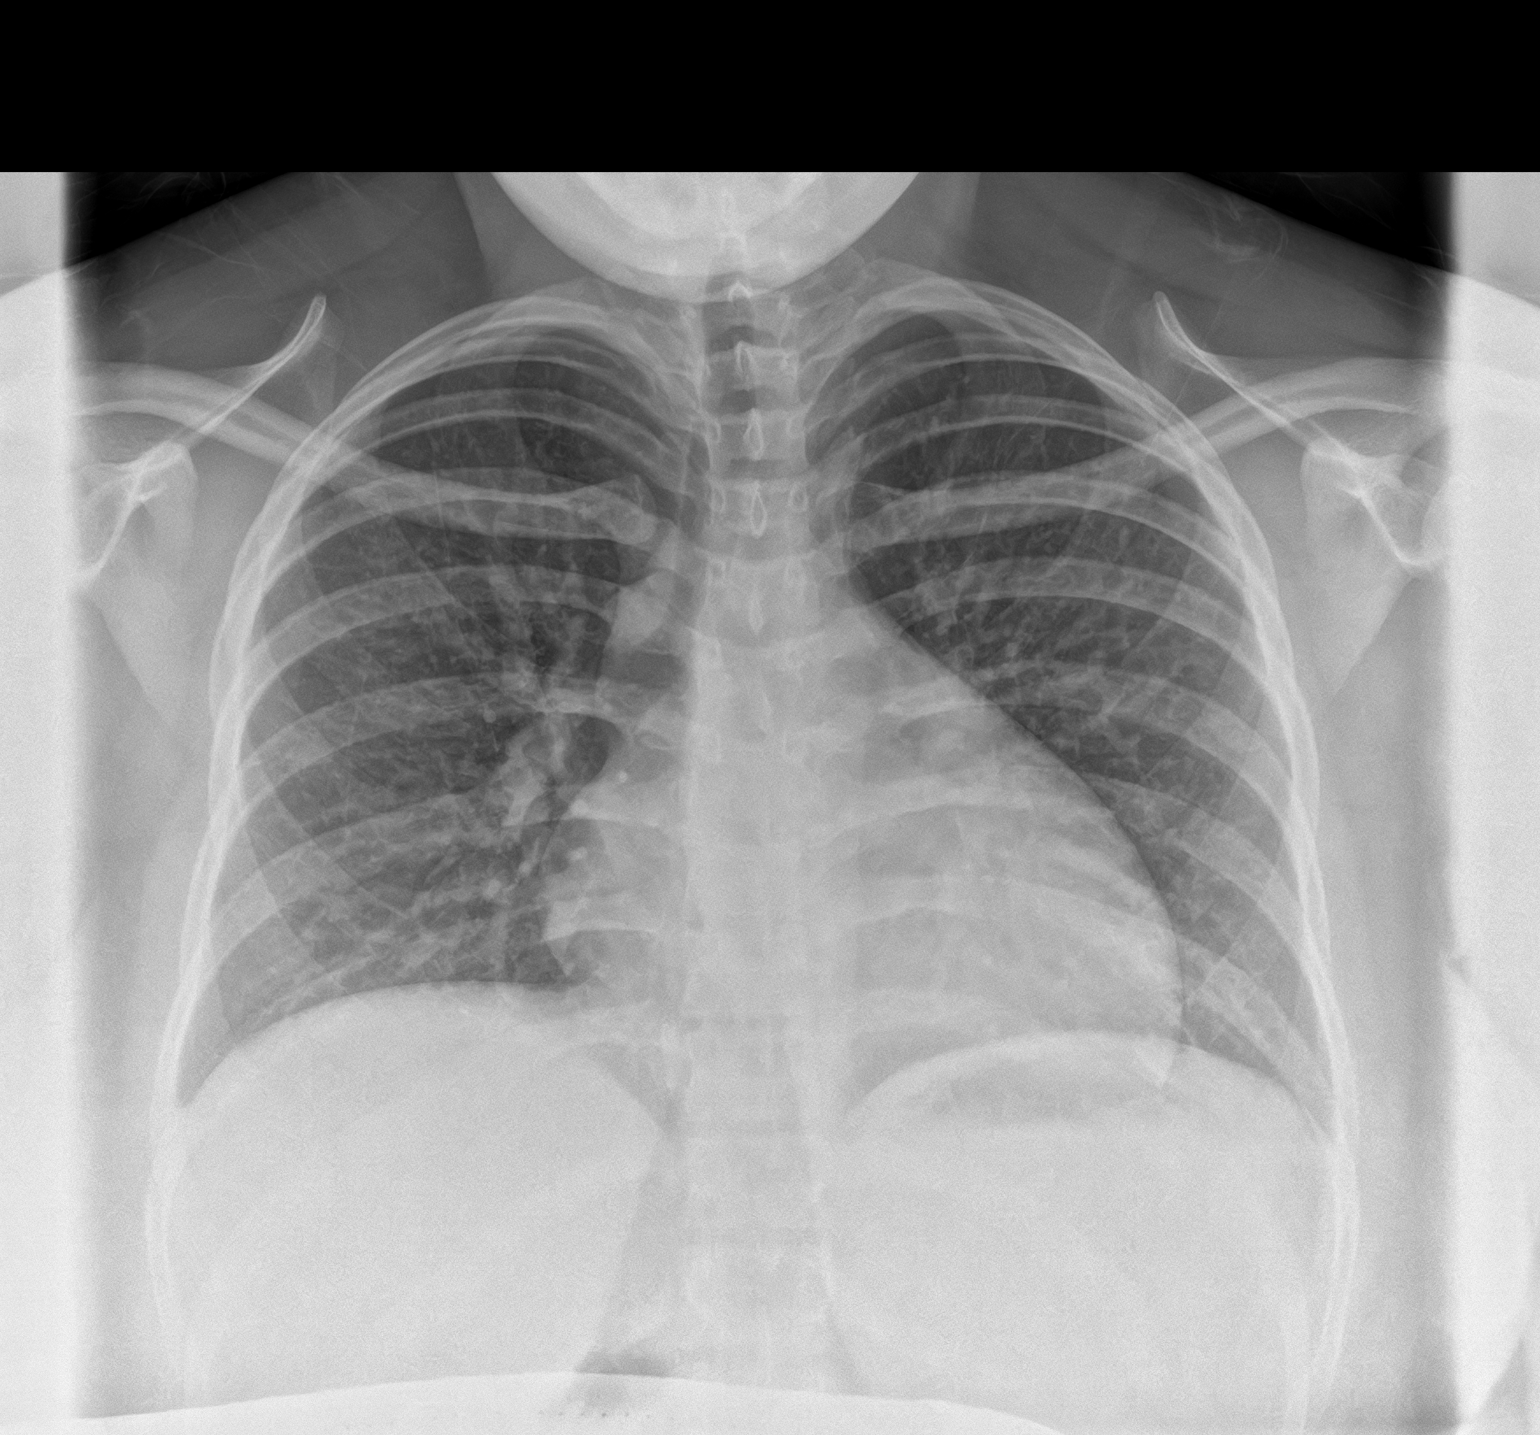
[im 2/2]
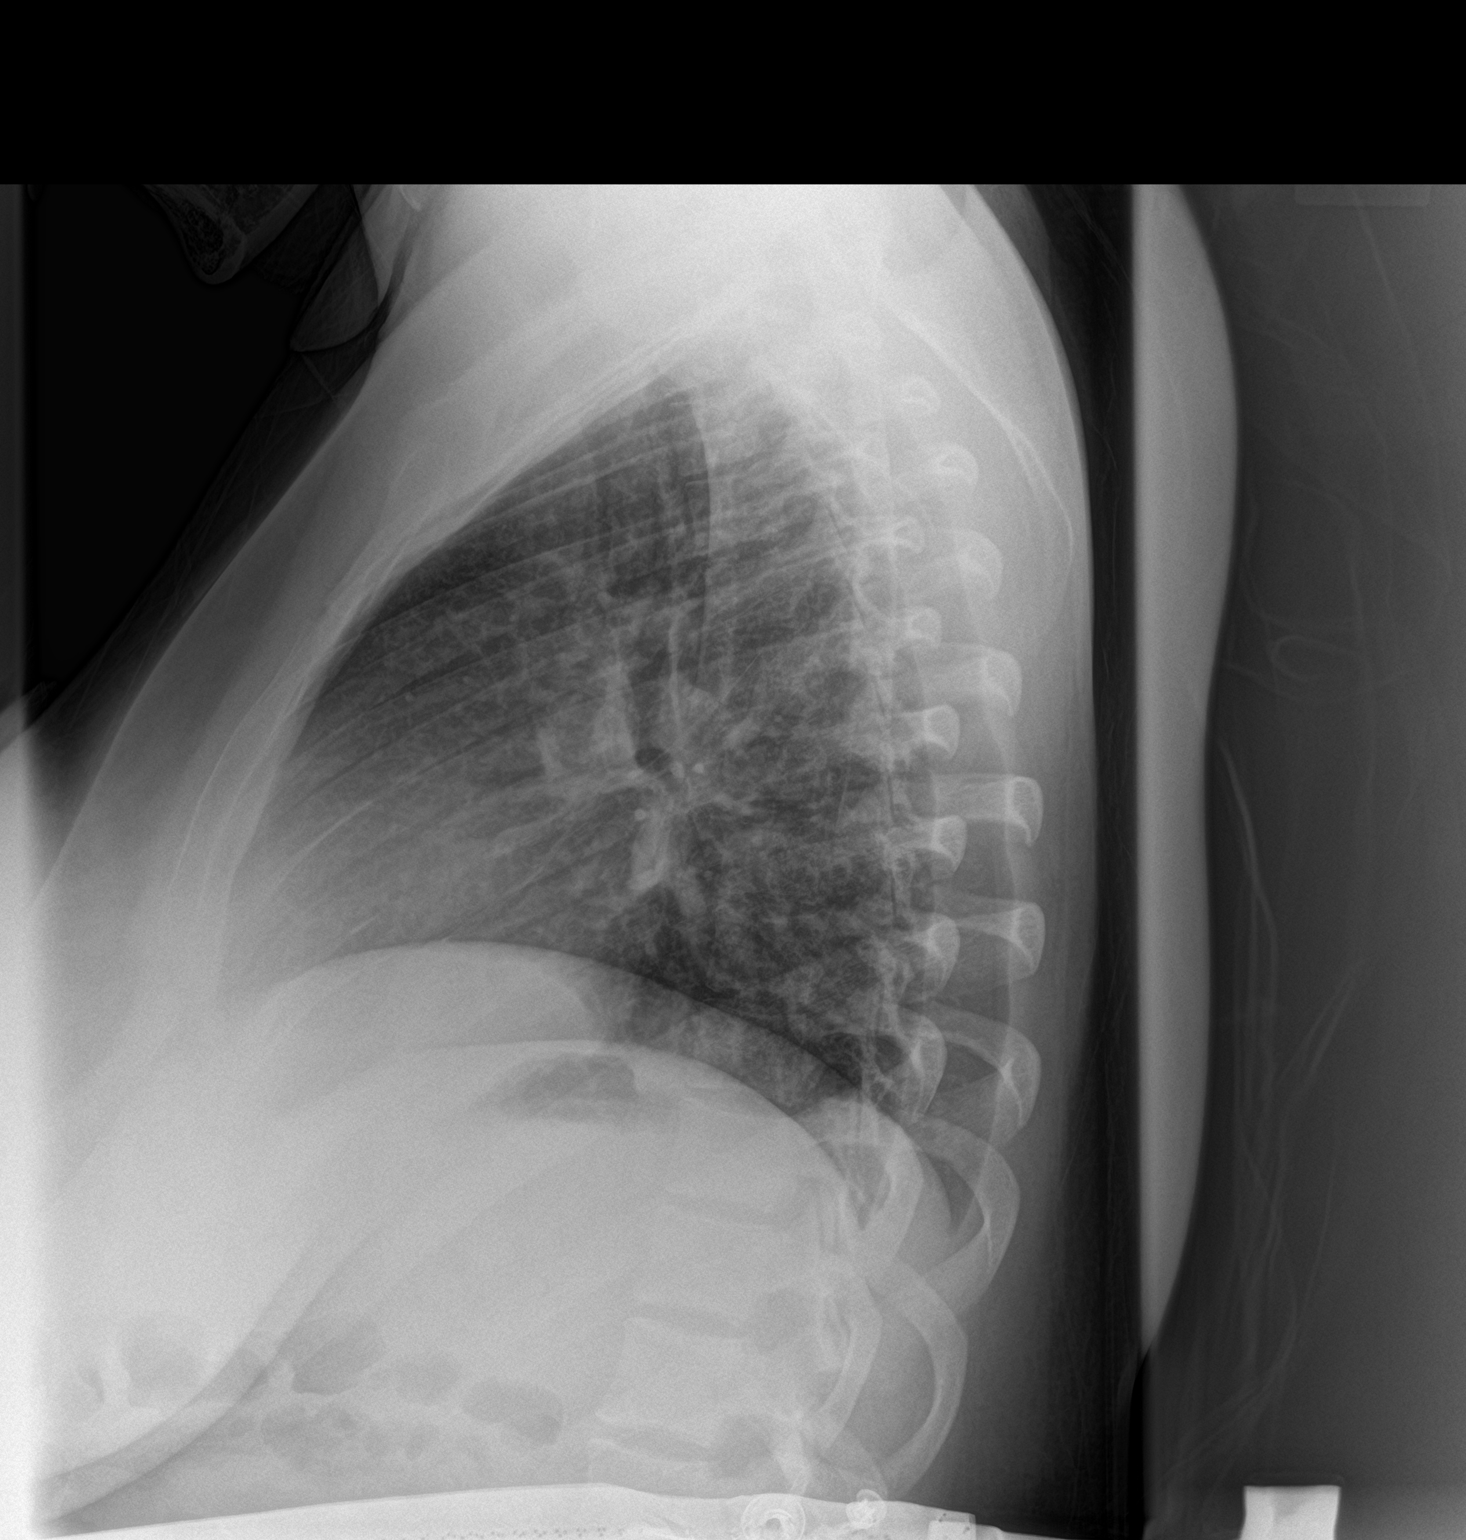

[2 of 2 positions shown; findings below may reference images not displayed]

FINDINGS: Cardiomediastinal silhouette is normal. No pleural effusions or
focal consolidations. Mild pulmonary vascular congestion. Trachea
projects midline and there is no pneumothorax. Soft tissue planes
and included osseous structures are non-suspicious.
IMPRESSION: Mild pulmonary vascular congestion.

## 2019-10-12 ENCOUNTER — Ambulatory Visit: Payer: Self-pay

## 2020-03-13 ENCOUNTER — Encounter: Payer: Self-pay | Admitting: Emergency Medicine

## 2020-03-13 ENCOUNTER — Other Ambulatory Visit: Payer: Self-pay

## 2020-03-13 DIAGNOSIS — Z79899 Other long term (current) drug therapy: Secondary | ICD-10-CM | POA: Diagnosis not present

## 2020-03-13 DIAGNOSIS — M545 Low back pain, unspecified: Secondary | ICD-10-CM | POA: Diagnosis not present

## 2020-03-13 DIAGNOSIS — Z20822 Contact with and (suspected) exposure to covid-19: Secondary | ICD-10-CM | POA: Insufficient documentation

## 2020-03-13 DIAGNOSIS — R070 Pain in throat: Secondary | ICD-10-CM | POA: Diagnosis present

## 2020-03-13 DIAGNOSIS — J029 Acute pharyngitis, unspecified: Secondary | ICD-10-CM | POA: Insufficient documentation

## 2020-03-13 DIAGNOSIS — I10 Essential (primary) hypertension: Secondary | ICD-10-CM | POA: Diagnosis not present

## 2020-03-13 DIAGNOSIS — R109 Unspecified abdominal pain: Secondary | ICD-10-CM | POA: Insufficient documentation

## 2020-03-13 DIAGNOSIS — A09 Infectious gastroenteritis and colitis, unspecified: Secondary | ICD-10-CM | POA: Diagnosis not present

## 2020-03-13 NOTE — ED Triage Notes (Signed)
Patient ambulatory to triage with steady gait, without difficulty or distress noted; pt reports since yesterday having generalized abd pain, HA, sore throat

## 2020-03-14 ENCOUNTER — Emergency Department
Admission: EM | Admit: 2020-03-14 | Discharge: 2020-03-14 | Disposition: A | Discharge status: Home / Self Care | Payer: Medicaid Other | Attending: Emergency Medicine | Admitting: Emergency Medicine

## 2020-03-14 DIAGNOSIS — R197 Diarrhea, unspecified: Secondary | ICD-10-CM

## 2020-03-14 DIAGNOSIS — J029 Acute pharyngitis, unspecified: Secondary | ICD-10-CM

## 2020-03-14 DIAGNOSIS — M791 Myalgia, unspecified site: Secondary | ICD-10-CM

## 2020-03-14 LAB — COMPREHENSIVE METABOLIC PANEL
ALT: 33 U/L (ref 0–44)
AST: 35 U/L (ref 15–41)
Albumin: 3.9 g/dL (ref 3.5–5.0)
Alkaline Phosphatase: 57 U/L (ref 38–126)
Anion gap: 10 (ref 5–15)
BUN: 9 mg/dL (ref 6–20)
CO2: 22 mmol/L (ref 22–32)
Calcium: 9 mg/dL (ref 8.9–10.3)
Chloride: 106 mmol/L (ref 98–111)
Creatinine, Ser: 0.78 mg/dL (ref 0.44–1.00)
GFR, Estimated: >60 mL/min (ref >60)
Glucose, Bld: 95 mg/dL (ref 70–99)
Potassium: 3.4 mmol/L — ABNORMAL LOW (ref 3.5–5.1)
Sodium: 138 mmol/L (ref 135–145)
Total Bilirubin: 0.5 mg/dL (ref 0.3–1.2)
Total Protein: 7.9 g/dL (ref 6.5–8.1)

## 2020-03-14 LAB — CBC WITH DIFFERENTIAL/PLATELET
Abs Immature Granulocytes: 0.05 10*3/uL (ref 0.00–0.07)
Basophils Absolute: 0.1 10*3/uL (ref 0.0–0.1)
Basophils Relative: 0 %
Eosinophils Absolute: 0.4 10*3/uL (ref 0.0–0.5)
Eosinophils Relative: 3 %
HCT: 39.7 % (ref 36.0–46.0)
Hemoglobin: 13.2 g/dL (ref 12.0–15.0)
Immature Granulocytes: 0 %
Lymphocytes Relative: 14 %
Lymphs Abs: 2 10*3/uL (ref 0.7–4.0)
MCH: 28.3 pg (ref 26.0–34.0)
MCHC: 33.2 g/dL (ref 30.0–36.0)
MCV: 85.2 fL (ref 80.0–100.0)
Monocytes Absolute: 1.1 10*3/uL — ABNORMAL HIGH (ref 0.1–1.0)
Monocytes Relative: 8 %
Neutro Abs: 10.4 10*3/uL — ABNORMAL HIGH (ref 1.7–7.7)
Neutrophils Relative %: 75 %
Platelets: 270 10*3/uL (ref 150–400)
RBC: 4.66 MIL/uL (ref 3.87–5.11)
RDW: 12.3 % (ref 11.5–15.5)
WBC: 13.9 10*3/uL — ABNORMAL HIGH (ref 4.0–10.5)
nRBC: 0 % (ref 0.0–0.2)

## 2020-03-14 LAB — URINALYSIS, COMPLETE (UACMP) WITH MICROSCOPIC
Bacteria, UA: NONE SEEN
Bilirubin Urine: NEGATIVE
Glucose, UA: NEGATIVE mg/dL
Hgb urine dipstick: NEGATIVE
Ketones, ur: NEGATIVE mg/dL
Nitrite: NEGATIVE
Protein, ur: NEGATIVE mg/dL
Specific Gravity, Urine: 1.034 — ABNORMAL HIGH (ref 1.005–1.030)
pH: 5 (ref 5.0–8.0)

## 2020-03-14 LAB — SARS CORONAVIRUS 2 (TAT 6-24 HRS): SARS Coronavirus 2: NEGATIVE

## 2020-03-14 LAB — LIPASE, BLOOD: Lipase: 37 U/L (ref 11–51)

## 2020-03-14 LAB — GROUP A STREP BY PCR: Group A Strep by PCR: NOT DETECTED

## 2020-03-14 LAB — POC URINE PREG, ED: Preg Test, Ur: NEGATIVE

## 2020-03-14 MED ORDER — CEPHALEXIN 500 MG PO CAPS
500.0000 mg | ORAL_CAPSULE | Freq: Once | ORAL | Status: AC
  Administered 2020-03-14: 500 mg via ORAL
  Filled 2020-03-14: qty 1

## 2020-03-14 MED ORDER — ONDANSETRON 4 MG PO TBDP
4.0000 mg | ORAL_TABLET | Freq: Three times a day (TID) | ORAL | 0 refills | Status: DC | PRN
Start: 2020-03-14 — End: 2020-08-28

## 2020-03-14 MED ORDER — CEPHALEXIN 500 MG PO CAPS: 500.0000 mg | ORAL_CAPSULE | Freq: Two times a day (BID) | ORAL | 0 refills | Status: AC

## 2020-03-14 MED ORDER — IBUPROFEN 600 MG PO TABS
600.0000 mg | ORAL_TABLET | Freq: Three times a day (TID) | ORAL | 0 refills | Status: DC | PRN
Start: 2020-03-14 — End: 2020-08-28

## 2020-03-14 MED ORDER — KETOROLAC TROMETHAMINE 60 MG/2ML IM SOLN
60.0000 mg | Freq: Once | INTRAMUSCULAR | Status: AC
  Administered 2020-03-14: 60 mg via INTRAMUSCULAR
  Filled 2020-03-14: qty 2

## 2020-03-14 MED ORDER — DEXAMETHASONE SODIUM PHOSPHATE 10 MG/ML IJ SOLN
10.0000 mg | Freq: Once | INTRAMUSCULAR | Status: AC
  Administered 2020-03-14: 10 mg via INTRAMUSCULAR
  Filled 2020-03-14: qty 1

## 2020-03-14 NOTE — ED Provider Notes (Signed)
[facility] Emergency Department Provider Note  ____________________________________________   Event Date/Time   First MD Initiated Contact with Patient 03/14/20 [phone]     (approximate)  I have reviewed the triage vital signs and the nursing notes.   HISTORY  Chief Complaint Abdominal Pain    HPI Jessica Randall is a 25 y.o. female  Here with sore throat, abd pain, diarrhea, and back pain.   The patient states that her symptoms started yesterday as aching, tingling sensation in her throat that was worse with any talking or swallowing.  She has since had worsening sore throat.  She has developed some intermittent cramp-like abdominal pain as well with loose diarrhea.  Her cramping resolves with diarrhea.  She has had no vomiting.  She also reports some mild lower back pain that is intermittent.  No actual urinary symptoms.  No flank pain.  She denies known specific sick contacts but does have small children in the home.  No vaginal bleeding or discharge.       Past Medical History:  Diagnosis Date  . Anemia   . Anxiety and depression    history of SI  . Cholelithiasis affecting pregnancy in third trimester, antepartum 05/12/2017  . GERD (gastroesophageal reflux disease)   . History of drug use    MJ  . History of gonorrhea    with G2  . Pain in symphysis pubis during pregnancy 01/30/2017  . Stroke-like symptoms 03/28/2015  . TIA (transient ischemic attack) 03/28/2015   6 days after delivery of baby    Patient Active Problem List   Diagnosis Date Noted  . Hypertension in pregnancy, preeclampsia, severe, delivered/postpartum 06/18/2019  . Preeclampsia in postpartum period 06/18/2019  . History of cesarean delivery 06/12/2019  . Labor and delivery, indication for care 06/04/2019  . Anemia affecting pregnancy 05/12/2019  . Back pain affecting pregnancy in third trimester 05/05/2019  . Preterm labor in third trimester without delivery 05/05/2019  .  Pelvic pain affecting pregnancy in third trimester, antepartum 05/01/2019  . Indication for care in labor and delivery, antepartum 04/30/2019  . Pelvic pain affecting pregnancy in second trimester, antepartum 03/13/2019  . Supervision of high risk pregnancy, antepartum 11/10/2018  . History of cesarean section complicating pregnancy 06/18/2017    Past Surgical History:  Procedure Laterality Date  . CESAREAN SECTION N/A 06/18/2017   Procedure: CESAREAN SECTION;  Surgeon: Natale Milch, MD;  Location: [facility] ORS;  Service: Obstetrics;  Laterality: N/A;  . CESAREAN SECTION N/A 06/13/2019   Procedure: CESAREAN SECTION;  Surgeon: Natale Milch, MD;  Location: [facility] ORS;  Service: Obstetrics;  Laterality: N/A;  . CHOLECYSTECTOMY Right 05/15/2017    Prior to Admission medications   Medication Sig Start Date End Date Taking? Authorizing Provider  cephALEXin (KEFLEX) 500 MG capsule Take 1 capsule (500 mg total) by mouth 2 (two) times daily for 10 days. 03/14/20 03/24/20 Yes Shaune Pollack, MD  ibuprofen (ADVIL) 600 MG tablet Take 1 tablet (600 mg total) by mouth every 8 (eight) hours as needed for moderate pain. 03/14/20  Yes Shaune Pollack, MD  ondansetron (ZOFRAN ODT) 4 MG disintegrating tablet Take 1 tablet (4 mg total) by mouth every 8 (eight) hours as needed for nausea or vomiting. 03/14/20  Yes Shaune Pollack, MD  ketorolac (TORADOL) 10 MG tablet Take 1 tablet (10 mg total) by mouth every 8 (eight) hours. 08/09/19   Menshew, Charlesetta Ivory, PA-C  labetalol (NORMODYNE) 100 MG tablet Take 1 tablet (100  mg total) by mouth 2 (two) times daily. 06/19/19   Vena Austria, MD  medroxyPROGESTERone (DEPO-PROVERA) 150 MG/ML injection Inject 1 mL (150 mg total) into the muscle every 3 (three) months. 07/06/19 10/04/19  Schuman, Jaquelyn Bitter, MD  enoxaparin (LOVENOX) 40 MG/0.4ML injection Inject 0.4 mLs (40 mg total) into the skin daily for 21 days. 06/23/19 08/09/19  Natale Milch, MD     Allergies Patient has no known allergies.  Family History  Problem Relation Age of Onset  . Lupus Mother   . Hypertension Mother   . Hyperthyroidism Mother   . Hyperlipidemia Mother   . Hypertension Maternal Grandmother   . Diabetes Maternal Grandmother   . Transient ischemic attack Maternal Grandmother   . Hyperlipidemia Maternal Grandfather   . Breast cancer Paternal Grandmother   . Transient ischemic attack Paternal Grandmother     Social History Social History   Tobacco Use  . Smoking status: Never Smoker  . Smokeless tobacco: Never Used  Vaping Use  . Vaping Use: Never used  Substance Use Topics  . Alcohol use: No    Alcohol/week: 0.0 standard drinks  . Drug use: No    Comment: h/o THC use    Review of Systems  Review of Systems  Constitutional: Positive for fatigue. Negative for fever.  HENT: Positive for sore throat. Negative for congestion.   Eyes: Negative for visual disturbance.  Respiratory: Negative for cough and shortness of breath.   Cardiovascular: Negative for chest pain.  Gastrointestinal: Positive for abdominal pain and diarrhea. Negative for nausea and vomiting.  Genitourinary: Negative for flank pain.  Musculoskeletal: Negative for back pain and neck pain.  Skin: Negative for rash and wound.  Neurological: Negative for weakness.  All other systems reviewed and are negative.    ____________________________________________  PHYSICAL EXAM:      VITAL SIGNS: ED Triage Vitals  Enc Vitals Group     BP 03/13/20 2351 121/77     Pulse Rate 03/13/20 2351 93     Resp 03/13/20 2351 18     Temp 03/13/20 2351 (!) 97.4 F (36.3 C)     Temp Source 03/13/20 2351 Oral     SpO2 03/13/20 2351 100 %     Weight 03/13/20 2336 198 lb (89.8 kg)     Height 03/13/20 2336 5\' 11"  (1.803 m)     Head Circumference --      Peak Flow --      Pain Score 03/13/20 2336 5     Pain Loc --      Pain Edu? --      Excl. in GC? --      Physical Exam Vitals and  nursing note reviewed.  Constitutional:      General: She is not in acute distress.    Appearance: She is well-developed.  HENT:     Head: Normocephalic and atraumatic.     Mouth/Throat:     Pharynx: Oropharyngeal exudate present.     Comments: 3+ tonsillar swelling with bilateral exudates.  No peritonsillar asymmetry or edema.  Uvula is midline and nonedematous. Eyes:     Conjunctiva/sclera: Conjunctivae normal.  Neck:     Comments: Mildly tender, mobile, shotty, anterior cervical lymphadenopathy Cardiovascular:     Rate and Rhythm: Normal rate and regular rhythm.     Heart sounds: Normal heart sounds. No murmur heard. No friction rub.  Pulmonary:     Effort: Pulmonary effort is normal. No respiratory distress.     Breath  sounds: Normal breath sounds. No wheezing or rales.  Abdominal:     General: There is no distension.     Palpations: Abdomen is soft.     Tenderness: There is no abdominal tenderness. There is no guarding or rebound.  Musculoskeletal:     Cervical back: Neck supple.  Skin:    General: Skin is warm.     Capillary Refill: Capillary refill takes less than 2 seconds.  Neurological:     Mental Status: She is alert and oriented to person, place, and time.     Motor: No abnormal muscle tone.       ____________________________________________   LABS (all labs ordered are listed, but only abnormal results are displayed)  Labs Reviewed  CBC WITH DIFFERENTIAL/PLATELET - Abnormal; Notable for the following components:      Result Value   WBC 13.9 (*)    Neutro Abs 10.4 (*)    Monocytes Absolute 1.1 (*)    All other components within normal limits  COMPREHENSIVE METABOLIC PANEL - Abnormal; Notable for the following components:   Potassium 3.4 (*)    All other components within normal limits  URINALYSIS, COMPLETE (UACMP) WITH MICROSCOPIC - Abnormal; Notable for the following components:   Color, Urine YELLOW (*)    APPearance HAZY (*)    Specific Gravity,  Urine 1.034 (*)    Leukocytes,Ua SMALL (*)    All other components within normal limits  GROUP A STREP BY PCR  SARS CORONAVIRUS 2 (TAT 6-24 HRS)  LIPASE, BLOOD  POC URINE PREG, ED    ____________________________________________  EKG:  ________________________________________  RADIOLOGY All imaging, including plain films, CT scans, and ultrasounds, independently reviewed by me, and interpretations confirmed via formal radiology reads.  ED MD interpretation:     Official radiology report(s): No results found.  ____________________________________________  PROCEDURES   Procedure(s) performed (including Critical Care):  Procedures  ____________________________________________  INITIAL IMPRESSION / MDM / ASSESSMENT AND PLAN / ED COURSE  As part of my medical decision making, I reviewed the following data within the electronic MEDICAL RECORD NUMBER Nursing notes reviewed and incorporated, Old chart reviewed, Notes from prior ED visits, and [place] Controlled Substance Database       *Jessica Randall was evaluated in Emergency Department on 03/14/2020 for the symptoms described in the history of present illness. She was evaluated in the context of the global COVID-19 pandemic, which necessitated consideration that the patient might be at risk for infection with the SARS-CoV-2 virus that causes COVID-19. Institutional protocols and algorithms that pertain to the evaluation of patients at risk for COVID-19 are in a state of rapid change based on information released by regulatory bodies including the CDC and federal and state organizations. These policies and algorithms were followed during the patient's care in the ED.  Some ED evaluations and interventions may be delayed as a result of limited staffing during the pandemic.*     Medical Decision Making: 25 year old female here with sore throat, mild abdominal pain and cramping.  Clinically, the patient appears very well in no acute distress.   Lab work shows mild likely reactive leukocytosis.  CMP unremarkable and lipase is normal.  Urinalysis without signs of UTI.  Urine pregnancy is negative.  Clinically, given patient's pharyngeal erythema and tonsillar swelling with exudates, fever, absence of cough, and anterior cervical lymphadenopathy, suspect infectious pharyngitis, likely strep.  Will treat empirically for this.  She was given Decadron as well.  Will send an outpatient Covid test as  well.  No signs of sepsis or systemic illness.  Her abdominal pain is intermittent, and she has no focal tenderness on exam to suggest cholecystitis, appendicitis, or other intra-abdominal pathology.  Encouraged fluids and hydration.  Work note provided.  ____________________________________________  FINAL CLINICAL IMPRESSION(S) / ED DIAGNOSES  Final diagnoses:  Pharyngitis, unspecified etiology  Diarrhea of presumed infectious origin  Myalgia     MEDICATIONS GIVEN DURING THIS VISIT:  Medications  dexamethasone (DECADRON) injection 10 mg (10 mg Intramuscular Given 03/14/20 0227)  ketorolac (TORADOL) injection 60 mg (60 mg Intramuscular Given 03/14/20 0227)  cephALEXin (KEFLEX) capsule 500 mg (500 mg Oral Given 03/14/20 0227)     ED Discharge Orders         Ordered    cephALEXin (KEFLEX) 500 MG capsule  2 times daily        03/14/20 0256    ibuprofen (ADVIL) 600 MG tablet  Every 8 hours PRN        03/14/20 0256    ondansetron (ZOFRAN ODT) 4 MG disintegrating tablet  Every 8 hours PRN        03/14/20 0256           Note:  This document was prepared using Dragon voice recognition software and may include unintentional dictation errors.   Shaune Pollack, MD 03/14/20 [phone]

## 2020-03-14 NOTE — ED Notes (Signed)
Pt reports h/a, sore throat, abd pain since yesterday.  No vomiting.  Reports diarrhea.  No vag bleeding.  No dysuria.  No back pain.  Pt alert  Speech clear.

## 2020-05-27 ENCOUNTER — Emergency Department: Payer: Medicaid Other

## 2020-05-27 ENCOUNTER — Other Ambulatory Visit: Payer: Self-pay

## 2020-05-27 ENCOUNTER — Emergency Department
Admission: EM | Admit: 2020-05-27 | Discharge: 2020-05-27 | Disposition: A | Discharge status: Home / Self Care | Payer: Medicaid Other | Attending: Physician Assistant | Admitting: Physician Assistant

## 2020-05-27 DIAGNOSIS — Z8673 Personal history of transient ischemic attack (TIA), and cerebral infarction without residual deficits: Secondary | ICD-10-CM | POA: Diagnosis not present

## 2020-05-27 DIAGNOSIS — R059 Cough, unspecified: Secondary | ICD-10-CM | POA: Insufficient documentation

## 2020-05-27 DIAGNOSIS — Z7901 Long term (current) use of anticoagulants: Secondary | ICD-10-CM | POA: Diagnosis not present

## 2020-05-27 MED ORDER — GUAIFENESIN-CODEINE 100-10 MG/5ML PO SOLN: 5.0000 mL | Freq: Three times a day (TID) | ORAL | 0 refills | Status: DC | PRN

## 2020-05-27 NOTE — ED Notes (Signed)
Mother reports desire to leave prior to child's results - will DC

## 2020-05-27 NOTE — Discharge Instructions (Addendum)
You were seen today for cough.  Your chest x-ray is negative for bronchitis or pneumonia.  We would encourage you to quit smoking as this could be the source of your cough.  I have sent in prescription for cough syrup to take every 8 hours as needed.  Please follow-up with your PCP if symptoms persist or worsen.

## 2020-05-27 NOTE — ED Provider Notes (Addendum)
[facility] Emergency Department Provider Note ____________________________________________  Time seen: 1510  I have reviewed the triage vital signs and the nursing notes.  HISTORY  Chief Complaint  Cough   HPI Jessica Randall is a 25 y.o. female presents to the ER today with complaint of cough.  She reports this started 2 weeks ago.  She reports the cough is productive but she is unsure of the color of the mucus because she does not look at it. The cough is worse at night. She denies headache, eye pain/redness/discharge, runny nose, nasal congestion, ear pain, sore throat, shortness of breath, chest pain, chest tightness, nausea, vomiting or diarrhea.  She denies fever, chills or body aches.  She has taken Robitussin and Mucinex OTC for her symptoms.  She has no history of asthma and denies COVID exposure.  She is a current smoker.  Past Medical History:  Diagnosis Date  . Anemia   . Anxiety and depression    history of SI  . Cholelithiasis affecting pregnancy in third trimester, antepartum 05/12/2017  . GERD (gastroesophageal reflux disease)   . History of drug use    MJ  . History of gonorrhea    with G2  . Pain in symphysis pubis during pregnancy 01/30/2017  . Stroke-like symptoms 03/28/2015  . TIA (transient ischemic attack) 03/28/2015   6 days after delivery of baby    Patient Active Problem List   Diagnosis Date Noted  . Hypertension in pregnancy, preeclampsia, severe, delivered/postpartum 06/18/2019  . Preeclampsia in postpartum period 06/18/2019  . History of cesarean delivery 06/12/2019  . Labor and delivery, indication for care 06/04/2019  . Anemia affecting pregnancy 05/12/2019  . Back pain affecting pregnancy in third trimester 05/05/2019  . Preterm labor in third trimester without delivery 05/05/2019  . Pelvic pain affecting pregnancy in third trimester, antepartum 05/01/2019  . Indication for care in labor and delivery, antepartum  04/30/2019  . Pelvic pain affecting pregnancy in second trimester, antepartum 03/13/2019  . Supervision of high risk pregnancy, antepartum 11/10/2018  . History of cesarean section complicating pregnancy 06/18/2017    Past Surgical History:  Procedure Laterality Date  . CESAREAN SECTION N/A 06/18/2017   Procedure: CESAREAN SECTION;  Surgeon: Natale Milch, MD;  Location: [facility] ORS;  Service: Obstetrics;  Laterality: N/A;  . CESAREAN SECTION N/A 06/13/2019   Procedure: CESAREAN SECTION;  Surgeon: Natale Milch, MD;  Location: [facility] ORS;  Service: Obstetrics;  Laterality: N/A;  . CHOLECYSTECTOMY Right 05/15/2017    Prior to Admission medications   Medication Sig Start Date End Date Taking? Authorizing Provider  guaiFENesin-codeine 100-10 MG/5ML syrup Take 5 mLs by mouth 3 (three) times daily as needed for cough. 05/27/20  Yes Telsa Dillavou, Salvadore Oxford, NP  ibuprofen (ADVIL) 600 MG tablet Take 1 tablet (600 mg total) by mouth every 8 (eight) hours as needed for moderate pain. 03/14/20   Shaune Pollack, MD  ketorolac (TORADOL) 10 MG tablet Take 1 tablet (10 mg total) by mouth every 8 (eight) hours. 08/09/19   Menshew, Charlesetta Ivory, PA-C  labetalol (NORMODYNE) 100 MG tablet Take 1 tablet (100 mg total) by mouth 2 (two) times daily. 06/19/19   Vena Austria, MD  medroxyPROGESTERone (DEPO-PROVERA) 150 MG/ML injection Inject 1 mL (150 mg total) into the muscle every 3 (three) months. 07/06/19 10/04/19  Schuman, Jaquelyn Bitter, MD  ondansetron (ZOFRAN ODT) 4 MG disintegrating tablet Take 1 tablet (4 mg total) by mouth every 8 (eight) hours as needed for  nausea or vomiting. 03/14/20   Shaune Pollack, MD  enoxaparin (LOVENOX) 40 MG/0.4ML injection Inject 0.4 mLs (40 mg total) into the skin daily for 21 days. 06/23/19 08/09/19  Natale Milch, MD    Allergies Patient has no known allergies.  Family History  Problem Relation Age of Onset  . Lupus Mother   . Hypertension Mother   .  Hyperthyroidism Mother   . Hyperlipidemia Mother   . Hypertension Maternal Grandmother   . Diabetes Maternal Grandmother   . Transient ischemic attack Maternal Grandmother   . Hyperlipidemia Maternal Grandfather   . Breast cancer Paternal Grandmother   . Transient ischemic attack Paternal Grandmother     Social History Social History   Tobacco Use  . Smoking status: Never Smoker  . Smokeless tobacco: Never Used  Vaping Use  . Vaping Use: Never used  Substance Use Topics  . Alcohol use: No    Alcohol/week: 0.0 standard drinks  . Drug use: No    Comment: h/o THC use    Review of Systems  Constitutional: Negative for fever, chills or body aches. Eyes: Negative for eye pain, redness or discharge. ENT: Negative for runny nose, nasal congestion, ear pain or sore throat. Cardiovascular: Negative for chest pain or chest tightness. Respiratory: Positive for cough.  Negative for shortness of breath. Gastrointestinal: Negative for nausea, vomiting and diarrhea. Skin: Negative for rash. Neurological: Negative for headaches, focal weakness, tingling or numbness. ____________________________________________  PHYSICAL EXAM:  VITAL SIGNS: ED Triage Vitals  Enc Vitals Group     BP 05/27/20 1734 116/78     Pulse Rate 05/27/20 1734 (!) 107     Resp 05/27/20 1734 18     Temp 05/27/20 1734 99 F (37.2 C)     Temp Source 05/27/20 1734 Oral     SpO2 05/27/20 1734 99 %     Weight 05/27/20 1733 195 lb (88.5 kg)     Height 05/27/20 1733 5\' 1"  (1.549 m)     Head Circumference --      Peak Flow --      Pain Score 05/27/20 1733 0     Pain Loc --      Pain Edu? --      Excl. in GC? --     Constitutional: Alert and oriented. Well appearing and in no distress. Head: Normocephalic. Eyes:Normal extraocular movements Ears: Canals clear. TMs intact bilaterally.  No effusion noted. Nose: Mucosa pink and moist.  No drainage noted. Mouth/Throat: Mucous membranes are moist.  No posterior  pharynx erythema or exudate noted. No PND. Hematological/Lymphatic/Immunological: No cervical lymphadenopathy. Cardiovascular: Tachycardic.  Respiratory: Normal respiratory effort. No wheezes/rales/rhonchi. Neurologic: Normal speech and language. No gross focal neurologic deficits are appreciated. Skin:  Skin is warm, dry and intact. No rash noted. ____________________________________________   RADIOLOGY  Imaging Orders     DG Chest 2 View IMPRESSION: No active cardiopulmonary disease.   ____________________________________________   INITIAL IMPRESSION / ASSESSMENT AND PLAN / ED COURSE  Cough:  DDx include viral cough, bronchitis, pneumonia, smokers cough Chest xray negative for bronchitic changes or pneumonia Cough could be related to smoking, encourage cessation RX for Robitussin AC cough syrup Q8H prn Recommend follow up with PCP if symptoms persist or worsen ____________________________________________  FINAL CLINICAL IMPRESSION(S) / ED DIAGNOSES  Final diagnoses:  Cough         05/29/20, NP 05/27/20 2006    05/29/20, MD 05/29/20 [phone]

## 2020-05-27 NOTE — ED Triage Notes (Signed)
Pt states she has had a cough x2 weeks that won't go away- pt states she is just coughing up mucous

## 2020-05-27 NOTE — ED Notes (Signed)
No peripheral IV placed this visit.   Discharge instructions reviewed with patient. Questions fielded by this RN. Patient verbalizes understanding of instructions. Patient discharged home in stable condition per [facility]. No acute distress noted at time of discharge.   Declined DC VS and signature

## 2020-08-08 ENCOUNTER — Encounter: Payer: Medicaid Other | Admitting: Obstetrics and Gynecology

## 2020-08-16 ENCOUNTER — Telehealth: Payer: Self-pay

## 2020-08-16 NOTE — Telephone Encounter (Signed)
Pt calling; stomach hurting reall really bad; having real black stools for past few days.  [phone]  Pt thinks she is about 9wks preg; is hurting around pelvic bone area; no bleeding; is not of iron.  Adv since no availability in office to go to an UC or ED.

## 2020-08-28 ENCOUNTER — Other Ambulatory Visit: Payer: Self-pay

## 2020-08-28 ENCOUNTER — Ambulatory Visit (INDEPENDENT_AMBULATORY_CARE_PROVIDER_SITE_OTHER): Payer: Medicaid Other | Admitting: Advanced Practice Midwife

## 2020-08-28 ENCOUNTER — Encounter: Payer: Self-pay | Admitting: Advanced Practice Midwife

## 2020-08-28 ENCOUNTER — Other Ambulatory Visit (HOSPITAL_COMMUNITY)
Admission: RE | Admit: 2020-08-28 | Discharge: 2020-08-28 | Disposition: A | Discharge status: Home / Self Care | Payer: Medicaid Other | Source: Ambulatory Visit | Attending: Obstetrics and Gynecology | Admitting: Obstetrics and Gynecology

## 2020-08-28 VITALS — BP 124/78 | HR 88 | Wt 193.0 lb

## 2020-08-28 DIAGNOSIS — Z369 Encounter for antenatal screening, unspecified: Secondary | ICD-10-CM | POA: Diagnosis present

## 2020-08-28 DIAGNOSIS — Z113 Encounter for screening for infections with a predominantly sexual mode of transmission: Secondary | ICD-10-CM

## 2020-08-28 DIAGNOSIS — O0991 Supervision of high risk pregnancy, unspecified, first trimester: Secondary | ICD-10-CM | POA: Insufficient documentation

## 2020-08-28 DIAGNOSIS — Z1159 Encounter for screening for other viral diseases: Secondary | ICD-10-CM

## 2020-08-28 DIAGNOSIS — N926 Irregular menstruation, unspecified: Secondary | ICD-10-CM | POA: Diagnosis not present

## 2020-08-28 DIAGNOSIS — Z3A11 11 weeks gestation of pregnancy: Secondary | ICD-10-CM

## 2020-08-28 DIAGNOSIS — Z130 Encounter for screening for diseases of the blood and blood-forming organs and certain disorders involving the immune mechanism: Secondary | ICD-10-CM

## 2020-08-28 DIAGNOSIS — O99211 Obesity complicating pregnancy, first trimester: Secondary | ICD-10-CM | POA: Insufficient documentation

## 2020-08-28 DIAGNOSIS — Z131 Encounter for screening for diabetes mellitus: Secondary | ICD-10-CM

## 2020-08-28 DIAGNOSIS — O09291 Supervision of pregnancy with other poor reproductive or obstetric history, first trimester: Secondary | ICD-10-CM | POA: Insufficient documentation

## 2020-08-28 DIAGNOSIS — O34219 Maternal care for unspecified type scar from previous cesarean delivery: Secondary | ICD-10-CM

## 2020-08-28 LAB — POCT URINE PREGNANCY: Preg Test, Ur: POSITIVE — AB

## 2020-08-28 NOTE — Progress Notes (Signed)
NOB - no concerns. RM 4 °

## 2020-08-28 NOTE — Patient Instructions (Signed)
Managing the Challenge of Quitting Smoking Quitting smoking is a physical and mental challenge. You will face cravings, withdrawal symptoms, and temptation. Before quitting, work with your health care provider to make a plan that can help you manage quitting. Preparation canhelp you quit and keep you from giving in. How to manage lifestyle changes Managing stress Stress can make you want to smoke, and wanting to smoke may cause stress. It is important to find ways to manage your stress. You might try some of the following: Practice relaxation techniques. Breathe slowly and deeply, in through your nose and out through your mouth. Listen to music. Soak in a bath or take a shower. Imagine a peaceful place or vacation. Get some support. Talk with family or friends about your stress. Join a support group. Talk with a counselor or therapist. Get some physical activity. Go for a walk, run, or bike ride. Play a favorite sport. Practice yoga.  Medicines Talk with your health care provider about medicines that might help you dealwith cravings and make quitting easier for you. Relationships Social situations can be difficult when you are quitting smoking. To manage this, you can: Avoid parties and other social situations where people might be smoking. Avoid alcohol. Leave right away if you have the urge to smoke. Explain to your family and friends that you are quitting smoking. Ask for support and let them know you might be a bit grumpy. Plan activities where smoking is not an option. General instructions Be aware that many people gain weight after they quit smoking. However, not everyone does. To keep from gaining weight, have a plan in place before you quit and stick to the plan after you quit. Your plan should include: Having healthy snacks. When you have a craving, it may help to: Eat popcorn, carrots, celery, or other cut vegetables. Chew sugar-free gum. Changing how you eat. Eat small  portion sizes at meals. Eat 4-6 small meals throughout the day instead of 1-2 large meals a day. Be mindful when you eat. Do not watch television or do other things that might distract you as you eat. Exercising regularly. Make time to exercise each day. If you do not have time for a long workout, do short bouts of exercise for 5-10 minutes several times a day. Do some form of strengthening exercise, such as weight lifting. Do some exercise that gets your heart beating and causes you to breathe deeply, such as walking fast, running, swimming, or biking. This is very important. Drinking plenty of water or other low-calorie or no-calorie drinks. Drink 6-8 glasses of water daily.  How to recognize withdrawal symptoms Your body and mind may experience discomfort as you try to get used to not having nicotine in your system. These effects are called withdrawal symptoms. They may include: Feeling hungrier than normal. Having trouble concentrating. Feeling irritable or restless. Having trouble sleeping. Feeling depressed. Craving a cigarette. To manage withdrawal symptoms: Avoid places, people, and activities that trigger your cravings. Remember why you want to quit. Get plenty of sleep. Avoid coffee and other caffeinated drinks. These may worsen some of your symptoms. These symptoms may surprise you. But be assured that they are normal to havewhen quitting smoking. How to manage cravings Come up with a plan for how to deal with your cravings. The plan should include the following: A definition of the specific situation you want to deal with. An alternative action you will take. A clear idea for how this action will help. The   name of someone who might help you with this. Cravings usually last for 5-10 minutes. Consider taking the following actions to help you with your plan to deal with cravings: Keep your mouth busy. Chew sugar-free gum. Suck on hard candies or a straw. Brush your  teeth. Keep your hands and body busy. Change to a different activity right away. Squeeze or play with a ball. Do an activity or a hobby, such as making bead jewelry, practicing needlepoint, or working with wood. Mix up your normal routine. Take a short exercise break. Go for a quick walk or run up and down stairs. Focus on doing something kind or helpful for someone else. Call a friend or family member to talk during a craving. Join a support group. Contact a quitline. Where to find support To get help or find a support group: Call the National Cancer Institute's Smoking Quitline: 1-800-QUIT NOW 803-749-3298) Visit the website of the Substance Abuse and Mental Health Services Administration: SkateOasis.com.pt Text QUIT to SmokefreeTXT: 349179 Where to find more information Visit these websites to find more information on quitting smoking: National Cancer Institute: www.smokefree.gov American Lung Association: www.lung.org American Cancer Society: www.cancer.org Centers for Disease Control and Prevention: FootballExhibition.com.br American Heart Association: www.heart.org Contact a health care provider if: You want to change your plan for quitting. The medicines you are taking are not helping. Your eating feels out of control or you cannot sleep. Get help right away if: You feel depressed or become very anxious. Summary Quitting smoking is a physical and mental challenge. You will face cravings, withdrawal symptoms, and temptation to smoke again. Preparation can help you as you go through these challenges. Try different techniques to manage stress, handle social situations, and prevent weight gain. You can deal with cravings by keeping your mouth busy (such as by chewing gum), keeping your hands and body busy, calling family or friends, or contacting a quitline for people who want to quit smoking. You can deal with withdrawal symptoms by avoiding places where people smoke, getting plenty of rest, and  avoiding drinks with caffeine. This information is not intended to replace advice given to you by your health care provider. Make sure you discuss any questions you have with your healthcare provider. Document Revised: 11/09/2018 Document Reviewed: 11/09/2018 Elsevier Patient Education  2022 Elsevier Inc. Exercise During Pregnancy Exercise is an important part of being healthy for people of all ages. Exercise improves the function of your heart and lungs and helps you maintain strength, flexibility, and a healthy body weight. Exercise also boosts energy levels andelevates mood. Most women should exercise regularly during pregnancy. Exercise routines may need to change as your pregnancy progresses. In rare cases, women with certain medical conditions or complications may be asked to limit or avoid exercise during pregnancy. Your health care provider will give you information on whatwill work for you. How does this affect me? Along with maintaining general strength and flexibility, exercising during pregnancy can help: Keep strength in muscles that are used during labor and childbirth. Decrease low back pain or symptoms of depression. Control weight gain during pregnancy. Reduce the risk of needing insulin if you develop diabetes during pregnancy. Decrease the risk of cesarean delivery. Speed up your recovery after giving birth. Relieve constipation. How does this affect my baby? Exercise can help you have a healthy pregnancy. Exercise does not cause early (premature) birth. It will not cause your baby to weigh less at birth. What exercises can I do? Many exercises are safe for  you to do during pregnancy. Do a variety of exercises that safely increase your heart and breathing rates and help you build and maintain muscle strength. Do exercises exactly as told by your health care provider. You may do these exercises: Walking. Swimming. Water aerobics. Riding a stationary bike. Modified yoga or  Pilates. Tell your instructor that you are pregnant. Avoid overstretching, and avoid lying on your back for long periods of time. Running or jogging. Choose this type of exercise only if: You ran or jogged regularly before your pregnancy. You can run or jog and still talk in complete sentences. What exercises should I avoid? You may be told to limit high-intensity exercise depending on your level of fitness and whether you exercised regularly before you were pregnant. You can tell that you are exercising at a high intensity if you are breathing much harder and faster and cannot hold a conversation while exercising. You must avoid: Contact sports. Activities that put you at risk for falling on or being hit in the belly, such as downhill skiing, waterskiing, surfing, rock climbing, cycling, gymnastics, and horseback riding. Scuba diving. Skydiving. Hot yoga or hot Pilates. These activities take place in a room that is heated to high temperatures. Jogging or running, unless you jogged or ran regularly before you were pregnant. While jogging or running, you should always be able to talk in full sentences. Do not run or jog so fast that you are unable to have a conversation. Do not exercise at more than 6,000 feet above sea level (high elevation) if you are not used to exercising at high elevation. How do I exercise in a safe way?  Avoid overheating. Do not exercise in very high temperatures. Wear loose-fitting, breathable clothes. Avoid dehydration. Drink enough fluid before, during, and after exercise to keep your urine pale yellow. Avoid overstretching. Because of hormone changes during pregnancy, it is easy to overstretch muscles, tendons, and ligaments. Start slowly and ask your health care provider to recommend the types of exercise that are safe for you. Do not exercise to lose weight. Wear a sports bra to support your breasts. Avoid standing still or lying flat on your back as much as you  can. Follow these instructions at home: Exercise on most days or all days of the week. Try to exercise for 30 minutes a day, 5 days a week, unless your health care provider tells you not to. If you actively exercised before your pregnancy and you are healthy, your health care provider may tell you to continue to do moderate-intensity to high-intensity exercise. If you are just starting to exercise or did not exercise much before your pregnancy, your health care provider may tell you to do low-intensity to moderate-intensity exercise. Questions to ask your health care provider Is exercise safe for me? What are signs that I should stop exercising? Does my health condition mean that I should not exercise during pregnancy? When should I avoid exercising during pregnancy? Stop exercising and contact a health care provider if: You have any unusual symptoms such as: Mild contractions of the uterus or cramps in the abdomen. A dizzy feeling that does not go away when you rest. Stop exercising and get help right away if: You have any unusual symptoms such as: Sudden, severe pain in your low back or your belly. Regular, painful contractions of your uterus. Chest pain. Bleeding or fluid leaking from your vagina. Shortness of breath. Headache. Pain and swelling of your calves. Summary Most women should exercise  regularly throughout pregnancy. In rare cases, women with certain medical conditions or complications may be asked to limit or avoid exercise during pregnancy. Do not exercise to lose weight during pregnancy. Your health care provider will tell you what level of physical activity is right for you. Stop exercising and contact a health care provider if you have unusual symptoms, such as mild contractions or dizziness. This information is not intended to replace advice given to you by your health care provider. Make sure you discuss any questions you have with your healthcare provider. Document  Revised: 09/07/2019 Document Reviewed: 09/07/2019 Elsevier Patient Education  2022 Elsevier Inc. Eating Plan for Pregnant Women While you are pregnant, your body requires additional nutrition to help support your growing baby. You also have a higher need for some vitamins and minerals, such as folic acid, calcium, iron, and vitamin D. Eating a healthy, well-balanced diet is very important for your health and your baby's health. Your need for extra calories varies over the course of your pregnancy. Pregnancy is divided into three trimesters, with each trimester lasting 3 months. For most women, it is recommended to consume: 150 extra calories a day during the first trimester. 300 extra calories a day during the second trimester. 300 extra calories a day during the third trimester. What are tips for following this plan? Cooking Practice good food safety and cleanliness. Wash your hands before you eat and after you prepare raw meat. Wash all fruits and vegetables well before peeling or eating. Taking these actions can help to prevent foodborne illnesses that can be very dangerous to your baby, such as listeriosis. Ask your health care provider for more information about listeriosis. Make sure that all meats, poultry, and eggs are cooked to food-safe temperatures or "well-done." Meal planning  Eat a variety of foods (especially fruits and vegetables) to get a full range of vitamins and minerals. Two or more servings of fish are recommended each week in order to get the most benefits from omega-3 fatty acids that are found in seafood. Choose fish that are lower in mercury, such as salmon and pollock. Limit your overall intake of foods that have "empty calories." These are foods that have little nutritional value, such as sweets, desserts, candies, and sugar-sweetened beverages. Drinks that contain caffeine are okay to drink, but it is better to avoid caffeine. Keep your total caffeine intake to less than  200 mg each day (which is 12 oz or 355 mL of coffee, tea, or soda) or the limit as told by your health care provider.  General information Do not try to lose weight or go on a diet during pregnancy. Take a prenatal vitamin to help meet your additional vitamin and mineral needs during pregnancy, specifically for folic acid, iron, calcium, and vitamin D. Remember to stay active. Ask your health care provider what types of exercise and activities are safe for you. What does 150 extra calories look like? Healthy options that provide 150 extra calories each day could be any of the following: 6-8 oz (170-227 g) plain low-fat yogurt with  cup (70 g) berries. 1 apple with 2 tsp (11 g) peanut butter. Cut-up vegetables with  cup (60 g) hummus. 8 fl oz (237 mL) low-fat chocolate milk. 1 stick of string cheese with 1 medium orange. 1 peanut butter and jelly sandwich that is made with one slice of whole-wheat bread and 1 tsp (5 g) of peanut butter. For 300 extra calories, you could eat two of these healthy  options each day. What is a healthy amount of weight to gain? The right amount of weight gain for you is based on your BMI (body mass index) before you became pregnant. If your BMI was less than 18 (underweight), you should gain 28-40 lb (13-18 kg). If your BMI was 18-24.9 (normal), you should gain 25-35 lb (11-16 kg). If your BMI was 25-29.9 (overweight), you should gain 15-25 lb (7-11 kg). If your BMI was 30 or greater (obese), you should gain 11-20 lb (5-9 kg). What if I am having twins or multiples? Generally, if you are carrying twins or multiples: You may need to eat 300-600 extra calories a day. The recommended range for total weight gain is 25-54 lb (11-25 kg), depending on your BMI before pregnancy. Talk with your health care provider to find out about nutritional needs, weight gain, and exercise that is right for you. What foods should I eat?  Fruits All fruits. Eat a variety of colors  and types of fruit. Remember to wash yourfruits well before peeling or eating. Vegetables All vegetables. Eat a variety of colors and types of vegetables. Remember towash your vegetables well before peeling or eating. Grains All grains. Choose whole grains, such as whole-wheat bread, oatmeal, or brownrice. Meats and other protein foods Lean meats, including chicken, [place]urkey, and lean cuts of beef, veal, or pork. Fish that is higher in omega-3 fatty acids and lower in mercury, such as salmon, herring, mussels, trout, sardines, pollock, shrimp, crab, and lobster.Tofu. Tempeh. Beans. Eggs. Peanut butter and other nut butters. Dairy Pasteurized milk and milk alternatives, such as almond milk. Pasteurized yogurtand pasteurized cheese. Cottage cheese. Sour cream. Beverages Water. Juices that contain 100% fruit juice or vegetable juice. Caffeine-freeteas and decaffeinated coffee. Fats and oils Fats and oils are okay to include in moderation. Sweets and desserts Sweets and desserts are okay to include in moderation. Seasoning and other foods All pasteurized condiments. The items listed above may not be a complete list of foods and beverages you can eat. Contact a dietitian for more information. What foods should I avoid? Fruits Raw (unpasteurized) fruit juices. Vegetables Unpasteurized vegetable juices. Meats and other protein foods Precooked or cured meat, such as bologna, hot dogs, sausages, or meat loaves. (If you must eat those meats, reheat them until they are steaming hot.) Refrigerated pate, meat spreads from a meat counter, or smoked seafood that is found in the refrigerated section of a store. Raw or undercooked meats, poultry, and eggs. Raw fish, such as sushi or sashimi. Fish that have highmercury content, such as tilefish, shark, swordfish, and king mackerel. Dairy Unpasteurized milk and any foods that have unpasteurized milk in them. Soft cheeses, such as feta, queso blanco, queso  fresco, GrotonBrie, Camembert, panela, and blue-veined cheeses (unless they are made with pasteurized milk, which must bestated on the label). Beverages Alcohol. Sugar-sweetened beverages, such as sodas, teas, or energy drinks. Seasoning and other foods Homemade fermented foods and drinks, such as pickles, sauerkraut, or kombuchadrinks. (Store-bought pasteurized versions of these are okay.) Salads that are made in a store or deli, such as ham salad, chicken salad, eggsalad, tuna salad, and seafood salad. The items listed above may not be a complete list of foods and beverages you should avoid. Contact a dietitian for more information. Where to find more information To calculate the number of calories you need based on your height, weight, and activity level, you can use an online calculator such as: PayStrike.dkwww.myplate.gov/myplate-plan To calculate how much weight you  should gain during pregnancy, you can use an online pregnancy weight gain calculator such as: http://www.harvey.com/ To learn more about eating fish during pregnancy, talk with your health care provider or visit: PumpkinSearch.com.ee Summary While you are pregnant, your body requires additional nutrition to help support your growing baby. Eat a variety of foods, especially fruits and vegetables, to get a full range of vitamins and minerals. Practice good food safety and cleanliness. Wash your hands before you eat and after you prepare raw meat. Wash all fruits and vegetables well before peeling or eating. Taking these actions can help to prevent foodborne illnesses, such as listeriosis, that can be very dangerous to your baby. Do not eat raw meat or fish. Do not eat fish that have high mercury content, such as tilefish, shark, swordfish, and king mackerel. Do not eat raw (unpasteurized) dairy. Take a prenatal vitamin to help meet your additional vitamin and mineral needs during pregnancy, specifically for folic acid, iron, calcium, and vitamin D. This  information is not intended to replace advice given to you by your health care provider. Make sure you discuss any questions you have with your healthcare provider. Document Revised: 08/18/2019 Document Reviewed: 08/18/2019 Elsevier Patient Education  2022 Elsevier Inc. https://www.acog.org/womens-health/faqs/prenatal-genetic-screening-tests">  Prenatal Care Prenatal care is health care during pregnancy. It helps you and your unborn baby (fetus) stay as healthy as possible. Prenatal care may be provided by a midwife, a family practice doctor, a Dispensing optician (nurse practitioner or physician assistant), or a childbirth and pregnancy doctor (obstetrician). How does this affect me? During pregnancy, you will be closely monitored for any new conditions that might develop. To lower your risk of pregnancy complications, you and yourhealth care provider will talk about any underlying conditions you have. How does this affect my baby? Early and consistent prenatal care increases the chance that your baby will be healthy during pregnancy. Prenatal care lowers the risk that your baby will be: Born early (prematurely). Smaller than expected at birth (small for gestational age). What can I expect at the first prenatal care visit? Your first prenatal care visit will likely be the longest. You should schedule your first prenatal care visit as soon as you know that you are pregnant. Your first visit is a good time to talk about any questions or concerns you haveabout pregnancy. Medical history At your visit, you and your health care provider will talk about your medical history, including: Any past pregnancies. Your family's medical history. Medical history of the baby's father. Any long-term (chronic) health conditions you have and how you manage them. Any surgeries or procedures you have had. Any current over-the-counter or prescription medicines, herbs, or supplements that you are taking. Other  factors that could pose a risk to your baby, including: Exposure to harmful chemicals or radiation at work or at home. Any substance use, including tobacco, alcohol, and drug use. Your home setting and your stress levels, including: Exposure to abuse or violence. Household financial strain. Your daily health habits, including diet and exercise. Tests and screenings Your health care provider will: Measure your weight, height, and blood pressure. Do a physical exam, including a pelvic and breast exam. Perform blood tests and urine tests to check for: Urinary tract infection. Sexually transmitted infections (STIs). Low iron levels in your blood (anemia). Blood type and certain proteins on red blood cells (Rh antibodies). Infections and immunity to viruses, such as hepatitis B and rubella. HIV (human immunodeficiency virus). Discuss your options for genetic screening. Tips about  staying healthy Your health care provider will also give you information about how to keep yourself and your baby healthy, including: Nutrition and taking vitamins. Physical activity. How to manage pregnancy symptoms such as nausea and vomiting (morning sickness). Infections and substances that may be harmful to your baby and how to avoid them. Food safety. Dental care. Working. Travel. Warning signs to watch for and when to call your health care provider. How often will I have prenatal care visits? After your first prenatal care visit, you will have regular visits throughout your pregnancy. The visit schedule is often as follows: Up to week 28 of pregnancy: once every 4 weeks. 28-36 weeks: once every 2 weeks. After 36 weeks: every week until delivery. Some women may have visits more or less often depending on any underlyinghealth conditions and the health of the baby. Keep all follow-up and prenatal care visits. This is important. What happens during routine prenatal care visits? Your health care provider  will: Measure your weight and blood pressure. Check for fetal heart sounds. Measure the height of your uterus in your abdomen (fundal height). This may be measured starting around week 20 of pregnancy. Check the position of your baby inside your uterus. Ask questions about your diet, sleeping patterns, and whether you can feel the baby move. Review warning signs to watch for and signs of labor. Ask about any pregnancy symptoms you are having and how you are dealing with them. Symptoms may include: Headaches. Nausea and vomiting. Vaginal discharge. Swelling. Fatigue. Constipation. Changes in your vision. Feeling persistently sad or anxious. Any discomfort, including back or pelvic pain. Bleeding or spotting. Make a list of questions to ask your health care provider at your routinevisits. What tests might I have during prenatal care visits? You may have blood, urine, and imaging tests throughout your pregnancy, such as: Urine tests to check for glucose, protein, or signs of infection. Glucose tests to check for a form of diabetes that can develop during pregnancy (gestational diabetes mellitus). This is usually done around week 24 of pregnancy. Ultrasounds to check your baby's growth and development, to check for birth defects, and to check your baby's well-being. These can also help to decide when you should deliver your baby. A test to check for group B strep (GBS) infection. This is usually done around week 36 of pregnancy. Genetic testing. This may include blood, fluid, or tissue sampling, or imaging tests, such as an ultrasound. Some genetic tests are done during the first trimester and some are done during the second trimester. What else can I expect during prenatal care visits? Your health care provider may recommend getting certain vaccines during pregnancy. These may include: A yearly flu shot (annual influenza vaccine). This is especially important if you will be pregnant during  flu season. Tdap (tetanus, diphtheria, pertussis) vaccine. Getting this vaccine during pregnancy can protect your baby from whooping cough (pertussis) after birth. This vaccine may be recommended between weeks 27 and 36 of pregnancy. A COVID-19 vaccine. Later in your pregnancy, your health care provider may give you information about: Childbirth and breastfeeding classes. Choosing a health care provider for your baby. Umbilical cord banking. Breastfeeding. Birth control after your baby is born. The hospital labor and delivery unit and how to set up a tour. Registering at the hospital before you go into labor. Where to find more information Office on Women's Health: TravelLesson.ca American Pregnancy Association: americanpregnancy.org March of Dimes: marchofdimes.org Summary Prenatal care helps you and your baby stay  as healthy as possible during pregnancy. Your first prenatal care visit will most likely be the longest. You will have visits and tests throughout your pregnancy to monitor your health and your baby's health. Bring a list of questions to your visits to ask your health care provider. Make sure to keep all follow-up and prenatal care visits. This information is not intended to replace advice given to you by your health care provider. Make sure you discuss any questions you have with your healthcare provider. Document Revised: 11/03/2019 Document Reviewed: 11/03/2019 Elsevier Patient Education  2022 [employer].

## 2020-08-28 NOTE — Progress Notes (Signed)
New Obstetric Patient H&P    Chief Complaint: "Desires prenatal care"   History of Present Illness: Patient is a 25 y.o. [id number] Not Hispanic or Latino female, presents with amenorrhea and positive home pregnancy test. Patient's last menstrual period was 06/12/2020 (exact date). and based on her  LMP, her EDD is Estimated Date of Delivery: 03/19/21 and her EGA is [id number]. Cycles are 5-7 days, regular, and occur approximately every : 28 days. Her last pap smear was 2 years ago and was no abnormalities.    She had a urine pregnancy test which was positive 6 week(s)  ago. Her last menstrual period was normal and lasted for  5 or 6 day(s). Since her LMP she claims she has experienced breast tenderness, fatigue, nausea. She denies vaginal bleeding. Her past medical history is noncontributory. She has a history of cholecystectomy. Her prior pregnancies are notable for  G1 and G2 SVDs, G3 urgent c/s fetal heart rate indications, G4 Repeat elective c/s, all males, pelvis tested to 7#, postpartum preeclampsia on magnesium with G2, 3, 4.  Since her LMP, she admits to the use of tobacco products  she was attempting to quit and is still smoking 2-3 c/d She claims she has gained no pounds since the start of her pregnancy.  There are cats in the home in the home  no  She admits close contact with children on a regular basis  yes  She has had chicken pox in the past yes She has had Tuberculosis exposures, symptoms, or previously tested positive for TB   no Current or past history of domestic violence. no  Genetic Screening/Teratology Counseling: (Includes patient, baby's father, or anyone in either family with:)   1. [facility]= [facility]  no 2. Thalassemia ([place], [place], Mediterranean, or Asian background): MCV<80  no 3. Neural tube defect (meningomyelocele, spina bifida, anencephaly)  no 4. Congenital heart defect  no  5. Down syndrome  no 6. Tay-Sachs (Jewish, [place] Canadian)  no 7. Canavan's  Disease  no 8. Sickle cell disease or trait (African)  no  9. Hemophilia or other blood disorders  no  10. Muscular dystrophy  no  11. Cystic fibrosis  no  12. Huntington's Chorea  no  13. Mental retardation/autism  no 14. Other inherited genetic or chromosomal disorder  no 15. Maternal metabolic disorder (DM, PKU, etc)  no 16. Patient or FOB with a child with a birth defect not listed above no  16a. Patient or FOB with a birth defect themselves no 17. Recurrent pregnancy loss, or stillbirth  no  18. Any medications since LMP other than prenatal vitamins (include vitamins, supplements, OTC meds, drugs, alcohol)  no 19. Any other genetic/environmental exposure to discuss  no  Infection History:   1. Lives with someone with TB or TB exposed  no  2. Patient or partner has history of genital herpes  no 3. Rash or viral illness since LMP  no 4. History of STI (GC, CT, HPV, syphilis, HIV)  remote hx GC, tx'ed 5. History of recent travel :  no  Other pertinent information:  no    Review of Systems:10 point review of systems negative unless otherwise noted in HPI  Past Medical History:  Patient Active Problem List   Diagnosis Date Noted   Obesity affecting pregnancy in first trimester 08/28/2020   Hx of preeclampsia, prior pregnancy, currently pregnant, first trimester 08/28/2020   History of cesarean delivery 06/12/2019   Supervision of high risk  pregnancy in first trimester 11/10/2018     Nursing Staff Provider  Office Location  [facility] Dating    Language  English Anatomy US    Flu Vaccine   Genetic Screen  NIPS:   TDaP vaccine    Hgb A1C or  GTT Early : Third trimester :   Covid    LAB RESULTS   Rhogam   Blood Type     Feeding Plan Breast Antibody    Contraception  Rubella    Circumcision  RPR     Pediatrician   HBsAg     Support Person Derrick HIV    Prenatal Classes  Varicella     GBS  (For PCN allergy, check sensitivities)   BTL Consent     VBAC Consent 2 svd's  followed by 2 c/s's- desires VBAC Pap  2020 negative    Hgb Electro    Pelvis Tested  CF      SMA            History of cesarean section complicating pregnancy 06/18/2017    Past Surgical History:  Past Surgical History:  Procedure Laterality Date   CESAREAN SECTION N/A 06/18/2017   Procedure: CESAREAN SECTION;  Surgeon: Natale Milch, MD;  Location: [facility] ORS;  Service: Obstetrics;  Laterality: N/A;   CESAREAN SECTION N/A 06/13/2019   Procedure: CESAREAN SECTION;  Surgeon: Natale Milch, MD;  Location: [facility] ORS;  Service: Obstetrics;  Laterality: N/A;   CHOLECYSTECTOMY Right 05/15/2017    Gynecologic History: Patient's last menstrual period was 06/12/2020 (exact date).  Obstetric History: [id number]  Family History:  Family History  Problem Relation Age of Onset   Lupus Mother    Hypertension Mother    Hyperthyroidism Mother    Hyperlipidemia Mother    Hypertension Maternal Grandmother    Diabetes Maternal Grandmother    Transient ischemic attack Maternal Grandmother    Hyperlipidemia Maternal Grandfather    Breast cancer Paternal Grandmother    Transient ischemic attack Paternal Grandmother     Social History:  Social History   Socioeconomic History   Marital status: Single    Spouse name: Tyrone   Number of children: 3   Years of education: 9   Highest education level: 9th grade  Occupational History   Occupation: food Designer, multimedia: [employer]   Occupation: Unemployed  Tobacco Use   Smoking status: Never   Smokeless tobacco: Never  Building services engineer Use: Never used  Substance and Sexual Activity   Alcohol use: No    Alcohol/week: 0.0 standard drinks   Drug use: No    Comment: h/o THC use   Sexual activity: Yes    Birth control/protection: Surgical, Injection    Comment: PLANNING SHOT  Other Topics Concern   Not on file  Social History Narrative   Not on file   Social Determinants of Health   Financial Resource Strain: Not on  file  Food Insecurity: Not on file  Transportation Needs: Not on file  Physical Activity: Not on file  Stress: Not on file  Social Connections: Not on file  Intimate Partner Violence: Not on file    Allergies:  No Known Allergies  Medications: Prior to Admission medications   Medication Sig Start Date End Date Taking? Authorizing Provider  enoxaparin (LOVENOX) 40 MG/0.4ML injection Inject 0.4 mLs (40 mg total) into the skin daily for 21 days. 06/23/19 08/09/19  Natale Milch, MD    Physical Exam Vitals: Blood  pressure 124/78, pulse 88, weight 193 lb (87.5 kg), last menstrual period 06/12/2020  General: NAD HEENT: normocephalic, anicteric Thyroid: no enlargement, no palpable nodules Pulmonary: No increased work of breathing, CTAB Cardiovascular: RRR, distal pulses 2+ Abdomen: NABS, soft, non-tender, non-distended.  Umbilicus without lesions.  No hepatomegaly, splenomegaly or masses palpable. No evidence of hernia. FHT's 150's by doppler  Genitourinary:  External: Normal external female genitalia.  Normal urethral meatus, normal Bartholin's and Skene's glands.    Vagina: Normal vaginal mucosa, no evidence of prolapse.   Extremities: no edema, erythema, or tenderness Neurologic: Grossly intact Psychiatric: mood appropriate, affect full   The following were addressed during this visit:  Breastfeeding Education - Early initiation of breastfeeding    Comments: Keeps milk supply adequate, helps contract uterus and slow bleeding, and early milk is the perfect first food and is easy to digest.   - The importance of exclusive breastfeeding    Comments: Provides antibodies, Lower risk of breast and ovarian cancers, and type-2 diabetes,Helps your body recover, Reduced chance of SIDS.   - Risks of giving your baby anything other than breast milk if you are breastfeeding    Comments: Make the baby less content with breastfeeds, may make my baby more susceptible to illness, and  may reduce my milk supply.   - The importance of early skin-to-skin contact    Comments:  Keeps baby warm and secure, helps keep baby's blood sugar up and breathing steady, easier to bond and breastfeed, and helps calm baby.  - Rooming-in on a 24-hour basis    Comments: Easier to learn baby's feeding cues, easier to bond and get to know each other, and encourages milk production.   - Feeding on demand or baby-led feeding    Comments: Helps prevent breastfeeding complications, helps bring in good milk supply, prevents under or overfeeding, and helps baby feel content and satisfied   - Frequent feeding to help assure optimal milk production    Comments: Making a full supply of milk requires frequent removal of milk from breasts, infant will eat 8-12 times in 24 hours, if separated from infant use breast massage, hand expression and/ or pumping to remove milk from breasts.   - Effective positioning and attachment    Comments: Helps my baby to get enough breast milk, helps to produce an adequate milk supply, and helps prevent nipple pain and damage   - Exclusive breastfeeding for the first 6 months    Comments: Builds a healthy milk supply and keeps it up, protects baby from sickness and disease, and breastmilk has everything your baby needs for the first 6 months.   Assessment: 25 y.o. [id number] at [redacted]w[redacted]d by LMP presenting to initiate prenatal care  Plan: 1) Avoid alcoholic beverages. 2) Patient encouraged not to smoke.  3) Discontinue the use of all non-medicinal drugs and chemicals.  4) Take prenatal vitamins daily.  5) Nutrition, food safety (fish, cheese advisories, and high nitrite foods) and exercise discussed. 6) Hospital and practice style discussed with cross coverage system.  7) Genetic Screening, such as with 1st Trimester Screening, cell free fetal DNA, AFP testing, and Ultrasound, as well as with amniocentesis and CVS as appropriate, is discussed with patient. At the  conclusion of today's visit patient requested genetic testing 8) Patient is asked about travel to areas at risk for the Zika virus, and counseled to avoid travel and exposure to mosquitoes or sexual partners who may have themselves been exposed to the virus. Testing is discussed,  and will be ordered as appropriate.  9) Urine culture, Aptima today 10) Return to clinic in 1 week for dating scan, early 1 hr gtt, NOB panel, Hep C screen, sickle cell screen and MaterniT 21   Tresea Mall, CNM [facility] 08/28/2020, 12:04 PM

## 2020-08-29 LAB — CERVICOVAGINAL ANCILLARY ONLY
Chlamydia: NEGATIVE
Comment: NEGATIVE
Comment: NEGATIVE
Comment: NORMAL
Neisseria Gonorrhea: NEGATIVE
Trichomonas: NEGATIVE

## 2020-08-30 LAB — URINE CULTURE

## 2020-09-05 ENCOUNTER — Other Ambulatory Visit: Payer: Self-pay

## 2020-09-05 ENCOUNTER — Other Ambulatory Visit: Payer: Medicaid Other

## 2020-09-05 ENCOUNTER — Encounter: Payer: Self-pay | Admitting: Obstetrics & Gynecology

## 2020-09-05 ENCOUNTER — Ambulatory Visit (INDEPENDENT_AMBULATORY_CARE_PROVIDER_SITE_OTHER): Payer: Medicaid Other | Admitting: Obstetrics & Gynecology

## 2020-09-05 VITALS — BP 120/80 | Wt 192.0 lb

## 2020-09-05 DIAGNOSIS — O99211 Obesity complicating pregnancy, first trimester: Secondary | ICD-10-CM

## 2020-09-05 DIAGNOSIS — Z131 Encounter for screening for diabetes mellitus: Secondary | ICD-10-CM

## 2020-09-05 DIAGNOSIS — O0991 Supervision of high risk pregnancy, unspecified, first trimester: Secondary | ICD-10-CM

## 2020-09-05 DIAGNOSIS — Z130 Encounter for screening for diseases of the blood and blood-forming organs and certain disorders involving the immune mechanism: Secondary | ICD-10-CM

## 2020-09-05 DIAGNOSIS — Z1159 Encounter for screening for other viral diseases: Secondary | ICD-10-CM

## 2020-09-05 DIAGNOSIS — Z1379 Encounter for other screening for genetic and chromosomal anomalies: Secondary | ICD-10-CM

## 2020-09-05 DIAGNOSIS — Z369 Encounter for antenatal screening, unspecified: Secondary | ICD-10-CM

## 2020-09-05 DIAGNOSIS — Z113 Encounter for screening for infections with a predominantly sexual mode of transmission: Secondary | ICD-10-CM

## 2020-09-05 NOTE — Progress Notes (Signed)
  Subjective  Min nausea No VB or pain  Objective  BP 120/80   Wt 192 lb (87.1 kg)   LMP 06/12/2020 (Exact Date)   BMI 36.28 kg/m  General: NAD Pumonary: no increased work of breathing Abdomen: gravid, non-tender Extremities: no edema Psychiatric: mood appropriate, affect full  Assessment  25 y.o. [id number] at [redacted]w[redacted]d by  03/19/2021, by Last Menstrual Period presenting for routine prenatal visit  Plan   Problem List Items Addressed This Visit      Other   Supervision of high risk pregnancy in first trimester - Primary   Obesity affecting pregnancy in first trimester   Relevant Orders   [place] MFM OB DETAIL +14 WK  Other Visit Diagnoses    Encounter for genetic screening for Down Syndrome       Relevant Orders   MaterniT21 PLUS Core+SCA    See [place] report PNV CS vs VBAC options discussed Labs today  pregnancy 5 Problems (from 08/28/20 to present)    Problem Noted Resolved   Hx of preeclampsia, prior pregnancy, currently pregnant, first trimester 08/28/2020 by Tresea Mall, CNM No   Supervision of high risk pregnancy in first trimester 11/10/2018 by Oswaldo Conroy, CNM No   Overview Addendum 08/28/2020 12:01 PM by Tresea Mall, CNM     Nursing Staff Provider  Office Location  [facility]ating    Language  English Anatomy US    Flu Vaccine   Genetic Screen  NIPS:   TDaP vaccine    Hgb A1C or  GTT Early : Third trimester :   Covid    LAB RESULTS   Rhogam   Blood Type     Feeding Plan Breast Antibody    Contraception  Rubella    Circumcision  RPR     Pediatrician   HBsAg     Support Person Derrick HIV    Prenatal Classes  Varicella     GBS  (For PCN allergy, check sensitivities)   BTL Consent     VBAC Consent 2 svd's followed by 2 c/s's- desires VBAC Pap  2020 negative    Hgb Electro    Pelvis Tested  CF      SMA                   Annamarie Major, MD, [facility], [facility] 09/05/2020  4:04 PM

## 2020-09-05 NOTE — Patient Instructions (Signed)

## 2020-09-05 NOTE — Progress Notes (Signed)
ULTRASOUND REPORT  Location: [facility] Date of Service: 09/05/2020   Indications:dating Findings:  Mason Jim intrauterine pregnancy is visualized with a CRL consistent with [redacted]w[redacted]d gestation, giving an (U/S) EDD of 03/22/21. The (U/S) EDD is consistent with the clinically established EDD of 03/19/21.  FHR: 150 BPM CRL measurement: 50 mm Yolk sac is not visualized. Amnion: visualized and appears normal   Right Ovary is normal in appearance. Left Ovary is normal appearance. Corpus luteal cyst:  is not visualized Survey of the adnexa demonstrates no adnexal masses. There is no free peritoneal fluid in the cul de sac.  Impression: 1. [redacted]w[redacted]d Viable Singleton Intrauterine pregnancy by U/S. 2. (U/S) EDD is consistent with Clinically established EDD of 03/19/21.  Recommendations: 1.Clinical correlation with the patient's History and Physical Exam. 2. Keep EDC  Letitia Libra, MD

## 2020-09-08 LAB — RPR+RH+ABO+RUB AB+AB SCR+CB...
Antibody Screen: NEGATIVE
HIV Screen 4th Generation wRfx: NONREACTIVE
Hematocrit: 36.5 % (ref 34.0–46.6)
Hemoglobin: 12.6 g/dL (ref 11.1–15.9)
Hepatitis B Surface Ag: NEGATIVE
MCH: 28 pg (ref 26.6–33.0)
MCHC: 34.5 g/dL (ref 31.5–35.7)
MCV: 81 fL (ref 79–97)
Platelets: 261 10*3/uL (ref 150–450)
RBC: 4.5 x10E6/uL (ref 3.77–5.28)
RDW: 12.4 % (ref 11.7–15.4)
RPR Ser Ql: NONREACTIVE
Rh Factor: POSITIVE
Rubella Antibodies, IGG: 2.54 index (ref >0.99)
Varicella zoster IgG: <135 index — ABNORMAL LOW (ref >165)
WBC: 5.2 10*3/uL (ref 3.4–10.8)

## 2020-09-08 LAB — GLUCOSE, 1 HOUR GESTATIONAL: Gestational Diabetes Screen: 133 mg/dL (ref 65–139)

## 2020-09-08 LAB — HEPATITIS C ANTIBODY: Hep C Virus Ab: <0.1 s/co ratio (ref 0.0–0.9)

## 2020-09-08 LAB — HGB FRACTIONATION CASCADE
Hgb A2: 2.8 % (ref 1.8–3.2)
Hgb A: 97.2 % (ref 96.4–98.8)
Hgb F: 0 % (ref 0.0–2.0)
Hgb S: 0 %

## 2020-09-09 LAB — MATERNIT21 PLUS CORE+SCA
Fetal Fraction: 8
Monosomy X (Turner Syndrome): NOT DETECTED
Result (T21): NEGATIVE
Trisomy 13 (Patau syndrome): NEGATIVE
Trisomy 18 (Edwards syndrome): NEGATIVE
Trisomy 21 (Down syndrome): NEGATIVE
XXX (Triple X Syndrome): NOT DETECTED
XXY (Klinefelter Syndrome): NOT DETECTED
XYY (Jacobs Syndrome): NOT DETECTED

## 2020-10-02 ENCOUNTER — Other Ambulatory Visit: Payer: Self-pay

## 2020-10-02 ENCOUNTER — Ambulatory Visit (INDEPENDENT_AMBULATORY_CARE_PROVIDER_SITE_OTHER): Payer: Medicaid Other | Admitting: Obstetrics

## 2020-10-02 VITALS — BP 126/84 | Wt 197.0 lb

## 2020-10-02 DIAGNOSIS — Z3A16 16 weeks gestation of pregnancy: Secondary | ICD-10-CM

## 2020-10-02 DIAGNOSIS — O0991 Supervision of high risk pregnancy, unspecified, first trimester: Secondary | ICD-10-CM

## 2020-10-02 LAB — POCT URINALYSIS DIPSTICK OB
Glucose, UA: NEGATIVE
POC,PROTEIN,UA: NEGATIVE

## 2020-10-02 NOTE — Progress Notes (Signed)
ROB - no concerns. RM 6 

## 2020-10-02 NOTE — Progress Notes (Signed)
Routine Prenatal Care Visit  Subjective  Jessica Randall is a 25 y.o. [id number] at [redacted]w[redacted]d being seen today for ongoing prenatal care.  She is currently monitored for the following issues for this high-risk pregnancy and has History of cesarean section complicating pregnancy; Supervision of high risk pregnancy in first trimester; History of cesarean delivery; Obesity affecting pregnancy in first trimester; and Hx of preeclampsia, prior pregnancy, currently pregnant, first trimester on their problem list.  ----------------------------------------------------------------------------------- Patient reports no complaints.  Knows she is having a girl this time. Name undecided.  .  .   [name] Fluid denies.  ----------------------------------------------------------------------------------- The following portions of the patient's history were reviewed and updated as appropriate: allergies, current medications, past family history, past medical history, past social history, past surgical history and problem list. Problem list updated.  Objective  Blood pressure 126/84, weight 197 lb (89.4 kg), last menstrual period 06/12/2020, currently breastfeeding. Pregravid weight 180 lb (81.6 kg) Total Weight Gain 17 lb (7.711 kg) Urinalysis: Urine Protein Negative  Urine Glucose Negative  Fetal Status:           General:  Alert, oriented and cooperative. Patient is in no acute distress.  Skin: Skin is warm and dry. No rash noted.   Cardiovascular: Normal heart rate noted  Respiratory: Normal respiratory effort, no problems with respiration noted  Abdomen: Soft, gravid, appropriate for gestational age.       Pelvic:  Cervical exam deferred        Extremities: Normal range of motion.     Mental Status: Normal mood and affect. Normal behavior. Normal judgment and thought content.   Assessment   25 y.o. [id number] at [redacted]w[redacted]d by  03/19/2021, by Last Menstrual Period presenting for routine prenatal visit  Plan    pregnancy 5 Problems (from 08/28/20 to present)    Problem Noted Resolved   Hx of preeclampsia, prior pregnancy, currently pregnant, first trimester 08/28/2020 by Tresea Mall, CNM No   Supervision of high risk pregnancy in first trimester 11/10/2018 by Oswaldo Conroy, CNM No   Overview Addendum 10/02/2020  2:38 PM by Mirna Mires, CNM     Nursing Staff Provider  Office Location  [facility] Dating    Language  English Anatomy US    Flu Vaccine   Genetic Screen  NIPS:   TDaP vaccine    Hgb A1C or  GTT Early : Third trimester :   Covid    LAB RESULTS   Rhogam   Blood Type A/Positive/-- (08/03 1540)   Feeding Plan Breast Antibody Negative (08/03 1540)  Contraception  Rubella 2.54 (08/03 1540)  Circumcision  RPR Non Reactive (08/03 1540)   Pediatrician   HBsAg Negative (08/03 1540)   Support Person Derrick HIV Non Reactive (08/03 1540)  Prenatal Classes  Varicella     GBS  (For PCN allergy, check sensitivities)   BTL Consent     VBAC Consent 2 svd's followed by 2 c/s's- desires VBAC Pap  2020 negative    Hgb Electro    Pelvis Tested  CF      SMA                   Preterm labor symptoms and general obstetric precautions including but not limited to vaginal bleeding, contractions, leaking of fluid and fetal movement were reviewed in detail with the patient. Please refer to After Visit Summary for other counseling recommendations.   Return in about 4 weeks (around 10/30/2020) for return OB.  Mirna Mires, CNM  10/02/2020 2:38 PM

## 2020-10-18 ENCOUNTER — Other Ambulatory Visit: Payer: Self-pay

## 2020-10-21 ENCOUNTER — Emergency Department
Admission: EM | Admit: 2020-10-21 | Discharge: 2020-10-21 | Disposition: A | Discharge status: Home / Self Care | Payer: Medicaid Other | Attending: Student in an Organized Health Care Education/Training Program | Admitting: Student in an Organized Health Care Education/Training Program

## 2020-10-21 ENCOUNTER — Other Ambulatory Visit: Payer: Self-pay

## 2020-10-21 DIAGNOSIS — R519 Headache, unspecified: Secondary | ICD-10-CM | POA: Insufficient documentation

## 2020-10-21 DIAGNOSIS — J029 Acute pharyngitis, unspecified: Secondary | ICD-10-CM | POA: Diagnosis not present

## 2020-10-21 LAB — GROUP A STREP BY PCR: Group A Strep by PCR: NOT DETECTED

## 2020-10-21 NOTE — Discharge Instructions (Addendum)
Your exam and labs do not confirm strep throat at this time. You may take OTC Tylenol along with Benadryl for headache pain relief. You may also mix equal parts of liquid Benadryl & Maalox/Mylanta to gargle for sore throat pain relief. Follow-up with your provider for continued symptoms.

## 2020-10-21 NOTE — ED Triage Notes (Signed)
Pt comes pov with sore throat, headache since yesterday. [redacted] weeks pregnant.

## 2020-10-23 ENCOUNTER — Other Ambulatory Visit: Payer: Self-pay

## 2020-10-23 ENCOUNTER — Ambulatory Visit: Payer: Medicaid Other | Attending: Obstetrics & Gynecology

## 2020-10-23 DIAGNOSIS — O99211 Obesity complicating pregnancy, first trimester: Secondary | ICD-10-CM

## 2020-10-23 DIAGNOSIS — Z3A19 19 weeks gestation of pregnancy: Secondary | ICD-10-CM | POA: Diagnosis not present

## 2020-10-23 DIAGNOSIS — O09292 Supervision of pregnancy with other poor reproductive or obstetric history, second trimester: Secondary | ICD-10-CM | POA: Diagnosis not present

## 2020-10-23 DIAGNOSIS — O99212 Obesity complicating pregnancy, second trimester: Secondary | ICD-10-CM | POA: Diagnosis not present

## 2020-10-23 DIAGNOSIS — Z8759 Personal history of other complications of pregnancy, childbirth and the puerperium: Secondary | ICD-10-CM | POA: Diagnosis not present

## 2020-10-23 DIAGNOSIS — E669 Obesity, unspecified: Secondary | ICD-10-CM | POA: Diagnosis not present

## 2020-10-23 DIAGNOSIS — Z8673 Personal history of transient ischemic attack (TIA), and cerebral infarction without residual deficits: Secondary | ICD-10-CM | POA: Insufficient documentation

## 2020-10-23 NOTE — ED Provider Notes (Signed)
[facility] Emergency Department Provider Note ____________________________________________  Time seen: 1825  I have reviewed the triage vital signs and the nursing notes.  HISTORY  Chief Complaint  Sore Throat   HPI Jessica Randall is a 25 y.o. female presents to the ED with complaints of sore throat and headache since yesterday.  Patient is [redacted] weeks pregnant with a single pregnancy.  She denies any pregnancy related complaints at this time.  Past Medical History:  Diagnosis Date   Anemia    Anxiety and depression    history of SI   Cholelithiasis affecting pregnancy in third trimester, antepartum 05/12/2017   GERD (gastroesophageal reflux disease)    History of drug use    MJ   History of gonorrhea    with G2   Pain in symphysis pubis during pregnancy 01/30/2017   Stroke-like symptoms 03/28/2015   TIA (transient ischemic attack) 03/28/2015   6 days after delivery of baby    Patient Active Problem List   Diagnosis Date Noted   Obesity affecting pregnancy in first trimester 08/28/2020   Hx of preeclampsia, prior pregnancy, currently pregnant, first trimester 08/28/2020   History of cesarean delivery 06/12/2019   Supervision of high risk pregnancy in first trimester 11/10/2018   History of cesarean section complicating pregnancy 06/18/2017    Past Surgical History:  Procedure Laterality Date   CESAREAN SECTION N/A 06/18/2017   Procedure: CESAREAN SECTION;  Surgeon: Natale Milch, MD;  Location: [facility] ORS;  Service: Obstetrics;  Laterality: N/A;   CESAREAN SECTION N/A 06/13/2019   Procedure: CESAREAN SECTION;  Surgeon: Natale Milch, MD;  Location: [facility] ORS;  Service: Obstetrics;  Laterality: N/A;   CHOLECYSTECTOMY Right 05/15/2017    Prior to Admission medications   Medication Sig Start Date End Date Taking? Authorizing Provider  enoxaparin (LOVENOX) 40 MG/0.4ML injection Inject 0.4 mLs (40 mg total) into the skin daily for 21 days.  06/23/19 08/09/19  Natale Milch, MD    Allergies Patient has no known allergies.  Family History  Problem Relation Age of Onset   Lupus Mother    Hypertension Mother    Hyperthyroidism Mother    Hyperlipidemia Mother    Hypertension Maternal Grandmother    Diabetes Maternal Grandmother    Transient ischemic attack Maternal Grandmother    Hyperlipidemia Maternal Grandfather    Breast cancer Paternal Grandmother    Transient ischemic attack Paternal Grandmother     Social History Social History   Tobacco Use   Smoking status: Never   Smokeless tobacco: Never  Vaping Use   Vaping Use: Never used  Substance Use Topics   Alcohol use: No    Alcohol/week: 0.0 standard drinks   Drug use: No    Comment: h/o THC use    Review of Systems  Constitutional: Negative for fever. Eyes: Negative for visual changes. ENT: Positive for sore throat. Cardiovascular: Negative for chest pain. Respiratory: Negative for shortness of breath. Gastrointestinal: Negative for abdominal pain, vomiting and diarrhea. Genitourinary: Negative for dysuria. Musculoskeletal: Negative for back pain. Skin: Negative for rash. Neurological: Negative for headaches, focal weakness or numbness. ____________________________________________  PHYSICAL EXAM:  VITAL SIGNS: ED Triage Vitals  Enc Vitals Group     BP 10/21/20 1623 117/69     Pulse Rate 10/21/20 1623 91     Resp 10/21/20 1623 18     Temp 10/21/20 1623 98.5 F (36.9 C)     Temp Source 10/21/20 1623 Oral  SpO2 10/21/20 1623 100 %     Weight 10/21/20 1504 193 lb (87.5 kg)     Height 10/21/20 1504 5\' 1"  (1.549 m)     Head Circumference --      Peak Flow --      Pain Score 10/21/20 1503 6     Pain Loc --      Pain Edu? --      Excl. in GC? --     Constitutional: Alert and oriented. Well appearing and in no distress. Head: Normocephalic and atraumatic. Eyes: Conjunctivae are normal. PERRL. Normal extraocular movements Ears:  Canals clear. TMs intact bilaterally. Nose: No congestion/rhinorrhea/epistaxis. Mouth/Throat: Mucous membranes are moist.  Uvula is midline and tonsils are enlarged but without erythema or exudates. Neck: Supple. No thyromegaly. Hematological/Lymphatic/Immunological: No cervical lymphadenopathy. Cardiovascular: Normal rate, regular rhythm. Normal distal pulses. Respiratory: Normal respiratory effort. No wheezes/rales/rhonchi. Gastrointestinal: Soft and nontender. No distention. Musculoskeletal: Nontender with normal range of motion in all extremities.  Neurologic:  Normal gait without ataxia. Normal speech and language. No gross focal neurologic deficits are appreciated. Skin:  Skin is warm, dry and intact. No rash noted. ____________________________________________    {LABS (pertinent positives/negatives)  Labs Reviewed  GROUP A STREP BY PCR  ___________________________________________  {EKG  ____________________________________________   RADIOLOGY Official radiology report(s):  PROCEDURES  Procedures ____________________________________________   INITIAL IMPRESSION / ASSESSMENT AND PLAN / ED COURSE  As part of my medical decision making, I reviewed the following data within the electronic MEDICAL RECORD NUMBER Labs reviewed WNL and Notes from prior ED visits   DDX: strep pharyngitis, viral pharyngitis, viral URI  Patient ED evaluation of sore throat and headache without subsequent fever.  Patient was evaluated for complaints, found have a negative strep screen at this time.  No airway compromise or enlarged tonsils noted.  Should be treated with Tylenol and over-the-counter Benadryl for headache pain relief as well as liquid Benadryl and Maalox mix for topical throat relief.  She will follow with primary vita for ongoing symptoms.  Return precautions of been reviewed.  Jessica Randall was evaluated in Emergency Department on 10/23/2020 for the symptoms described in the history of  present illness. She was evaluated in the context of the global COVID-19 pandemic, which necessitated consideration that the patient might be at risk for infection with the SARS-CoV-2 virus that causes COVID-19. Institutional protocols and algorithms that pertain to the evaluation of patients at risk for COVID-19 are in a state of rapid change based on information released by regulatory bodies including the CDC and federal and state organizations. These policies and algorithms were followed during the patient's care in the ED. ____________________________________________  FINAL CLINICAL IMPRESSION(S) / ED DIAGNOSES  Final diagnoses:  Sore throat      Daysie Helf, 10/25/2020, PA-C 10/23/20 2008    2009, MD 10/24/20 [phone]

## 2020-10-25 ENCOUNTER — Emergency Department
Admission: EM | Admit: 2020-10-25 | Discharge: 2020-10-25 | Disposition: A | Discharge status: Home / Self Care | Payer: Medicaid Other | Attending: Emergency Medicine | Admitting: Emergency Medicine

## 2020-10-25 ENCOUNTER — Encounter: Payer: Self-pay | Admitting: Emergency Medicine

## 2020-10-25 ENCOUNTER — Other Ambulatory Visit: Payer: Self-pay

## 2020-10-25 ENCOUNTER — Telehealth: Payer: Self-pay

## 2020-10-25 DIAGNOSIS — Z8673 Personal history of transient ischemic attack (TIA), and cerebral infarction without residual deficits: Secondary | ICD-10-CM | POA: Diagnosis not present

## 2020-10-25 DIAGNOSIS — O99512 Diseases of the respiratory system complicating pregnancy, second trimester: Secondary | ICD-10-CM | POA: Diagnosis not present

## 2020-10-25 DIAGNOSIS — Z3A19 19 weeks gestation of pregnancy: Secondary | ICD-10-CM | POA: Insufficient documentation

## 2020-10-25 DIAGNOSIS — Z20822 Contact with and (suspected) exposure to covid-19: Secondary | ICD-10-CM | POA: Diagnosis not present

## 2020-10-25 DIAGNOSIS — J069 Acute upper respiratory infection, unspecified: Secondary | ICD-10-CM

## 2020-10-25 LAB — RESP PANEL BY RT-PCR (FLU A&B, COVID) ARPGX2
Influenza A by PCR: NEGATIVE
Influenza B by PCR: NEGATIVE
SARS Coronavirus 2 by RT PCR: NEGATIVE

## 2020-10-25 NOTE — ED Provider Notes (Signed)
[facility] Emergency Department Provider Note  ____________________________________________   None    (approximate)  I have reviewed the triage vital signs and the nursing notes.   HISTORY  Chief Complaint Other (Viral symptoms/)   HPI Jessica Randall is a 25 y.o. female currently proximately [redacted] weeks pregnant with past medical history of TIA, GERD, cholestasis anxiety and anemia who presents for assessment approximately 1 week of congestion, sore throat and cough.  She has not had any fevers, back pain, chest pain, shortness of breath, vomiting, diarrhea, urinary symptoms, contractions, vaginal bleeding or discharge or any other clear associated sick symptoms.  States she did have a strep test a couple days ago but that was negative but has not been tested for flu or COVID.  She has been taking Tylenol.  No other acute concerns at this time.          Past Medical History:  Diagnosis Date   Anemia    Anxiety and depression    history of SI   Cholelithiasis affecting pregnancy in third trimester, antepartum 05/12/2017   GERD (gastroesophageal reflux disease)    History of drug use    MJ   History of gonorrhea    with G2   Pain in symphysis pubis during pregnancy 01/30/2017   Stroke-like symptoms 03/28/2015   TIA (transient ischemic attack) 03/28/2015   6 days after delivery of baby    Patient Active Problem List   Diagnosis Date Noted   Obesity affecting pregnancy in first trimester 08/28/2020   Hx of preeclampsia, prior pregnancy, currently pregnant, first trimester 08/28/2020   History of cesarean delivery 06/12/2019   Supervision of high risk pregnancy in first trimester 11/10/2018   History of cesarean section complicating pregnancy 06/18/2017    Past Surgical History:  Procedure Laterality Date   CESAREAN SECTION N/A 06/18/2017   Procedure: CESAREAN SECTION;  Surgeon: Natale Milch, MD;  Location: [facility] ORS;  Service: Obstetrics;   Laterality: N/A;   CESAREAN SECTION N/A 06/13/2019   Procedure: CESAREAN SECTION;  Surgeon: Natale Milch, MD;  Location: [facility] ORS;  Service: Obstetrics;  Laterality: N/A;   CHOLECYSTECTOMY Right 05/15/2017    Prior to Admission medications   Medication Sig Start Date End Date Taking? Authorizing Provider  enoxaparin (LOVENOX) 40 MG/0.4ML injection Inject 0.4 mLs (40 mg total) into the skin daily for 21 days. 06/23/19 08/09/19  Natale Milch, MD    Allergies Patient has no known allergies.  Family History  Problem Relation Age of Onset   Lupus Mother    Hypertension Mother    Hyperthyroidism Mother    Hyperlipidemia Mother    Hypertension Maternal Grandmother    Diabetes Maternal Grandmother    Transient ischemic attack Maternal Grandmother    Hyperlipidemia Maternal Grandfather    Breast cancer Paternal Grandmother    Transient ischemic attack Paternal Grandmother     Social History Social History   Tobacco Use   Smoking status: Never   Smokeless tobacco: Never  Vaping Use   Vaping Use: Never used  Substance Use Topics   Alcohol use: No    Alcohol/week: 0.0 standard drinks   Drug use: No    Comment: h/o THC use    Review of Systems  Review of Systems  Constitutional:  Negative for chills and fever.  HENT:  Positive for congestion and sore throat.   Eyes:  Negative for pain.  Respiratory:  Positive for cough. Negative for stridor.  Cardiovascular:  Negative for chest pain.  Gastrointestinal:  Positive for abdominal pain (some crampyness only when coughing). Negative for vomiting.  Genitourinary:  Negative for dysuria.  Musculoskeletal:  Negative for myalgias.  Skin:  Negative for rash.  Neurological:  Negative for seizures, loss of consciousness and headaches.  Psychiatric/Behavioral:  Negative for suicidal ideas.   All other systems reviewed and are negative.    ____________________________________________   PHYSICAL EXAM:  VITAL  SIGNS: ED Triage Vitals  Enc Vitals Group     BP 10/25/20 1649 126/74     Pulse Rate 10/25/20 1649 89     Resp 10/25/20 1649 20     Temp 10/25/20 1649 98.1 F (36.7 C)     Temp Source 10/25/20 1649 Oral     SpO2 10/25/20 1649 100 %     Weight 10/25/20 1650 193 lb (87.5 kg)     Height 10/25/20 1650 5\' 1"  (1.549 m)     Head Circumference --      Peak Flow --      Pain Score 10/25/20 1649 10     Pain Loc --      Pain Edu? --      Excl. in GC? --    Vitals:   10/25/20 1649  BP: 126/74  Pulse: 89  Resp: 20  Temp: 98.1 F (36.7 C)  SpO2: 100%   Physical Exam Vitals and nursing note reviewed.  Constitutional:      General: She is not in acute distress.    Appearance: She is well-developed.  HENT:     Head: Normocephalic and atraumatic.     Right Ear: External ear normal.     Left Ear: External ear normal.     Nose: Nose normal.     Mouth/Throat:     Mouth: Mucous membranes are moist.     Pharynx: No posterior oropharyngeal erythema.  Eyes:     Conjunctiva/sclera: Conjunctivae normal.  Cardiovascular:     Rate and Rhythm: Normal rate and regular rhythm.     Pulses: Normal pulses.     Heart sounds: No murmur heard. Pulmonary:     Effort: Pulmonary effort is normal. No respiratory distress.     Breath sounds: Normal breath sounds.  Abdominal:     Palpations: Abdomen is soft.     Tenderness: There is no abdominal tenderness.  Musculoskeletal:     Cervical back: Neck supple.  Skin:    General: Skin is warm and dry.     Capillary Refill: Capillary refill takes less than 2 seconds.  Neurological:     Mental Status: She is alert and oriented to person, place, and time.  Psychiatric:        Mood and Affect: Mood normal.    Some mild posterior oropharyngeal erythema.  No uvular deviation, tonsillar enlargement or stridor over the neck.  Lungs clear bilaterally.  Abdomen soft nontender throughout.  No CVA tenderness. ____________________________________________    LABS (all labs ordered are listed, but only abnormal results are displayed)  Labs Reviewed  RESP PANEL BY RT-PCR (FLU A&B, COVID) [id number]   ____________________________________________  EKG  ____________________________________________  RADIOLOGY  ED MD interpretation:    Official radiology report(s): No results found.  ____________________________________________   PROCEDURES  Procedure(s) performed (including Critical Care):  Procedures   ____________________________________________   INITIAL IMPRESSION / ASSESSMENT AND PLAN / ED COURSE     Presents with above-stated history exam for assessment approximate 7 days of some congestion sore throat and  cough.  On arrival she is afebrile hemodynamically stable.  She has not had any associated fevers shortness of breath chest pain has had a little soreness in her abdomen when she is coughing.  Abdomen is soft nontender throughout and she has no urinary symptoms GI symptoms and at this time I have a low suspicion for sepsis, meningitis, deep space infection head or neck, bacterial pneumonia or serious invasive bacterial infection in the abdomen or acute complication of her pregnancy.  Presentation is most consistent likely acute viral upper respiratory illness.  Send a flu and COVID.  Given reassuring vitals and exam I think she is stable for close outpatient follow-up.  Advise she can follow-up her COVID result online.  Discharged stable condition.  Strict return precautions advised and discussed.       ____________________________________________   FINAL CLINICAL IMPRESSION(S) / ED DIAGNOSES  Final diagnoses:  Upper respiratory tract infection, unspecified type    Medications - No data to display   ED Discharge Orders     None        Note:  This document was prepared using Dragon voice recognition software and may include unintentional dictation errors.    Gilles Chiquito, MD 10/25/20 [phone]

## 2020-10-25 NOTE — Telephone Encounter (Signed)
Pt calling; wants to know what to take - hurts real bad to cough; has green mucus.  [phone]  Pt was seen in ED on the 18th - tested for strep - neg; was not tested for covid; one of her children has had RSV and the other a real bad ear infection recently; she has coughed so much it hurts her chest and bottom of her stomach to cough; pt also has a H/A; doesn't feel well at all.  Adv since has gotten worse since being seen in the ED to be seen by her PCP b/c if we see her ins will not pay and she will have to pay out of pocket.

## 2020-10-25 NOTE — ED Triage Notes (Signed)
Pt here for body ahces, headache, congestion, cough, and loss of smell.  Pt was seen Saturday and dx with cold. No increased WOB.  Ambulatory. Pt is pregnant

## 2020-10-30 ENCOUNTER — Encounter: Payer: Medicaid Other | Admitting: Obstetrics

## 2020-11-05 ENCOUNTER — Encounter: Payer: Medicaid Other | Admitting: Obstetrics

## 2020-11-06 ENCOUNTER — Other Ambulatory Visit: Payer: Self-pay

## 2020-11-06 ENCOUNTER — Other Ambulatory Visit (HOSPITAL_COMMUNITY)
Admission: RE | Admit: 2020-11-06 | Discharge: 2020-11-06 | Disposition: A | Discharge status: Home / Self Care | Payer: Medicaid Other | Source: Ambulatory Visit | Attending: Obstetrics | Admitting: Obstetrics

## 2020-11-06 ENCOUNTER — Ambulatory Visit (INDEPENDENT_AMBULATORY_CARE_PROVIDER_SITE_OTHER): Payer: Medicaid Other | Admitting: Obstetrics

## 2020-11-06 VITALS — BP 110/70 | Wt 187.0 lb

## 2020-11-06 DIAGNOSIS — R399 Unspecified symptoms and signs involving the genitourinary system: Secondary | ICD-10-CM | POA: Diagnosis present

## 2020-11-06 DIAGNOSIS — N898 Other specified noninflammatory disorders of vagina: Secondary | ICD-10-CM | POA: Diagnosis present

## 2020-11-06 DIAGNOSIS — O26892 Other specified pregnancy related conditions, second trimester: Secondary | ICD-10-CM | POA: Insufficient documentation

## 2020-11-06 DIAGNOSIS — Z3480 Encounter for supervision of other normal pregnancy, unspecified trimester: Secondary | ICD-10-CM

## 2020-11-06 DIAGNOSIS — Z3A21 21 weeks gestation of pregnancy: Secondary | ICD-10-CM

## 2020-11-06 LAB — POCT URINALYSIS DIPSTICK OB
Bilirubin, UA: NEGATIVE
Blood, UA: NEGATIVE
Glucose, UA: NEGATIVE
Nitrite, UA: NEGATIVE
Spec Grav, UA: 1.025 (ref 1.010–1.025)
Urobilinogen, UA: 0.2 E.U./dL
pH, UA: 5 (ref 5.0–8.0)

## 2020-11-06 MED ORDER — NITROFURANTOIN MONOHYD MACRO 100 MG PO CAPS: 100.0000 mg | ORAL_CAPSULE | Freq: Two times a day (BID) | ORAL | 0 refills | Status: AC

## 2020-11-06 NOTE — Progress Notes (Signed)
Urine is sent for culture. Patient is started on Macrobid. Presently [redacted] weeks gestation. Jessica Randall, CNM  11/06/2020 5:42 PM

## 2020-11-06 NOTE — Progress Notes (Signed)
Routine Prenatal Care Visit  Subjective  Jessica Randall is a 25 y.o. [id number] at [redacted]w[redacted]d being seen today for ongoing prenatal care.  She is currently monitored for the following issues for this high-risk pregnancy and has History of cesarean section complicating pregnancy; Supervision of high risk pregnancy in first trimester; History of cesarean delivery; Obesity affecting pregnancy in first trimester; and Hx of preeclampsia, prior pregnancy, currently pregnant, first trimester on their problem list.  ----------------------------------------------------------------------------------- Patient reports some malodorous discharge and UTI symptoms recently. She is also tearful today- overwhelmed by single parenting two other children.    .  .   Jessica Randall Fluid denies.  ----------------------------------------------------------------------------------- The following portions of the patient's history were reviewed and updated as appropriate: allergies, current medications, past family history, past medical history, past social history, past surgical history and problem list. Problem list updated.  Objective  Blood pressure 110/70, weight 187 lb (84.8 kg), last menstrual period 06/12/2020, currently breastfeeding. Pregravid weight 180 lb (81.6 kg) Total Weight Gain 7 lb (3.175 kg) Urinalysis: Urine Protein 4+  Urine Glucose Negative  Fetal Status:           General:  Alert, oriented and cooperative. Patient is in no acute distress.  Skin: Skin is warm and dry. No rash noted.   Cardiovascular: Normal heart rate noted  Respiratory: Normal respiratory effort, no problems with respiration noted  Abdomen: Soft, gravid, appropriate for gestational age.       Pelvic:  Cervical exam deferred        Extremities: Normal range of motion.     Mental Status: Normal mood and affect. Normal behavior. Normal judgment and thought content.   Assessment   25 y.o. [id number] at [redacted]w[redacted]d by  03/19/2021, by Last Menstrual  Period presenting for routine prenatal visit  Plan   pregnancy 5 Problems (from 08/28/20 to present)    Problem Noted Resolved   Hx of preeclampsia, prior pregnancy, currently pregnant, first trimester 08/28/2020 by Tresea Mall, CNM No   Supervision of high risk pregnancy in first trimester 11/10/2018 by Oswaldo Conroy, CNM No   Overview Addendum 10/02/2020  2:38 PM by Mirna Mires, CNM     Nursing Staff Provider  Office Location  [facility] Dating    Language  English Anatomy US    Flu Vaccine   Genetic Screen  NIPS:   TDaP vaccine    Hgb A1C or  GTT Early : Third trimester :   Covid    LAB RESULTS   Rhogam   Blood Type A/Positive/-- (08/03 1540)   Feeding Plan Breast Antibody Negative (08/03 1540)  Contraception  Rubella 2.54 (08/03 1540)  Circumcision  RPR Non Reactive (08/03 1540)   Pediatrician   HBsAg Negative (08/03 1540)   Support Person Derrick HIV Non Reactive (08/03 1540)  Prenatal Classes  Varicella     GBS  (For PCN allergy, check sensitivities)   BTL Consent     VBAC Consent 2 svd's followed by 2 c/s's- desires VBAC Pap  2020 negative    Hgb Electro    Pelvis Tested  CF      SMA                   Preterm labor symptoms and general obstetric precautions including but not limited to vaginal bleeding, contractions, leaking of fluid and fetal movement were reviewed in detail with the patient. Please refer to After Visit Summary for other counseling recommendations.  Her urine shows Leuks  and protein. Started on [employer], and urine sent for cx [name] support- will reach out to [name] for assistance.  Return in about 4 weeks (around 12/04/2020) for return OB, mood check.  Mirna Mires, CNM  11/06/2020 2:27 PM

## 2020-11-06 NOTE — Progress Notes (Signed)
ROB- sour discharge, lower back pain x since last week

## 2020-11-08 LAB — CERVICOVAGINAL ANCILLARY ONLY
Bacterial Vaginitis (gardnerella): POSITIVE — AB
Candida Glabrata: NEGATIVE
Candida Vaginitis: NEGATIVE
Chlamydia: NEGATIVE
Comment: NEGATIVE
Comment: NEGATIVE
Comment: NEGATIVE
Comment: NEGATIVE
Comment: NEGATIVE
Comment: NORMAL
Neisseria Gonorrhea: NEGATIVE
Trichomonas: NEGATIVE

## 2020-11-09 LAB — URINE CULTURE

## 2020-11-12 ENCOUNTER — Other Ambulatory Visit: Payer: Self-pay | Admitting: Obstetrics

## 2020-11-12 ENCOUNTER — Encounter: Payer: Self-pay | Admitting: Obstetrics

## 2020-11-12 DIAGNOSIS — B9689 Other specified bacterial agents as the cause of diseases classified elsewhere: Secondary | ICD-10-CM

## 2020-11-12 DIAGNOSIS — O0991 Supervision of high risk pregnancy, unspecified, first trimester: Secondary | ICD-10-CM

## 2020-11-12 DIAGNOSIS — N760 Acute vaginitis: Secondary | ICD-10-CM

## 2020-11-12 MED ORDER — METRONIDAZOLE 500 MG PO TABS: 500.0000 mg | ORAL_TABLET | Freq: Two times a day (BID) | ORAL | 0 refills | Status: AC

## 2020-11-12 MED ORDER — PRENATE MINI 18-0.6-0.4-350 MG PO CAPS: 1.0000 | ORAL_CAPSULE | Freq: Every day | ORAL | 11 refills | Status: AC

## 2020-11-12 NOTE — Progress Notes (Signed)
Nuswab reveals bacterial vaginosis.  Rx for metronidazole sent to her pharmacy.  Mirna Mires, CNM  11/12/2020 6:27 PM

## 2020-12-19 ENCOUNTER — Encounter: Payer: Medicaid Other | Admitting: Advanced Practice Midwife

## 2020-12-24 ENCOUNTER — Encounter: Payer: Medicaid Other | Admitting: Obstetrics

## 2020-12-25 ENCOUNTER — Other Ambulatory Visit: Payer: Self-pay

## 2020-12-25 DIAGNOSIS — O99213 Obesity complicating pregnancy, third trimester: Secondary | ICD-10-CM

## 2020-12-25 DIAGNOSIS — O09293 Supervision of pregnancy with other poor reproductive or obstetric history, third trimester: Secondary | ICD-10-CM

## 2020-12-25 DIAGNOSIS — Z98891 History of uterine scar from previous surgery: Secondary | ICD-10-CM

## 2021-01-01 ENCOUNTER — Other Ambulatory Visit: Payer: Self-pay

## 2021-01-01 ENCOUNTER — Ambulatory Visit: Payer: Medicaid Other | Attending: Maternal & Fetal Medicine

## 2021-01-01 DIAGNOSIS — O99213 Obesity complicating pregnancy, third trimester: Secondary | ICD-10-CM | POA: Diagnosis present

## 2021-01-01 DIAGNOSIS — Z3A29 29 weeks gestation of pregnancy: Secondary | ICD-10-CM

## 2021-01-01 DIAGNOSIS — E669 Obesity, unspecified: Secondary | ICD-10-CM | POA: Insufficient documentation

## 2021-01-01 DIAGNOSIS — O09293 Supervision of pregnancy with other poor reproductive or obstetric history, third trimester: Secondary | ICD-10-CM | POA: Diagnosis not present

## 2021-01-01 DIAGNOSIS — Z98891 History of uterine scar from previous surgery: Secondary | ICD-10-CM

## 2021-01-01 DIAGNOSIS — O34219 Maternal care for unspecified type scar from previous cesarean delivery: Secondary | ICD-10-CM

## 2021-01-03 ENCOUNTER — Encounter: Payer: Medicaid Other | Admitting: Obstetrics

## 2021-01-04 ENCOUNTER — Ambulatory Visit (INDEPENDENT_AMBULATORY_CARE_PROVIDER_SITE_OTHER): Payer: Medicaid Other | Admitting: Obstetrics and Gynecology

## 2021-01-04 ENCOUNTER — Other Ambulatory Visit: Payer: Self-pay

## 2021-01-04 VITALS — BP 120/76 | Wt 187.0 lb

## 2021-01-04 DIAGNOSIS — Z8673 Personal history of transient ischemic attack (TIA), and cerebral infarction without residual deficits: Secondary | ICD-10-CM

## 2021-01-04 DIAGNOSIS — Z23 Encounter for immunization: Secondary | ICD-10-CM | POA: Diagnosis not present

## 2021-01-04 DIAGNOSIS — O09291 Supervision of pregnancy with other poor reproductive or obstetric history, first trimester: Secondary | ICD-10-CM

## 2021-01-04 DIAGNOSIS — O99213 Obesity complicating pregnancy, third trimester: Secondary | ICD-10-CM

## 2021-01-04 DIAGNOSIS — O0993 Supervision of high risk pregnancy, unspecified, third trimester: Secondary | ICD-10-CM

## 2021-01-04 DIAGNOSIS — Z3A29 29 weeks gestation of pregnancy: Secondary | ICD-10-CM

## 2021-01-04 DIAGNOSIS — O34219 Maternal care for unspecified type scar from previous cesarean delivery: Secondary | ICD-10-CM

## 2021-01-04 DIAGNOSIS — Z369 Encounter for antenatal screening, unspecified: Secondary | ICD-10-CM

## 2021-01-04 MED ORDER — TRIAMCINOLONE ACETONIDE 0.1 % EX CREA: 1.0000 "application " | TOPICAL_CREAM | Freq: Two times a day (BID) | CUTANEOUS | 1 refills | Status: DC

## 2021-01-04 NOTE — Progress Notes (Signed)
ROB - no concerns. RM 5 

## 2021-01-04 NOTE — Progress Notes (Signed)
Routine Prenatal Care Visit  Subjective  Jessica Randall is a 25 y.o. [id number] at [redacted]w[redacted]d being seen today for ongoing prenatal care.  She is currently monitored for the following issues for this high-risk pregnancy and has History of cesarean section complicating pregnancy; Supervision of high risk pregnancy in first trimester; History of cesarean delivery; Obesity affecting pregnancy in first trimester; Hx of preeclampsia, prior pregnancy, currently pregnant, first trimester; and History of transient ischemic attack (TIA) on their problem list.  ----------------------------------------------------------------------------------- Patient reports no complaints.   Contractions: Irregular. Vag. Bleeding: None.  Movement: Present. Denies leaking of fluid.  ----------------------------------------------------------------------------------- The following portions of the patient's history were reviewed and updated as appropriate: allergies, current medications, past family history, past medical history, past social history, past surgical history and problem list. Problem list updated.   Objective  Blood pressure 120/76, weight 187 lb (84.8 kg), last menstrual period 06/12/2020, currently breastfeeding. Pregravid weight 180 lb (81.6 kg) Total Weight Gain 7 lb (3.175 kg) Body mass index is 35.33 kg/m.  Urinalysis:      Fetal Status: Fetal Heart Rate (bpm): 135 Fundal Height: 30 cm Movement: Present  Presentation: Vertex  General:  Alert, oriented and cooperative. Patient is in no acute distress.  Skin: Skin is warm and dry. No rash noted.   Cardiovascular: Normal heart rate noted  Respiratory: Normal respiratory effort, no problems with respiration noted  Abdomen: Soft, gravid, appropriate for gestational age. Pain/Pressure: Absent     Pelvic:  Cervical exam deferred        Extremities: Normal range of motion.     ental Status: Normal mood and affect. Normal behavior. Normal judgment and  thought content.    Edinburgh Postnatal Depression Scale - 01/04/21 0946       Edinburgh Postnatal Depression Scale:  In the Past 7 Days   I have been able to laugh and see the funny side of things. 0    I have looked forward with enjoyment to things. 0    I have blamed myself unnecessarily when things went wrong. 1    I have been anxious or worried for no good reason. 0    I have felt scared or panicky for no good reason. 1    Things have been getting on top of me. 1    I have been so unhappy that I have had difficulty sleeping. 0    I have felt sad or miserable. 0    I have been so unhappy that I have been crying. 1    The thought of harming myself has occurred to me. 0    Edinburgh Postnatal Depression Scale Total 4            Immunization History  Administered Date(s) Administered   Influenza,inj,Quad PF,6+ Mos 01/04/2021   Influenza-Unspecified 10/04/2016   Tdap 03/22/2015, 04/29/2019     Assessment   25 y.o. [id number] at [redacted]w[redacted]d by  03/19/2021, by Last Menstrual Period presenting for routine prenatal visit  Plan   pregnancy 5 Problems (from 08/28/20 to present)     Problem Noted Resolved   Hx of preeclampsia, prior pregnancy, currently pregnant, first trimester 08/28/2020 by Rod Can, CNM No   Supervision of high risk pregnancy in first trimester 11/10/2018 by Rexene Agent, CNM No   Overview Addendum 10/02/2020  2:38 PM by Imagene Riches, CNM     Nursing Staff Provider  Office Location  [facility] Dating  LMP = 11w [place]  Language  English Anatomy US  Normal at MFM  Flu Vaccine  01/04/2021 Genetic Screen  NIPS: Normal XX  TDaP vaccine    Hgb A1C or  GTT Early : Third trimester :   Covid    LAB RESULTS   Rhogam  N/A Blood Type A/Positive/-- (08/03 1540)   Feeding Plan Breast Antibody Negative (08/03 1540)  Contraception  Rubella 2.54 (08/03 1540)  Circumcision  RPR Non Reactive (08/03 1540)   Pediatrician   HBsAg Negative (08/03 1540)   Support Person  Derrick HIV Non Reactive (08/03 1540)  Prenatal Classes  Varicella Non-Immune    GBS  (For PCN allergy, check sensitivities)   BTL Consent     VBAC Consent 2 svd's followed by 2 c/s's- desires VBAC Pap  2020 negative    Hgb Electro  AA  Pelvis Tested 7lbs CF      SMA     HepC negative             Gestational age appropriate obstetric precautions including but not limited to vaginal bleeding, contractions, leaking of fluid and fetal movement were reviewed in detail with the patient.    - appropriate growth on 01/01/2021 - 28 week labs ordered   Return in about 2 weeks (around 01/18/2021) for ROB and 28 week labs.  Vena Austria, MD, [facility], [facility] 01/04/2021, 9:13 AM

## 2021-01-12 ENCOUNTER — Other Ambulatory Visit: Payer: Self-pay | Admitting: Obstetrics and Gynecology

## 2021-01-12 ENCOUNTER — Observation Stay: Payer: Medicaid Other

## 2021-01-12 ENCOUNTER — Encounter: Payer: Self-pay | Admitting: Obstetrics and Gynecology

## 2021-01-12 ENCOUNTER — Other Ambulatory Visit: Payer: Self-pay

## 2021-01-12 ENCOUNTER — Observation Stay
Admission: EM | Admit: 2021-01-12 | Discharge: 2021-01-12 | Disposition: A | Discharge status: Home / Self Care | Payer: Medicaid Other | Attending: Obstetrics and Gynecology | Admitting: Obstetrics and Gynecology

## 2021-01-12 DIAGNOSIS — Z79899 Other long term (current) drug therapy: Secondary | ICD-10-CM | POA: Insufficient documentation

## 2021-01-12 DIAGNOSIS — O0991 Supervision of high risk pregnancy, unspecified, first trimester: Secondary | ICD-10-CM

## 2021-01-12 DIAGNOSIS — R519 Headache, unspecified: Secondary | ICD-10-CM | POA: Diagnosis not present

## 2021-01-12 DIAGNOSIS — Z3A30 30 weeks gestation of pregnancy: Secondary | ICD-10-CM | POA: Diagnosis not present

## 2021-01-12 DIAGNOSIS — O99893 Other specified diseases and conditions complicating puerperium: Secondary | ICD-10-CM | POA: Insufficient documentation

## 2021-01-12 DIAGNOSIS — O09291 Supervision of pregnancy with other poor reproductive or obstetric history, first trimester: Secondary | ICD-10-CM | POA: Diagnosis not present

## 2021-01-12 DIAGNOSIS — E876 Hypokalemia: Secondary | ICD-10-CM | POA: Diagnosis not present

## 2021-01-12 DIAGNOSIS — Z8673 Personal history of transient ischemic attack (TIA), and cerebral infarction without residual deficits: Secondary | ICD-10-CM | POA: Insufficient documentation

## 2021-01-12 DIAGNOSIS — D649 Anemia, unspecified: Secondary | ICD-10-CM

## 2021-01-12 DIAGNOSIS — O99013 Anemia complicating pregnancy, third trimester: Secondary | ICD-10-CM | POA: Diagnosis not present

## 2021-01-12 DIAGNOSIS — O26893 Other specified pregnancy related conditions, third trimester: Principal | ICD-10-CM | POA: Diagnosis present

## 2021-01-12 DIAGNOSIS — O99283 Endocrine, nutritional and metabolic diseases complicating pregnancy, third trimester: Secondary | ICD-10-CM

## 2021-01-12 LAB — PROTEIN / CREATININE RATIO, URINE
Creatinine, Urine: 318 mg/dL
Protein Creatinine Ratio: 0.06 mg/mg{Cre} (ref 0.00–0.15)
Total Protein, Urine: 20 mg/dL

## 2021-01-12 LAB — COMPREHENSIVE METABOLIC PANEL
ALT: 13 U/L (ref 0–44)
AST: 15 U/L (ref 15–41)
Albumin: 2.8 g/dL — ABNORMAL LOW (ref 3.5–5.0)
Alkaline Phosphatase: 101 U/L (ref 38–126)
Anion gap: 8 (ref 5–15)
BUN: <5 mg/dL — ABNORMAL LOW (ref 6–20)
CO2: 22 mmol/L (ref 22–32)
Calcium: 8.6 mg/dL — ABNORMAL LOW (ref 8.9–10.3)
Chloride: 104 mmol/L (ref 98–111)
Creatinine, Ser: 0.39 mg/dL — ABNORMAL LOW (ref 0.44–1.00)
GFR, Estimated: >60 mL/min (ref >60)
Glucose, Bld: 87 mg/dL (ref 70–99)
Potassium: 2.8 mmol/L — ABNORMAL LOW (ref 3.5–5.1)
Sodium: 134 mmol/L — ABNORMAL LOW (ref 135–145)
Total Bilirubin: 0.6 mg/dL (ref 0.3–1.2)
Total Protein: 7 g/dL (ref 6.5–8.1)

## 2021-01-12 LAB — CBC
HCT: 28.4 % — ABNORMAL LOW (ref 36.0–46.0)
Hemoglobin: 9.7 g/dL — ABNORMAL LOW (ref 12.0–15.0)
MCH: 28 pg (ref 26.0–34.0)
MCHC: 34.2 g/dL (ref 30.0–36.0)
MCV: 82.1 fL (ref 80.0–100.0)
Platelets: 244 10*3/uL (ref 150–400)
RBC: 3.46 MIL/uL — ABNORMAL LOW (ref 3.87–5.11)
RDW: 12.1 % (ref 11.5–15.5)
WBC: 7.9 10*3/uL (ref 4.0–10.5)
nRBC: 0 % (ref 0.0–0.2)

## 2021-01-12 MED ORDER — PROCHLORPERAZINE MALEATE 10 MG PO TABS
10.0000 mg | ORAL_TABLET | Freq: Once | ORAL | Status: AC
  Administered 2021-01-12: 10 mg via ORAL
  Filled 2021-01-12: qty 1

## 2021-01-12 MED ORDER — POTASSIUM CHLORIDE 10 MEQ/100ML IV SOLN
10.0000 meq | INTRAVENOUS | Status: DC
  Administered 2021-01-12 (×2): 10 meq via INTRAVENOUS
  Filled 2021-01-12 (×4): qty 100

## 2021-01-12 MED ORDER — PROCHLORPERAZINE MALEATE 10 MG PO TABS: 10.0000 mg | ORAL_TABLET | Freq: Four times a day (QID) | ORAL | 0 refills | Status: DC | PRN

## 2021-01-12 MED ORDER — FERROUS GLUCONATE 240 (27 FE) MG PO TABS: 240.0000 mg | ORAL_TABLET | Freq: Every day | ORAL | 2 refills | Status: DC

## 2021-01-12 MED ORDER — ACETAMINOPHEN 325 MG PO TABS
650.0000 mg | ORAL_TABLET | ORAL | Status: DC | PRN
  Administered 2021-01-12: 650 mg via ORAL
  Filled 2021-01-12: qty 2

## 2021-01-12 MED ORDER — POTASSIUM CHLORIDE CRYS ER 10 MEQ PO TBCR
20.0000 meq | EXTENDED_RELEASE_TABLET | Freq: Once | ORAL | Status: AC
  Administered 2021-01-12: 20 meq via ORAL
  Filled 2021-01-12: qty 2

## 2021-01-12 MED ORDER — LACTATED RINGERS IV SOLN: INTRAVENOUS | Status: DC

## 2021-01-12 MED ORDER — MAGNESIUM OXIDE -MG SUPPLEMENT 400 (240 MG) MG PO TABS
400.0000 mg | ORAL_TABLET | Freq: Once | ORAL | Status: AC
  Administered 2021-01-12: 400 mg via ORAL
  Filled 2021-01-12: qty 1

## 2021-01-12 NOTE — OB Triage Note (Signed)
Pt discharged home in stable condition. Discharge instructions and when to return reviewed and pt verbalizes understanding. VSS.

## 2021-01-12 NOTE — Progress Notes (Signed)
Pt back from CT

## 2021-01-12 NOTE — OB Triage Note (Signed)
G5P4 [redacted]w[redacted]d presents with c/o HA and "burning in her eyes" since this morning. Pt reports taking tylenol and it helped her take a nap. Pt states headache is constant and rates 7/10. Pt reports blurry vision and light sensitivity. Denies epigastric pain, LOF, ctx. Reports +FM. Pt reports history of TIA and postpartum preE. VSS. Monitors applied.

## 2021-01-12 NOTE — H&P (Signed)
Obstetric H&P   Chief Complaint: Headache in pregnancy  Prenatal Care Provider: [facility]  History of Present Illness: 25 y.o. [id number] [redacted]w[redacted]d by 03/19/2021, by Last Menstrual Period presenting to L&D with new onset bilateral frontal headache as well as photophobia.  No nuchal rigidity, fevers, or chills.  No recent URI symptoms or sick contacts.  The patient does have a history of preeclampsia in prior pregnancy.  She checked her blood pressure at home which was normal.  Blood pressures were normal on presentation to triage as well.  Laboratory evaluation normal.  The patient does have a history of prior TIA.  She currently has not noted any other focal neurological deficits.  She was treated with Tylenol po, compazine, and magnesium without improvement in headache.     No labor complaints.  +FM, no LOF, no VB, no ctx.  Pregravid weight 81.6 kg Total Weight Gain 3.175 kg  pregnancy 5 Problems (from 08/28/20 to present)     Problem Noted Resolved   Hx of preeclampsia, prior pregnancy, currently pregnant, first trimester 08/28/2020 by Tresea Mall, CNM No   Supervision of high risk pregnancy in first trimester 11/10/2018 by Oswaldo Conroy, CNM No   Overview Addendum 01/12/2021  5:14 PM by Vena Austria, MD    Nursing Staff Provider  Office Location  [facility] Dating  LMP = 11w [place]  Language  English Anatomy US  Normal at MFM  Flu Vaccine  01/04/2021 Genetic Screen  NIPS: Normal XX  TDaP vaccine    Hgb A1C or  GTT Early : Third trimester :   Covid    LAB RESULTS   Rhogam  N/A Blood Type A/Positive/-- (08/03 1540)   Feeding Plan Breast Antibody Negative (08/03 1540)  Contraception  Rubella 2.54 (08/03 1540)  Circumcision  RPR Non Reactive (08/03 1540)   Pediatrician   HBsAg Negative (08/03 1540)   Support Person Derrick HIV Non Reactive (08/03 1540)  Prenatal Classes  Varicella Non-Immune    GBS  (For PCN allergy, check sensitivities)   BTL Consent     VBAC Consent 2 svd's followed  by 2 c/s's- desires VBAC Pap  2020 negative    Hgb Electro  AA  Pelvis Tested 7lbs CF      SMA     HepC negative            Review of Systems: 10 point review of systems negative unless otherwise noted in HPI  Past Medical History: Patient Active Problem List   Diagnosis Date Noted   History of transient ischemic attack (TIA) 01/12/2021   Headache in pregnancy, third trimester 01/12/2021   Obesity affecting pregnancy in first trimester 08/28/2020   Hx of preeclampsia, prior pregnancy, currently pregnant, first trimester 08/28/2020   History of cesarean delivery 06/12/2019   Supervision of high risk pregnancy in first trimester 11/10/2018    Nursing Staff Provider  Office Location  [facility] Dating  LMP = 11w [place]  Language  English Anatomy US  Normal at MFM  Flu Vaccine  01/04/2021 Genetic Screen  NIPS: Normal XX  TDaP vaccine    Hgb A1C or  GTT Early : Third trimester :   Covid    LAB RESULTS   Rhogam  N/A Blood Type A/Positive/-- (08/03 1540)   Feeding Plan Breast Antibody Negative (08/03 1540)  Contraception  Rubella 2.54 (08/03 1540)  Circumcision  RPR Non Reactive (08/03 1540)   Pediatrician   HBsAg Negative (08/03 1540)   Support Person Derrick HIV  Non Reactive (08/03 1540)  Prenatal Classes  Varicella Non-Immune    GBS  (For PCN allergy, check sensitivities)   BTL Consent     VBAC Consent 2 svd's followed by 2 c/s's- desires VBAC Pap  2020 negative    Hgb Electro  AA  Pelvis Tested 7lbs CF      SMA     HepC negative      History of cesarean section complicating pregnancy 06/18/2017    Past Surgical History: Past Surgical History:  Procedure Laterality Date   CESAREAN SECTION N/A 06/18/2017   Procedure: CESAREAN SECTION;  Surgeon: Natale Milch, MD;  Location: [facility] ORS;  Service: Obstetrics;  Laterality: N/A;   CESAREAN SECTION N/A 06/13/2019   Procedure: CESAREAN SECTION;  Surgeon: Natale Milch, MD;  Location: [facility] OR[facility];  Service: Obstetrics;   Laterality: N/A;   CHOLECYSTECTOMY Right 05/15/2017    Past Obstetric History: # 1 - Date: 01/24/12, Sex: Female, Weight: 3175 g, GA: [redacted]w[redacted]d, Delivery: Vaginal, Spontaneous, Apgar1: None, Apgar5: None, Living: Living, Birth Comments: None  # 2 - Date: 03/21/15, Sex: Female, Weight: 3090 g, GA: [redacted]w[redacted]d, Delivery: Vaginal, Spontaneous, Apgar1: None, Apgar5: None, Living: Living, Birth Comments: None  # 3 - Date: 06/18/17, Sex: Female, Weight: 2650 g, GA: [redacted]w[redacted]d, Delivery: C-Section, Low Transverse, Apgar1: 8, Apgar5: 9, Living: Living, Birth Comments: None  # 4 - Date: 06/13/19, Sex: Female, Weight: 3180 g, GA: [redacted]w[redacted]d, Delivery: C-Section, Low Transverse, Apgar1: 9, Apgar5: 9, Living: Living, Birth Comments: extra digit on each hand  # 5 - Date: None, Sex: None, Weight: None, GA: None, Delivery: None, Apgar1: None, Apgar5: None, Living: None, Birth Comments: None   Past Gynecologic History:  Family History: Family History  Problem Relation Age of Onset   Lupus Mother    Hypertension Mother    Hyperthyroidism Mother    Hyperlipidemia Mother    Hypertension Maternal Grandmother    Diabetes Maternal Grandmother    Transient ischemic attack Maternal Grandmother    Hyperlipidemia Maternal Grandfather    Breast cancer Paternal Grandmother    Transient ischemic attack Paternal Grandmother     Social History: Social History   Socioeconomic History   Marital status: Single    Spouse name: Tyrone   Number of children: 3   Years of education: 9   Highest education level: 9th grade  Occupational History   Occupation: food Designer, multimedia: [employer]   Occupation: Unemployed  Tobacco Use   Smoking status: Never   Smokeless tobacco: Never  Building services engineer Use: Never used  Substance and Sexual Activity   Alcohol use: No    Alcohol/week: 0.0 standard drinks   Drug use: No    Comment: h/o THC use   Sexual activity: Yes    Birth control/protection: Surgical, Injection    Comment:  PLANNING SHOT  Other Topics Concern   Not on file  Social History Narrative   Not on file   Social Determinants of Health   Financial Resource Strain: Not on file  Food Insecurity: Not on file  Transportation Needs: Not on file  Physical Activity: Not on file  Stress: Not on file  Social Connections: Not on file  Intimate Partner Violence: Not on file    Medications: Prior to Admission medications   Medication Sig Start Date End Date Taking? Authorizing Provider  Prenat-FeCbn-FeAsp-Meth-FA-DHA (PRENATE MINI) 18-0.6-0.4-350 MG CAPS Take 1 capsule by mouth daily. 11/12/20  Yes Mirna Mires, CNM  triamcinolone cream (  KENALOG) 0.1 % Apply 1 application topically 2 (two) times daily. 01/04/21   Vena Austria, MD  enoxaparin (LOVENOX) 40 MG/0.4ML injection Inject 0.4 mLs (40 mg total) into the skin daily for 21 days. 06/23/19 08/09/19  Natale Milch, MD    Allergies: No Known Allergies  Physical Exam: Vitals: Blood pressure (!) 106/55, pulse 80, temperature 98.3 F (36.8 C), temperature source Oral, resp. rate 16, last menstrual period 06/12/2020, currently breastfeeding.  FHT: 135, moderate, +accels, no decels Toco: none  General: NAD HEENT: normocephalic, anicteric, no nuchal rigidity Pulmonary: No increased work of breathing Abdomen: Gravid, non-tender Genitourinary: deferred Extremities: no edema, erythema, or tenderness Neurologic: Grossly intact Psychiatric: mood appropriate, affect full  Labs: Results for orders placed or performed during the hospital encounter of 01/12/21 (from the past 24 hour(s))  Protein / creatinine ratio, urine     Status: None   Collection Time: 01/12/21  6:14 PM  Result Value Ref Range   Creatinine, Urine 318 mg/dL   Total Protein, Urine 20 mg/dL   Protein Creatinine Ratio 0.06 0.00 - 0.15 mg/mg[Cre]  CBC     Status: Abnormal   Collection Time: 01/12/21  7:23 PM  Result Value Ref Range   WBC 7.9 4.0 - 10.5 K/uL   RBC 3.46  (L) 3.87 - 5.11 MIL/uL   Hemoglobin 9.7 (L) 12.0 - 15.0 g/dL   HCT 31.5 (L) 40.0 - 86.7 %   MCV 82.1 80.0 - 100.0 fL   MCH 28.0 26.0 - 34.0 pg   MCHC 34.2 30.0 - 36.0 g/dL   RDW 61.9 50.9 - 32.6 %   Platelets 244 150 - 400 K/uL   nRBC 0.0 0.0 - 0.2 %  Comprehensive metabolic panel     Status: Abnormal   Collection Time: 01/12/21  7:23 PM  Result Value Ref Range   Sodium 134 (L) 135 - 145 mmol/L   Potassium 2.8 (L) 3.5 - 5.1 mmol/L   Chloride 104 98 - 111 mmol/L   CO2 22 22 - 32 mmol/L   Glucose, Bld 87 70 - 99 mg/dL   BUN <5 (L) 6 - 20 mg/dL   Creatinine, Ser 7.12 (L) 0.44 - 1.00 mg/dL   Calcium 8.6 (L) 8.9 - 10.3 mg/dL   Total Protein 7.0 6.5 - 8.1 g/dL   Albumin 2.8 (L) 3.5 - 5.0 g/dL   AST 15 15 - 41 U/L   ALT 13 0 - 44 U/L   Alkaline Phosphatase 101 38 - 126 U/L   Total Bilirubin 0.6 0.3 - 1.2 mg/dL   GFR, Estimated >45 >80 mL/min   Anion gap 8 5 - 15    Assessment: 25 y.o. [id number] [redacted]w[redacted]d by 03/19/2021, by Last Menstrual Period presenting with   Plan: 1) Headache - unrelieved with medications.  Given persistent nature as well as history of prior TIA and otherwise negative work up will proceed with head CT.  2) Fetus - cat I tracing  3) PNL - Blood type A/Positive/-- (08/03 1540) / Anti-bodyscreen Negative (08/03 1540) / Rubella 2.54 (08/03 1540) / Varicella Non-Immune / RPR Non Reactive (08/03 1540) / HBsAg Negative (08/03 1540) / HIV Non Reactive (08/03 1540) / 1-hr OGTT pending / GBS unkown  4) Anemia - will arrange for outpatient hematology referral  5) Immunization History -  Immunization History  Administered Date(s) Administered   Influenza,inj,Quad PF,6+ Mos 01/04/2021   Influenza-Unspecified 10/04/2016   Tdap 03/22/2015, 04/29/2019    5) Disposition - pending headache work up  Vena Austria, MD, [facility], [facility] 01/12/2021, 8:15 PM

## 2021-01-12 NOTE — Discharge Summary (Signed)
Physician Final Progress Note  Patient ID: Jessica Randall MRN: [id number] DOB/AGE: 01-Mar-1995 25 y.o.  Admit date: 01/12/2021 Admitting provider: Vena Austria, MD Discharge date: 01/12/2021   Admission Diagnoses: Headache in pregnancy third trimester  Discharge Diagnoses:  Principal Problem:   Headache in pregnancy, third trimester Active Problems:   Hypokalemia   Anemia affecting pregnancy in third trimester   25 y.o. [id number] at [redacted]w[redacted]d by Estimated Date of Delivery: 03/19/21 presenting with new onset headache in setting of prior history of preeclampsia in prior pregnancy, as well as prior TIA.  BP normotensive, labs negative for preeclampsia but notable for anemia and hypokalemia.  Patient treated with compazine, tylenol, and magnesium po without improvement.  Head CT ordered and negative.  Patient had potassium replete two runs of IV and 20 mEq po.  She requested discharge and will be set up with outpatient hematology referral secondary to anemia.  Consults: None  Significant Findings/ Diagnostic Studies:  Results for orders placed or performed during the hospital encounter of 01/12/21 (from the past 24 hour(s))  Protein / creatinine ratio, urine     Status: None   Collection Time: 01/12/21  6:14 PM  Result Value Ref Range   Creatinine, Urine 318 mg/dL   Total Protein, Urine 20 mg/dL   Protein Creatinine Ratio 0.06 0.00 - 0.15 mg/mg[Cre]  CBC     Status: Abnormal   Collection Time: 01/12/21  7:23 PM  Result Value Ref Range   WBC 7.9 4.0 - 10.5 K/uL   RBC 3.46 (L) 3.87 - 5.11 MIL/uL   Hemoglobin 9.7 (L) 12.0 - 15.0 g/dL   HCT 30.8 (L) 65.7 - 84.6 %   MCV 82.1 80.0 - 100.0 fL   MCH 28.0 26.0 - 34.0 pg   MCHC 34.2 30.0 - 36.0 g/dL   RDW 96.2 95.2 - 84.1 %   Platelets 244 150 - 400 K/uL   nRBC 0.0 0.0 - 0.2 %  Comprehensive metabolic panel     Status: Abnormal   Collection Time: 01/12/21  7:23 PM  Result Value Ref Range   Sodium 134 (L) 135 - 145 mmol/L    Potassium 2.8 (L) 3.5 - 5.1 mmol/L   Chloride 104 98 - 111 mmol/L   CO2 22 22 - 32 mmol/L   Glucose, Bld 87 70 - 99 mg/dL   BUN <5 (L) 6 - 20 mg/dL   Creatinine, Ser 3.24 (L) 0.44 - 1.00 mg/dL   Calcium 8.6 (L) 8.9 - 10.3 mg/dL   Total Protein 7.0 6.5 - 8.1 g/dL   Albumin 2.8 (L) 3.5 - 5.0 g/dL   AST 15 15 - 41 U/L   ALT 13 0 - 44 U/L   Alkaline Phosphatase 101 38 - 126 U/L   Total Bilirubin 0.6 0.3 - 1.2 mg/dL   GFR, Estimated >40 >10 mL/min   Anion gap 8 5 - 15   CT HEAD WO CONTRAST ( )  Result Date: 01/12/2021 CLINICAL DATA:  Headache EXAM: CT HEAD WITHOUT CONTRAST TECHNIQUE: Contiguous axial images were obtained from the base of the skull through the vertex without intravenous contrast. COMPARISON:  01/09/2017 FINDINGS: Brain: No acute intracranial abnormality. Specifically, no hemorrhage, hydrocephalus, mass lesion, acute infarction, or significant intracranial injury. Vascular: No hyperdense vessel or unexpected calcification. Skull: No acute calvarial abnormality. Sinuses/Orbits: No acute findings Other: None IMPRESSION: Normal study. Electronically Signed   By: Charlett Nose M.D.   On: 01/12/2021 21:18   [place] MFM OB FOLLOW UP  Result Date:  01/01/2021 ----------------------------------------------------------------------  OBSTETRICS REPORT                       (Signed Final 01/01/2021 10:06 am) ---------------------------------------------------------------------- Patient Info  ID #:       [id number]                          D.O.B.:  March 16, 1995 (25 yrs)  Name:       Jessica Randall                 Visit Date: 01/01/2021 09:29 am ---------------------------------------------------------------------- Performed By  Attending:        Lin Landsman      Ref. Address:     [address]                    MD                                                             [place], [place],                                                             [place] [address]  Performed By:     Birdena Crandall         [facility]                                 [facility]                                                             [facility]  Referred By:      Nadara Mustard MD ---------------------------------------------------------------------- Orders  #  Description                           Code        Ordered By  1  [place] MFM OB FOLLOW UP                   [id number]    Lin Landsman ----------------------------------------------------------------------  #  Order #  Accession #                Episode #  1  [id number]                   [id number]                 [id number] ---------------------------------------------------------------------- Indications  Obesity complicating pregnancy, second         O99.212  trimester  Poor obstetric history: Previous               O09.299  preeclampsia / eclampsia/gestational HTN  History of stroke  Encounter for antenatal screening for          Z36.3  malformations  Low risk NIPS, XX  History of C-Section x 2  History of polydactyly  [redacted] weeks gestation of pregnancy                Z3A.29 ---------------------------------------------------------------------- Fetal Evaluation  Num Of Fetuses:         1  Fetal Heart Rate(bpm):  137  Cardiac Activity:       Observed  Presentation:           Cephalic  Placenta:               Rt fundal  P. Cord Insertion:      Visualized, central  Amniotic Fluid  AFI FV:      Within normal limits  AFI Sum(cm)     %Tile       Largest Pocket(cm)  14.9            52          6.4  RUQ(cm)       RLQ(cm)       LUQ(cm)        LLQ(cm)  3.1           6.4           1.9            3.5 ---------------------------------------------------------------------- Biometry  BPD:      72.1  mm     G. Age:  29w 0d         35  %    CI:        72.56   %    70 - 86                                                          FL/HC:       19.8   %    19.6 - 20.8  HC:      269.2  mm     G. Age:  29w 2d         27  %    HC/AC:      1.06        0.99 - 1.21  AC:      253.6  mm     G. Age:  29w 4d         61  %    FL/BPD:     73.8   %    71 - 87  FL:       53.2  mm     G. Age:  28w 2d  16  %    FL/AC:      21.0   %    20 - 24  Est. FW:    1329  gm    2 lb 15 oz      39  % ---------------------------------------------------------------------- OB History  Gravidity:    5         Term:   4  Living:       4 ---------------------------------------------------------------------- Gestational Age  LMP:           29w 0d        Date:  06/12/20                 EDD:   03/19/21  U/S Today:     29w 0d                                        EDD:   03/19/21  Best:          29w 0d     Det. By:  LMP  (06/12/20)          EDD:   03/19/21 ---------------------------------------------------------------------- Anatomy  Cranium:               Appears normal         Aortic Arch:            Previously seen  Cavum:                 Appears normal         Ductal Arch:            Previously seen  Ventricles:            Appears normal         Diaphragm:              Appears normal  Choroid Plexus:        Previously seen        Stomach:                Appears normal, left                                                                        sided  Cerebellum:            Previously seen        Abdomen:                Appears normal  Posterior Fossa:       Previously seen        Abdominal Wall:         Appears nml (cord                                                                        insert, abd wall)  Nuchal Fold:  Not applicable (>20    Cord Vessels:           Appears normal ([redacted]                         wks GA)                                        vessel cord)  Face:                  Appears normal         Kidneys:                Appear normal                         (orbits and profile)  Lips:                  Appears normal         Bladder:                 Appears normal  Thoracic:              Appears normal         Spine:                  Previously seen  Heart:                 Previously seen        Upper Extremities:      Previously seen  RVOT:                  Previously seen        Lower Extremities:      Previously seen  LVOT:                  Previously seen  Other:  Fetus appears to be female. Technicallly difficult due to advanced GA          and maternal habitus. ---------------------------------------------------------------------- Cervix Uterus Adnexa  Cervix  Not visualized (advanced GA >24wks)  Uterus  No abnormality visualized. ---------------------------------------------------------------------- Impression  Follow up growth due to an elevated BMI (35)  with prior  history of preeclampsia.  Normal interval growth with measurements consistent with  dates  Good fetal movement and amniotic fluid volume  Blood pressure is normal today of 121/78 mmHg. ---------------------------------------------------------------------- Recommendations  Follow up growth at 36 weeks ----------------------------------------------------------------------               Lin Landsman, MD Electronically Signed Final Report   01/01/2021 10:06 am ----------------------------------------------------------------------    Procedures: NST, CT  Discharge Condition: good  Disposition: Discharge disposition: 01-Home or Self Care       Diet: Regular diet  Discharge Activity: Activity as tolerated  Discharge Instructions     Discharge activity:  No Restrictions   Complete by: As directed    Discharge diet:  No restrictions   Complete by: As directed    No sexual activity restrictions   Complete by: As directed    Notify physician for a general feeling that "something is not right"   Complete by: As directed    Notify physician for increase or change in vaginal discharge   Complete by: As directed    Notify physician for intestinal cramps,  with or without  diarrhea, sometimes described as "gas pain"   Complete by: As directed    Notify physician for leaking of fluid   Complete by: As directed    Notify physician for low, dull backache, unrelieved by heat or Tylenol   Complete by: As directed    Notify physician for menstrual like cramps   Complete by: As directed    Notify physician for pelvic pressure   Complete by: As directed    Notify physician for uterine contractions.  These may be painless and feel like the uterus is tightening or the baby is  "balling up"   Complete by: As directed    Notify physician for vaginal bleeding   Complete by: As directed    PRETERM LABOR:  Includes any of the follwing symptoms that occur between 20 - [redacted] weeks gestation.  If these symptoms are not stopped, preterm labor can result in preterm delivery, placing your baby at risk   Complete by: As directed       Allergies as of 01/12/2021   No Known Allergies      Medication List     TAKE these medications    ferrous gluconate 240 (27 FE) MG tablet Commonly known as: FERGON Take 1 tablet (240 mg total) by mouth daily.   Prenate Mini 18-0.6-0.4-350 MG Caps Take 1 capsule by mouth daily.   prochlorperazine 10 MG tablet Commonly known as: COMPAZINE Take 1 tablet (10 mg total) by mouth every 6 (six) hours as needed for nausea (headache).   triamcinolone cream 0.1 % Commonly known as: KENALOG Apply 1 application topically 2 (two) times daily.         Total time spent taking care of this patient: 50 minutes  Signed: Vena Austria 01/12/2021, 11:05 PM

## 2021-01-21 ENCOUNTER — Encounter: Payer: Self-pay | Admitting: Advanced Practice Midwife

## 2021-01-21 ENCOUNTER — Other Ambulatory Visit: Payer: Self-pay

## 2021-01-21 ENCOUNTER — Other Ambulatory Visit: Payer: Medicaid Other

## 2021-01-21 ENCOUNTER — Ambulatory Visit (INDEPENDENT_AMBULATORY_CARE_PROVIDER_SITE_OTHER): Payer: Medicaid Other | Admitting: Advanced Practice Midwife

## 2021-01-21 VITALS — BP 120/70 | Wt 186.0 lb

## 2021-01-21 DIAGNOSIS — Z23 Encounter for immunization: Secondary | ICD-10-CM | POA: Diagnosis not present

## 2021-01-21 DIAGNOSIS — Z3A31 31 weeks gestation of pregnancy: Secondary | ICD-10-CM

## 2021-01-21 DIAGNOSIS — Z369 Encounter for antenatal screening, unspecified: Secondary | ICD-10-CM

## 2021-01-21 DIAGNOSIS — O0993 Supervision of high risk pregnancy, unspecified, third trimester: Secondary | ICD-10-CM

## 2021-01-21 DIAGNOSIS — O09293 Supervision of pregnancy with other poor reproductive or obstetric history, third trimester: Secondary | ICD-10-CM

## 2021-01-21 DIAGNOSIS — O34219 Maternal care for unspecified type scar from previous cesarean delivery: Secondary | ICD-10-CM

## 2021-01-21 DIAGNOSIS — O99213 Obesity complicating pregnancy, third trimester: Secondary | ICD-10-CM

## 2021-01-21 NOTE — Progress Notes (Signed)
Routine Prenatal Care Visit  Subjective  Jessica Randall is a 25 y.o. [id number] at [redacted]w[redacted]d being seen today for ongoing prenatal care.  She is currently monitored for the following issues for this high-risk pregnancy and has History of cesarean section complicating pregnancy; Supervision of high risk pregnancy in first trimester; History of cesarean delivery; Obesity affecting pregnancy in first trimester; Hx of preeclampsia, prior pregnancy, currently pregnant, first trimester; History of transient ischemic attack (TIA); Headache in pregnancy, third trimester; Hypokalemia; and Anemia affecting pregnancy in third trimester on their problem list.  ----------------------------------------------------------------------------------- Patient reports no complaints.   Contractions: Not present. Vag. Bleeding: None.  Movement: Present. Leaking Fluid denies.  ----------------------------------------------------------------------------------- The following portions of the patient's history were reviewed and updated as appropriate: allergies, current medications, past family history, past medical history, past social history, past surgical history and problem list. Problem list updated.  Objective  Blood pressure 120/70, weight 186 lb (84.4 kg), last menstrual period 06/12/2020, currently breastfeeding. Pregravid weight 180 lb (81.6 kg) Total Weight Gain 6 lb (2.722 kg) Urinalysis: Urine Protein    Urine Glucose    Fetal Status: Fetal Heart Rate (bpm): 141 Fundal Height: 32 cm Movement: Present     General:  Alert, oriented and cooperative. Patient is in no acute distress.  Skin: Skin is warm and dry. No rash noted.   Cardiovascular: Normal heart rate noted  Respiratory: Normal respiratory effort, no problems with respiration noted  Abdomen: Soft, gravid, appropriate for gestational age. Pain/Pressure: Absent     Pelvic:  Cervical exam deferred        Extremities: Normal range of motion.  Edema: None   Mental Status: Normal mood and affect. Normal behavior. Normal judgment and thought content.   Assessment   25 y.o. [id number] at [redacted]w[redacted]d by  03/19/2021, by Last Menstrual Period presenting for routine prenatal visit  Plan   pregnancy 5 Problems (from 08/28/20 to present)    Problem Noted Resolved   Anemia affecting pregnancy in third trimester 01/12/2021 by Vena Austria, MD No   Hx of preeclampsia, prior pregnancy, currently pregnant, first trimester 08/28/2020 by Tresea Mall, CNM No   Supervision of high risk pregnancy in first trimester 11/10/2018 by Oswaldo Conroy, CNM No   Overview Addendum 01/12/2021  5:14 PM by Vena Austria, MD    Nursing Staff Provider  Office Location  [facility] Dating  LMP = 11w [place]  Language  English Anatomy US  Normal at MFM  Flu Vaccine  01/04/2021 Genetic Screen  NIPS: Normal XX  TDaP vaccine    Hgb A1C or  GTT Early : Third trimester :   Covid    LAB RESULTS   Rhogam  N/A Blood Type A/Positive/-- (08/03 1540)   Feeding Plan Breast Antibody Negative (08/03 1540)  Contraception  Rubella 2.54 (08/03 1540)  Circumcision  RPR Non Reactive (08/03 1540)   Pediatrician   HBsAg Negative (08/03 1540)   Support Person Derrick HIV Non Reactive (08/03 1540)  Prenatal Classes  Varicella Non-Immune    GBS  (For PCN allergy, check sensitivities)   BTL Consent     VBAC Consent 2 svd's followed by 2 c/s's- desires VBAC Pap  2020 negative    Hgb Electro  AA  Pelvis Tested 7lbs CF      SMA     HepC negative            Preterm labor symptoms and general obstetric precautions including but not limited to vaginal bleeding, contractions, leaking  of fluid and fetal movement were reviewed in detail with the patient.    Return in about 2 weeks (around 02/04/2021) for rob.  Rod Can, CNM 01/21/2021 10:15 AM

## 2021-01-22 LAB — 28 WEEK RH+PANEL
Basophils Absolute: 0 10*3/uL (ref 0.0–0.2)
Basos: 1 %
EOS (ABSOLUTE): 0 10*3/uL (ref 0.0–0.4)
Eos: 1 %
Gestational Diabetes Screen: 100 mg/dL (ref 70–139)
HIV Screen 4th Generation wRfx: NONREACTIVE
Hematocrit: 30 % — ABNORMAL LOW (ref 34.0–46.6)
Hemoglobin: 10 g/dL — ABNORMAL LOW (ref 11.1–15.9)
Immature Grans (Abs): 0 10*3/uL (ref 0.0–0.1)
Immature Granulocytes: 0 %
Lymphocytes Absolute: 1.5 10*3/uL (ref 0.7–3.1)
Lymphs: 22 %
MCH: 26.7 pg (ref 26.6–33.0)
MCHC: 33.3 g/dL (ref 31.5–35.7)
MCV: 80 fL (ref 79–97)
Monocytes Absolute: 0.4 10*3/uL (ref 0.1–0.9)
Monocytes: 6 %
Neutrophils Absolute: 4.9 10*3/uL (ref 1.4–7.0)
Neutrophils: 70 %
Platelets: 275 10*3/uL (ref 150–450)
RBC: 3.74 x10E6/uL — ABNORMAL LOW (ref 3.77–5.28)
RDW: 11.6 % — ABNORMAL LOW (ref 11.7–15.4)
RPR Ser Ql: NONREACTIVE
WBC: 6.9 10*3/uL (ref 3.4–10.8)

## 2021-01-24 ENCOUNTER — Observation Stay
Admission: EM | Admit: 2021-01-24 | Discharge: 2021-01-24 | Disposition: A | Discharge status: Home / Self Care | Payer: Medicaid Other | Attending: Obstetrics and Gynecology | Admitting: Obstetrics and Gynecology

## 2021-01-24 ENCOUNTER — Other Ambulatory Visit: Payer: Self-pay

## 2021-01-24 ENCOUNTER — Telehealth: Payer: Self-pay | Admitting: Licensed Practical Nurse

## 2021-01-24 ENCOUNTER — Encounter: Payer: Self-pay | Admitting: Obstetrics and Gynecology

## 2021-01-24 DIAGNOSIS — O0991 Supervision of high risk pregnancy, unspecified, first trimester: Secondary | ICD-10-CM

## 2021-01-24 DIAGNOSIS — O4703 False labor before 37 completed weeks of gestation, third trimester: Secondary | ICD-10-CM | POA: Insufficient documentation

## 2021-01-24 DIAGNOSIS — Z3A32 32 weeks gestation of pregnancy: Secondary | ICD-10-CM | POA: Diagnosis not present

## 2021-01-24 DIAGNOSIS — O368130 Decreased fetal movements, third trimester, not applicable or unspecified: Secondary | ICD-10-CM | POA: Diagnosis present

## 2021-01-24 DIAGNOSIS — O99013 Anemia complicating pregnancy, third trimester: Secondary | ICD-10-CM

## 2021-01-24 DIAGNOSIS — O09291 Supervision of pregnancy with other poor reproductive or obstetric history, first trimester: Secondary | ICD-10-CM

## 2021-01-24 NOTE — Discharge Summary (Signed)
Physician Final Progress Note  Patient ID: Jessica Randall MRN: [id number] DOB/AGE: 1995/08/09 25 y.o.  Admit date: 01/24/2021 Admitting provider: Vena Austria, MD Discharge date: 01/24/2021   Admission Diagnoses: Labor and delivery indication care or intervention  Discharge Diagnoses:  Active Problems:   Labor and delivery indication for care or intervention  25 y.o. [id number] at [redacted]w[redacted]d by Estimated Date of Delivery: 03/19/21 presenting for decreased fetal movement and contractions.  Reports good fetal movement after presentation with reactive NST noted.  No LOF, no VB.  Temp:  [97.6 F (36.4 C)] 97.6 F (36.4 C) (12/22 1845) Pulse Rate:  [97] 97 (12/22 1845) Resp:  [18] 18 (12/22 1845) BP: (130)/(69) 130/69 (12/22 1845)  pregnancy 5 Problems (from 08/28/20 to present)     Problem Noted Resolved   Anemia affecting pregnancy in third trimester 01/12/2021 by Vena Austria, MD No   Hx of preeclampsia, prior pregnancy, currently pregnant, first trimester 08/28/2020 by Tresea Mall, CNM No   Supervision of high risk pregnancy in first trimester 11/10/2018 by Oswaldo Conroy, CNM No   Overview Addendum 01/12/2021  5:14 PM by Vena Austria, MD    Nursing Staff Provider  Office Location  [facility] Dating  LMP = 11w [place]  Language  English Anatomy US  Normal at MFM  Flu Vaccine  01/04/2021 Genetic Screen  NIPS: Normal XX  TDaP vaccine    Hgb A1C or  GTT Early : Third trimester :   Covid    LAB RESULTS   Rhogam  N/A Blood Type A/Positive/-- (08/03 1540)   Feeding Plan Breast Antibody Negative (08/03 1540)  Contraception  Rubella 2.54 (08/03 1540)  Circumcision  RPR Non Reactive (08/03 1540)   Pediatrician   HBsAg Negative (08/03 1540)   Support Person Derrick HIV Non Reactive (08/03 1540)  Prenatal Classes  Varicella Non-Immune    GBS  (For PCN allergy, check sensitivities)   BTL Consent     VBAC Consent 2 svd's followed by 2 c/s's- desires VBAC Pap  2020 negative     Hgb Electro  AA  Pelvis Tested 7lbs CF      SMA     HepC negative             Consults: None  Significant Findings/ Diagnostic Studies: none  Procedures:  Baseline: 150 Variability: moderate Accelerations: present Decelerations: absent Tocometry: none The patient was monitored for 30 minutes, fetal heart rate tracing was deemed reactive, category I tracing,  CPT [id number]   Discharge Condition: good  Disposition: Discharge disposition: 01-Home or Self Care       Diet: Regular diet  Discharge Activity: Activity as tolerated  Discharge Instructions     Discharge activity:  No Restrictions   Complete by: As directed    Discharge diet:  No restrictions   Complete by: As directed    No sexual activity restrictions   Complete by: As directed    Notify physician for a general feeling that "something is not right"   Complete by: As directed    Notify physician for increase or change in vaginal discharge   Complete by: As directed    Notify physician for intestinal cramps, with or without diarrhea, sometimes described as "gas pain"   Complete by: As directed    Notify physician for leaking of fluid   Complete by: As directed    Notify physician for low, dull backache, unrelieved by heat or Tylenol   Complete by: As directed    Notify  physician for menstrual like cramps   Complete by: As directed    Notify physician for pelvic pressure   Complete by: As directed    Notify physician for uterine contractions.  These may be painless and feel like the uterus is tightening or the baby is  "balling up"   Complete by: As directed    Notify physician for vaginal bleeding   Complete by: As directed    PRETERM LABOR:  Includes any of the follwing symptoms that occur between 20 - [redacted] weeks gestation.  If these symptoms are not stopped, preterm labor can result in preterm delivery, placing your baby at risk   Complete by: As directed       Allergies as of 01/24/2021   No  Known Allergies      Medication List     TAKE these medications    ferrous gluconate 240 (27 FE) MG tablet Commonly known as: FERGON Take 1 tablet (240 mg total) by mouth daily.   Prenate Mini 18-0.6-0.4-350 MG Caps Take 1 capsule by mouth daily.   prochlorperazine 10 MG tablet Commonly known as: COMPAZINE Take 1 tablet (10 mg total) by mouth every 6 (six) hours as needed for nausea (headache).   triamcinolone cream 0.1 % Commonly known as: KENALOG Apply 1 application topically 2 (two) times daily.         Total time spent taking care of this patient: triaged remotely   Signed: Vena Austria 01/24/2021, 7:12 PM

## 2021-01-24 NOTE — Telephone Encounter (Signed)
Jessica Randall, please advise if patient need to be seen here or L&D. Patient is 32 weeks OB.

## 2021-01-24 NOTE — OB Triage Note (Signed)
Pt c/o decreased fetal movement and abdominal. Pain in abdomen started around 16:00. Denies vaginal bleeding, and LOF

## 2021-01-24 NOTE — Telephone Encounter (Signed)
Called pt to get scheduled for today. No answer.  Left message for her call back.

## 2021-01-25 NOTE — Telephone Encounter (Signed)
Pt was seen in L&D.

## 2021-01-29 ENCOUNTER — Encounter: Payer: Self-pay | Admitting: Obstetrics & Gynecology

## 2021-01-29 ENCOUNTER — Observation Stay
Admission: EM | Admit: 2021-01-29 | Discharge: 2021-01-29 | Disposition: A | Discharge status: Home / Self Care | Payer: Medicaid Other | Attending: Obstetrics & Gynecology | Admitting: Obstetrics & Gynecology

## 2021-01-29 DIAGNOSIS — O2343 Unspecified infection of urinary tract in pregnancy, third trimester: Principal | ICD-10-CM | POA: Insufficient documentation

## 2021-01-29 DIAGNOSIS — B9689 Other specified bacterial agents as the cause of diseases classified elsewhere: Secondary | ICD-10-CM | POA: Diagnosis not present

## 2021-01-29 DIAGNOSIS — O26893 Other specified pregnancy related conditions, third trimester: Secondary | ICD-10-CM | POA: Diagnosis not present

## 2021-01-29 DIAGNOSIS — Z3A33 33 weeks gestation of pregnancy: Secondary | ICD-10-CM | POA: Diagnosis not present

## 2021-01-29 DIAGNOSIS — O0991 Supervision of high risk pregnancy, unspecified, first trimester: Secondary | ICD-10-CM

## 2021-01-29 DIAGNOSIS — N898 Other specified noninflammatory disorders of vagina: Secondary | ICD-10-CM | POA: Insufficient documentation

## 2021-01-29 DIAGNOSIS — O09291 Supervision of pregnancy with other poor reproductive or obstetric history, first trimester: Secondary | ICD-10-CM

## 2021-01-29 DIAGNOSIS — O99013 Anemia complicating pregnancy, third trimester: Secondary | ICD-10-CM

## 2021-01-29 DIAGNOSIS — N390 Urinary tract infection, site not specified: Secondary | ICD-10-CM

## 2021-01-29 LAB — URINALYSIS, ROUTINE W REFLEX MICROSCOPIC
Bilirubin Urine: NEGATIVE
Glucose, UA: NEGATIVE mg/dL
Hgb urine dipstick: NEGATIVE
Ketones, ur: 20 mg/dL — AB
Nitrite: NEGATIVE
Protein, ur: 30 mg/dL — AB
Specific Gravity, Urine: 1.024 (ref 1.005–1.030)
pH: 6 (ref 5.0–8.0)

## 2021-01-29 LAB — RUPTURE OF MEMBRANE (ROM)PLUS: Rom Plus: NEGATIVE

## 2021-01-29 MED ORDER — NITROFURANTOIN MONOHYD MACRO 100 MG PO CAPS: 100.0000 mg | ORAL_CAPSULE | Freq: Two times a day (BID) | ORAL | 0 refills | Status: DC

## 2021-01-29 MED ORDER — NITROFURANTOIN MONOHYD MACRO 100 MG PO CAPS
100.0000 mg | ORAL_CAPSULE | Freq: Two times a day (BID) | ORAL | Status: DC
  Administered 2021-01-29: 14:00:00 100 mg via ORAL

## 2021-01-29 MED ORDER — ONDANSETRON HCL 4 MG/2ML IJ SOLN: 4.0000 mg | Freq: Four times a day (QID) | INTRAMUSCULAR | Status: DC | PRN

## 2021-01-29 MED ORDER — LIDOCAINE HCL (PF) 1 % IJ SOLN: 30.0000 mL | INTRAMUSCULAR | Status: DC | PRN

## 2021-01-29 MED ORDER — NITROFURANTOIN MONOHYD MACRO 100 MG PO CAPS
ORAL_CAPSULE | ORAL | Status: AC
  Filled 2021-01-29: qty 1

## 2021-01-29 MED ORDER — ACETAMINOPHEN 325 MG PO TABS: 650.0000 mg | ORAL_TABLET | ORAL | Status: DC | PRN

## 2021-01-29 NOTE — OB Triage Note (Signed)
Pt presents c/o LOF  x 2 days. Pt denies bleeding. Pt denies pain but reports increased vaginal pressure. Pt reports clear watery discharge. Pt reports positive fetal movement. Pt denies recent intercourse. Pt is previous c/s x 2 and desires a repeat.  VSS. Will continue to monitor.

## 2021-01-29 NOTE — Final Progress Note (Signed)
Physician Final Progress Note  Patient ID: Jessica Randall MRN: [id number] DOB/AGE: [birth year] 25 y.o.  Admit date: 01/29/2021 Admitting provider: Nadara Mustard, MD Discharge date: 01/29/2021  Admission Diagnoses: UTI (urinary tract infection)  33 weeks pregnancy  Vaginal discharge  Discharge Diagnoses:  Principal Problem:   UTI (urinary tract infection)  33 weeks pregnancy  Vaginal discharge  Consults: None  Significant Findings/ Diagnostic Studies:  Obstetrics Admission History & Physical   No chief complaint on file.   HPI:  25 y.o. [id number] @ [redacted]w[redacted]d (03/19/2021, by Last Menstrual Period). Admitted on 01/29/2021:   Patient Active Problem List   Diagnosis Date Noted   UTI (urinary tract infection) 01/29/2021   Labor and delivery indication for care or intervention 01/24/2021   History of transient ischemic attack (TIA) 01/12/2021   Headache in pregnancy, third trimester 01/12/2021   Hypokalemia 01/12/2021   Anemia affecting pregnancy in third trimester 01/12/2021   Obesity affecting pregnancy in first trimester 08/28/2020   Hx of preeclampsia, prior pregnancy, currently pregnant, first trimester 08/28/2020   History of cesarean delivery 06/12/2019   Supervision of high risk pregnancy in first trimester 11/10/2018   History of cesarean section complicating pregnancy 06/18/2017     Presents for vaginal watery discharge at times over the last 2 days, denies pain or ctxs.  Some back pain.   Prenatal care at: at [facility]. Pregnancy complicated by none.  ROS: A review of systems was performed and negative, except as stated in the above HPI. PMHx:  Past Medical History:  Diagnosis Date   Anemia    Anxiety and depression    history of SI   Cholelithiasis affecting pregnancy in third trimester, antepartum 05/12/2017   GERD (gastroesophageal reflux disease)    History of drug use    MJ   History of gonorrhea    with G2   Pain in symphysis pubis during pregnancy  01/30/2017   Stroke-like symptoms 03/28/2015   TIA (transient ischemic attack) 03/28/2015   6 days after delivery of baby   PSHx:  Past Surgical History:  Procedure Laterality Date   CESAREAN SECTION N/A 06/18/2017   Procedure: CESAREAN SECTION;  Surgeon: Natale Milch, MD;  Location: [facility] ORS;  Service: Obstetrics;  Laterality: N/A;   CESAREAN SECTION N/A 06/13/2019   Procedure: CESAREAN SECTION;  Surgeon: Natale Milch, MD;  Location: [facility] ORS;  Service: Obstetrics;  Laterality: N/A;   CHOLECYSTECTOMY Right 05/15/2017   Medications:  Medications Prior to Admission  Medication Sig Dispense Refill Last Dose   triamcinolone cream (KENALOG) 0.1 % Apply 1 application topically 2 (two) times daily. 80 g 1 01/29/2021   ferrous gluconate (FERGON) 240 (27 FE) MG tablet Take 1 tablet (240 mg total) by mouth daily. (Patient not taking: Reported on 01/29/2021) 30 tablet 2 Unknown   Prenat-FeCbn-FeAsp-Meth-FA-DHA (PRENATE MINI) 18-0.6-0.4-350 MG CAPS Take 1 capsule by mouth daily. (Patient not taking: Reported on 01/29/2021) 30 capsule 11 Unknown   Allergies: has No Known Allergies. OBHx:  OB History  Gravida Para Term Preterm AB Living  5 4 4  0 0 4  SAB IAB Ectopic Multiple Live Births        0 4    # Outcome Date GA Lbr Len/2nd Weight Sex Delivery Anes PTL Lv  5 Current           4 Term 06/13/19 [redacted]w[redacted]d  3180 g M CS-LTranv Spinal  LIV     Birth Comments: extra digit on each hand  3  Term 06/18/17 [redacted]w[redacted]d  2650 g M CS-LTranv Gen  LIV     Complications: Non-reassuring electronic fetal monitoring tracing  2 Term 03/21/15 [redacted]w[redacted]d  3090 g M Vag-Spont  N LIV  1 Term 01/24/12 [redacted]w[redacted]d  3175 g M Vag-Spont  N LIV    Obstetric Comments  01/2012: 7lbs 3oz (Dr. Tiburcio Pea, [facility]) median episotomy; type of tear not documented.   2017 -postpartum preeclampsia   IFO:YDXAJOIN/OMVEHMCNOBSJ except as detailed in HPI.Marland Kitchen  No family history of birth defects. Soc Hx: Alcohol: none and Recreational  drug use: none  Objective:   Vitals:   01/29/21 1309  BP: 116/65  Pulse: 81  Resp: 18   Constitutional: Well nourished, well developed female in no acute distress.  HEENT: normal Skin: Warm and dry.  Cardiovascular:Regular rate and rhythm.   Extremity: trace to 1+ bilateral pedal edema Respiratory: Clear to auscultation bilateral. Normal respiratory effort Abdomen: gravid, ND, FHT present, mild tenderness on exam Back: no CVAT Neuro: DTRs 2+, Cranial nerves grossly intact Psych: Alert and Oriented x3. No memory deficits. Normal mood and affect.  MS: normal gait, normal bilateral lower extremity ROM/strength/stability.  Pelvic exam: is not limited by body habitus EGBUS: within normal limits Vagina: within normal limits and with normal mucosa Cervix: SSE: no pooling, closed cervix   SVE: no dilation Uterus: No contractions observed for 30 minutes.  Adnexa: not evaluated  FERN NEG ROM PLUS NEG  UA POS for UTI  Assessment & Plan:   25 y.o. [id number] @ [redacted]w[redacted]d, Admitted on 01/29/2021:UTI No s/sx PPROM    Macrobid F/U in office Sch CS for 27 weeks   Procedures: A NST procedure was performed with FHR monitoring and a normal baseline established, appropriate time of 20-40 minutes of evaluation, and accels >2 seen w 15x15 characteristics.  Results show a REACTIVE NST.    Discharge Condition: good  Disposition: Discharge disposition: 01-Home or Self Care       Diet: Regular diet  Discharge Activity: Activity as tolerated  Discharge Instructions     Call MD for:   Complete by: As directed    Worsening contractions or pain; leakage of fluid; bleeding.   Diet - low sodium heart healthy   Complete by: As directed    Increase activity slowly   Complete by: As directed       Allergies as of 01/29/2021   No Known Allergies      Medication List     STOP taking these medications    ferrous gluconate 240 (27 FE) MG tablet Commonly known as: FERGON        TAKE these medications    nitrofurantoin (macrocrystal-monohydrate) 100 MG capsule Commonly known as: MACROBID Take 1 capsule (100 mg total) by mouth every 12 (twelve) hours.   Prenate Mini 18-0.6-0.4-350 MG Caps Take 1 capsule by mouth daily.   triamcinolone cream 0.1 % Commonly known as: KENALOG Apply 1 application topically 2 (two) times daily.        Follow-up Information     [facility] Follow up.   Specialty: Obstetrics and Gynecology Why: As Scheduled Contact information: [address] [place] [address] [phone]                Total time spent taking care of this patient: 30 minutes  Signed: Letitia Libra 01/29/2021, 2:14 PM

## 2021-01-29 NOTE — Discharge Summary (Signed)
Rn reviewed discharge instructions with patient. Gave pt opportunity for questions. All questions answered at this time. Pt verbalized understanding. Pt discharged home  

## 2021-01-29 NOTE — Discharge Summary (Signed)
  See FPN 

## 2021-02-04 ENCOUNTER — Encounter: Payer: Self-pay | Admitting: Obstetrics and Gynecology

## 2021-02-04 ENCOUNTER — Other Ambulatory Visit: Payer: Self-pay | Admitting: Obstetrics and Gynecology

## 2021-02-04 MED ORDER — FERROUS SULFATE 325 (65 FE) MG PO TABS: 325.0000 mg | ORAL_TABLET | Freq: Every day | ORAL | 2 refills | Status: DC

## 2021-02-05 ENCOUNTER — Other Ambulatory Visit: Payer: Self-pay

## 2021-02-05 ENCOUNTER — Ambulatory Visit (INDEPENDENT_AMBULATORY_CARE_PROVIDER_SITE_OTHER): Payer: Medicaid Other | Admitting: Licensed Practical Nurse

## 2021-02-05 VITALS — BP 118/74 | Wt 185.0 lb

## 2021-02-05 DIAGNOSIS — Z3A34 34 weeks gestation of pregnancy: Secondary | ICD-10-CM

## 2021-02-05 DIAGNOSIS — O0993 Supervision of high risk pregnancy, unspecified, third trimester: Secondary | ICD-10-CM | POA: Diagnosis not present

## 2021-02-05 DIAGNOSIS — O0991 Supervision of high risk pregnancy, unspecified, first trimester: Secondary | ICD-10-CM

## 2021-02-05 NOTE — Progress Notes (Signed)
Routine Prenatal Care Visit  Subjective  Jessica Randall is a 26 y.o. [id number] at [redacted]w[redacted]d being seen today for ongoing prenatal care.  She is currently monitored for the following issues for this high-risk pregnancy and has History of cesarean section complicating pregnancy; Supervision of high risk pregnancy in first trimester; History of cesarean delivery; Obesity affecting pregnancy in first trimester; Hx of preeclampsia, prior pregnancy, currently pregnant, first trimester; History of transient ischemic attack (TIA); Headache in pregnancy, third trimester; Hypokalemia; Anemia affecting pregnancy in third trimester; Labor and delivery indication for care or intervention; and UTI (urinary tract infection) on their problem list.  ----------------------------------------------------------------------------------- Patient reports  -has general third trimester discomforts, reviewed common discomforts in the third trimester,  -expresses concern for FM, reports the fetus does not move very much, she was evaluated about 2 weeks ago for decreased FM, had a RNST and was told that the fetus is running our of room.  Discussed typical movement in the third trimester, sounds like over the last few weeks the fetus does not have a clear pattern of movement, it may happen at night and then again in the morning, stressed the importance of being evaluated if there is any concern regarding movement. Offered NST today-declined. -Lost her mucus plug last night, now wonders if her water is broken, has not seen any leakage of fluid, has had white discharge for most of her pregnancy, just worried that her water could be broken.  -Desires RLTCS with Tubal no longer desires VBAC   Contractions: Irritability.  .  Movement: Present. Leaking Fluid  see above  .  ----------------------------------------------------------------------------------- The following portions of the patient's history were reviewed and updated as  appropriate: allergies, current medications, past family history, past medical history, past social history, past surgical history and problem list. Problem list updated.  Objective  Blood pressure 118/74, weight 185 lb (83.9 kg), last menstrual period 06/12/2020, currently breastfeeding. Pregravid weight 180 lb (81.6 kg) Total Weight Gain 5 lb (2.268 kg) Urinalysis: Urine Protein    Urine Glucose    Fetal Status: Fetal Heart Rate (bpm): 140 Fundal Height: 36 cm Movement: Present     General:  Alert, oriented and cooperative. Patient is in no acute distress.  Skin: Skin is warm and dry. No rash noted.   Cardiovascular: Normal heart rate noted  Respiratory: Normal respiratory effort, no problems with respiration noted  Abdomen: Soft, gravid, appropriate for gestational age. Pain/Pressure: Present     Pelvic:  Cervical exam deferred       SSE: cervix closed, pink no lesions, physiologic discharge present Neg poling, Nitrazine and ferning   Extremities: Normal range of motion.     Mental Status: Normal mood and affect. Normal behavior. Normal judgment and thought content.   Assessment   26 y.o. [id number] at [redacted]w[redacted]d by  03/19/2021, by Last Menstrual Period presenting for routine prenatal visit  Plan   pregnancy 5 Problems (from 08/28/20 to present)     Problem Noted Resolved   UTI (urinary tract infection) 01/29/2021 by Nadara Mustard, MD No   Anemia affecting pregnancy in third trimester 01/12/2021 by Vena Austria, MD No   Hx of preeclampsia, prior pregnancy, currently pregnant, first trimester 08/28/2020 by Tresea Mall, CNM No   Supervision of high risk pregnancy in first trimester 11/10/2018 by Oswaldo Conroy, CNM No   Overview Addendum 02/04/2021 11:00 AM by Vena Austria, MD    Nursing Staff Provider  Office Location  [facility] Dating  LMP = 11w  [place]  Language  English Anatomy US  Normal at MFM  Flu Vaccine  01/04/2021 Genetic Screen  NIPS: Normal XX  TDaP vaccine    Hgb  A1C or  GTT Early : 133 Third trimester : 100  Covid    LAB RESULTS   Rhogam  N/A Blood Type A/Positive/-- (08/03 1540)   Feeding Plan Breast Antibody Negative (08/03 1540)  Contraception Tubal  Rubella 2.54 (08/03 1540)  Circumcision NA  RPR Non Reactive (08/03 1540)   Pediatrician   HBsAg Negative (08/03 1540)   Support Person Montine Circle, Mother HIV Non Reactive (08/03 1540)  Prenatal Classes  Varicella Non-Immune    GBS  (For PCN allergy, check sensitivities)   BTL Consent     VBAC Consent Declines  Pap  2020 negative    Hgb Electro  AA  Pelvis Tested 7lbs CF      SMA     HepC negative             Preterm labor symptoms and general obstetric precautions including but not limited to vaginal bleeding, contractions, leaking of fluid and fetal movement were reviewed in detail with the patient. Please refer to After Visit Summary for other counseling recommendations.   -RTCS with tubal scheduled on 2/7, message sent through Epic to scheduler and L and D called.  -Has 36wk growth [place] scheduled   Roberto Scales, [place], [facility]  02/05/21  12:19 PM

## 2021-02-05 NOTE — Progress Notes (Signed)
No vb. No lof. Pt thinks she lost mucous plug last night.

## 2021-02-07 ENCOUNTER — Telehealth: Payer: Self-pay | Admitting: Obstetrics and Gynecology

## 2021-02-07 NOTE — Telephone Encounter (Signed)
Okay to perform with lydia as the assistant. Thanks

## 2021-02-07 NOTE — Telephone Encounter (Signed)
Patient is aware of surgery scheduled on 03/12/21 w/ Dr. Jerene Pitch, H&P appointment on 1/30 @ 10:15am, Pre-admit Testing appointment to be scheduled, COVID testing to be scheduled, and Post-op on 2/15 @ 10:15am.

## 2021-02-07 NOTE — Telephone Encounter (Signed)
-----   Message from Ellwood Sayers, CNM sent at 02/05/2021  1:13 PM EST ----- Regarding: schedule c/s with Tubal Surgery Booking Request Patient Full Name:  Jessica Randall  MRN: [id number]  DOB: January 05, 1996  Surgeon: Ellouise Newer DOMINIC, CNM  Requested Surgery Date and Time: 03/12/2021 0730  Primary Diagnosis AND Code: repeat LTCS with tuabl  Secondary Diagnosis and Code:  Surgical Procedure: Cesarean section with tubal ligation RNFA Requested?:  L&D Notification: Yes Admission Status: surgery admit Length of Surgery: 75 min Special Case Needs: on Q pump  H&P:  Phone Interview???:   Interpreter: No Medical Clearance:   Special Scheduling Instructions: No Any known health/anesthesia issues, diabetes, sleep apnea, latex allergy, defibrillator/pacemaker?: Yes hx of TIA, was prescribed Lovenox PP but did not take Acuity: P1   (P1 highest, P2 delay may cause harm, P3 low, elective gyn, P4 lowest) Post op follow up visits:

## 2021-02-11 ENCOUNTER — Other Ambulatory Visit: Payer: Self-pay

## 2021-02-11 DIAGNOSIS — O09293 Supervision of pregnancy with other poor reproductive or obstetric history, third trimester: Secondary | ICD-10-CM

## 2021-02-11 DIAGNOSIS — Z8673 Personal history of transient ischemic attack (TIA), and cerebral infarction without residual deficits: Secondary | ICD-10-CM

## 2021-02-11 DIAGNOSIS — O99213 Obesity complicating pregnancy, third trimester: Secondary | ICD-10-CM

## 2021-02-11 DIAGNOSIS — Z98891 History of uterine scar from previous surgery: Secondary | ICD-10-CM

## 2021-02-12 ENCOUNTER — Other Ambulatory Visit: Payer: Self-pay

## 2021-02-12 ENCOUNTER — Encounter: Payer: Self-pay | Admitting: Obstetrics and Gynecology

## 2021-02-12 ENCOUNTER — Observation Stay
Admission: EM | Admit: 2021-02-12 | Discharge: 2021-02-12 | Disposition: A | Discharge status: Home / Self Care | Payer: Medicaid Other | Attending: Obstetrics and Gynecology | Admitting: Obstetrics and Gynecology

## 2021-02-12 ENCOUNTER — Ambulatory Visit (HOSPITAL_BASED_OUTPATIENT_CLINIC_OR_DEPARTMENT_OTHER): Payer: Medicaid Other

## 2021-02-12 VITALS — BP 134/85 | HR 82 | Temp 97.7°F | Ht 61.0 in | Wt 183.0 lb

## 2021-02-12 DIAGNOSIS — R519 Headache, unspecified: Secondary | ICD-10-CM | POA: Insufficient documentation

## 2021-02-12 DIAGNOSIS — O4703 False labor before 37 completed weeks of gestation, third trimester: Secondary | ICD-10-CM | POA: Insufficient documentation

## 2021-02-12 DIAGNOSIS — O99213 Obesity complicating pregnancy, third trimester: Secondary | ICD-10-CM | POA: Insufficient documentation

## 2021-02-12 DIAGNOSIS — Z8673 Personal history of transient ischemic attack (TIA), and cerebral infarction without residual deficits: Secondary | ICD-10-CM | POA: Insufficient documentation

## 2021-02-12 DIAGNOSIS — Z3A35 35 weeks gestation of pregnancy: Secondary | ICD-10-CM | POA: Diagnosis not present

## 2021-02-12 DIAGNOSIS — O99013 Anemia complicating pregnancy, third trimester: Secondary | ICD-10-CM

## 2021-02-12 DIAGNOSIS — O34219 Maternal care for unspecified type scar from previous cesarean delivery: Secondary | ICD-10-CM

## 2021-02-12 DIAGNOSIS — O26893 Other specified pregnancy related conditions, third trimester: Secondary | ICD-10-CM | POA: Diagnosis not present

## 2021-02-12 DIAGNOSIS — E669 Obesity, unspecified: Secondary | ICD-10-CM

## 2021-02-12 DIAGNOSIS — Z98891 History of uterine scar from previous surgery: Secondary | ICD-10-CM | POA: Insufficient documentation

## 2021-02-12 DIAGNOSIS — O09291 Supervision of pregnancy with other poor reproductive or obstetric history, first trimester: Secondary | ICD-10-CM

## 2021-02-12 DIAGNOSIS — O09293 Supervision of pregnancy with other poor reproductive or obstetric history, third trimester: Secondary | ICD-10-CM | POA: Insufficient documentation

## 2021-02-12 DIAGNOSIS — O0991 Supervision of high risk pregnancy, unspecified, first trimester: Secondary | ICD-10-CM

## 2021-02-12 MED ORDER — BUTALBITAL-APAP-CAFFEINE 50-325-40 MG PO TABS
1.0000 | ORAL_TABLET | Freq: Once | ORAL | Status: AC
  Administered 2021-02-12: 1 via ORAL
  Filled 2021-02-12: qty 1

## 2021-02-12 MED ORDER — TERBUTALINE SULFATE 1 MG/ML IJ SOLN
INTRAMUSCULAR | Status: AC
  Filled 2021-02-12: qty 1

## 2021-02-12 MED ORDER — TERBUTALINE SULFATE 1 MG/ML IJ SOLN
0.2500 mg | Freq: Once | INTRAMUSCULAR | Status: AC
  Administered 2021-02-12: 0.25 mg via SUBCUTANEOUS

## 2021-02-12 MED ORDER — ACETAMINOPHEN 325 MG PO TABS: 650.0000 mg | ORAL_TABLET | ORAL | Status: DC | PRN

## 2021-02-12 MED ORDER — SODIUM CHLORIDE 0.9 % IV BOLUS: 1000.0000 mL | Freq: Once | INTRAVENOUS | Status: DC

## 2021-02-12 NOTE — Discharge Summary (Signed)
Physician Final Progress Note  Patient ID: Jessica Randall MRN: [id number] DOB/AGE: 1995-05-20 26 y.o.  Admit date: 02/12/2021 Admitting provider: Malachy Mood, MD Discharge date: 02/12/2021   Admission Diagnoses: Labor and delivery   Discharge Diagnoses:  Active Problems:   Labor and delivery indication for care or intervention  26 y.o. [id number] at [redacted]w[redacted]d by Estimated Date of Delivery: 03/19/21 present for contractions.  Initially contracting regular every 3-17min, and rating these 10/10.  She also reported a headache.  Patient was checked and noted to be fingertip on presentation.  Patient reports not eating anything since 11:00AM.  Patient given IV fluid bolus terbutaline as well as fiorcicet.  Contractions spaced out on monitoring and cervix remained stable on recheck.  +FM, no LOF, no VB.  Blood pressure 128/72, pulse 79, temperature 98.8 F (37.1 C), resp. rate 16, height 5\' 1"  (1.549 m), weight 84 kg, last menstrual period 06/12/2020, currently breastfeeding.  pregnancy 5 Problems (from 08/28/20 to present)     Problem Noted Resolved   UTI (urinary tract infection) 01/29/2021 by Gae Dry, MD No   Anemia affecting pregnancy in third trimester 01/12/2021 by Malachy Mood, MD No   Hx of preeclampsia, prior pregnancy, currently pregnant, first trimester 08/28/2020 by Rod Can, CNM No   Supervision of high risk pregnancy in first trimester 11/10/2018 by Rexene Agent, CNM No   Overview Addendum 02/05/2021 12:22 PM by Allen Derry, [place] Staff Provider  Office Location  [facility] Dating  LMP = 11w [place]  Language  English Anatomy US  Normal at MFM  Flu Vaccine  01/04/2021 Genetic Screen  NIPS: Normal XX  TDaP vaccine    Hgb A1C or  GTT Early : 133 Third trimester : 100  Covid    LAB RESULTS   Rhogam  N/A Blood Type A/Positive/-- (08/03 1540)   Feeding Plan Breast Antibody Negative (08/03 1540)  Contraception Tubal  Rubella 2.54 (08/03 1540)   Circumcision NA RPR Non Reactive (08/03 1540)   Pediatrician   HBsAg Negative (08/03 1540)   Support Person Montine Circle, mother  HIV Non Reactive (08/03 1540)  Prenatal Classes multip Varicella Non-Immune    GBS  (For PCN allergy, check sensitivities)   BTL Consent 01/21/2021    VBAC Consent Desires RLTCS  Pap  2020 negative    Hgb Electro  AA  Pelvis Tested 7lbs CF      SMA     HepC negative            Consults: None  Significant Findings/ Diagnostic Studies: none  Procedures: NST 150, moderate, +accels, no decels Toco: initially q3-55min, spaced out to q18min on discharge  Discharge Condition: good  Disposition: Discharge disposition: 01-Home or Self Care       Diet: Regular diet  Discharge Activity: normal activity  Discharge Instructions     Discharge activity:  No Restrictions   Complete by: As directed    Discharge diet:  No restrictions   Complete by: As directed    Fetal Kick Count:  Lie on our left side for one hour after a meal, and count the number of times your baby kicks.  If it is less than 5 times, get up, move around and drink some juice.  Repeat the test 30 minutes later.  If it is still less than 5 kicks in an hour, notify your doctor.   Complete by: As directed    LABOR:  When conractions begin, you should start  to time them from the beginning of one contraction to the beginning  of the next.  When contractions are 5 - 10 minutes apart or less and have been regular for at least an hour, you should call your health care provider.   Complete by: As directed    No sexual activity restrictions   Complete by: As directed    Notify physician for bleeding from the vagina   Complete by: As directed    Notify physician for blurring of vision or spots before the eyes   Complete by: As directed    Notify physician for chills or fever   Complete by: As directed    Notify physician for fainting spells, "black outs" or loss of consciousness   Complete by: As  directed    Notify physician for increase in vaginal discharge   Complete by: As directed    Notify physician for leaking of fluid   Complete by: As directed    Notify physician for pain or burning when urinating   Complete by: As directed    Notify physician for pelvic pressure (sudden increase)   Complete by: As directed    Notify physician for severe or continued nausea or vomiting   Complete by: As directed    Notify physician for sudden gushing of fluid from the vagina (with or without continued leaking)   Complete by: As directed    Notify physician for sudden, constant, or occasional abdominal pain   Complete by: As directed    Notify physician if baby moving less than usual   Complete by: As directed       Allergies as of 02/12/2021   No Known Allergies      Medication List     TAKE these medications    ferrous sulfate 325 (65 FE) MG tablet Commonly known as: FerrouSul Take 1 tablet (325 mg total) by mouth daily.   nitrofurantoin (macrocrystal-monohydrate) 100 MG capsule Commonly known as: MACROBID Take 1 capsule (100 mg total) by mouth every 12 (twelve) hours.   Prenate Mini 18-0.6-0.4-350 MG Caps Take 1 capsule by mouth daily.   triamcinolone cream 0.1 % Commonly known as: KENALOG Apply 1 application topically 2 (two) times daily.         Total time spent taking care of this patient: 30 minutes  Signed: Malachy Mood 02/12/2021, 10:05 PM

## 2021-02-12 NOTE — OB Triage Note (Signed)
Pt is G5P4 at 35 weeks w/ c/o UCs and vaginal pressure since 1600. Also with a HA unrelieved by tylenol. Hx PIH requiring Mag and labetelol x all pregnancies. Pt also with h/o "stroke" with 1 pregnancy. Pt A&O x 4 with no acute distress. Denies discharge or vag bleeding. Pt thinks she may have lost mucous plug today.  FHR 145 +FM

## 2021-02-19 ENCOUNTER — Encounter: Payer: Medicaid Other | Admitting: Obstetrics

## 2021-03-01 ENCOUNTER — Inpatient Hospital Stay: Admission: RE | Admit: 2021-03-01 | Payer: Medicaid Other | Source: Ambulatory Visit

## 2021-03-04 ENCOUNTER — Encounter: Payer: Medicaid Other | Admitting: Obstetrics and Gynecology

## 2021-03-04 ENCOUNTER — Other Ambulatory Visit: Payer: Self-pay

## 2021-03-04 ENCOUNTER — Encounter: Payer: Self-pay | Admitting: Obstetrics and Gynecology

## 2021-03-04 ENCOUNTER — Ambulatory Visit (INDEPENDENT_AMBULATORY_CARE_PROVIDER_SITE_OTHER): Payer: Medicaid Other | Admitting: Obstetrics and Gynecology

## 2021-03-04 NOTE — Progress Notes (Signed)
Postpartum Visit  Chief Complaint:  Chief Complaint  Patient presents with   Postpartum Care    History of Present Illness: Patient is a 26 y.o. [id number] presents for postpartum visit.  Date of delivery: 02/14/2020 Type of delivery: cesarean section with tubal ligation  Breast Feeding:  yes Lochia: normal  Edinburgh Post-Partum Depression Score: see flow sheets Date of last PAP: 11/09/2018 NIL   She reports she has been feeling well. Did not want to follow up in [place] with [facility].  Newborn Details:  SINGLETON :  1. Infant Status: Infant doing well at home with mother.   Review of Systems: ROS  Past Medical History:  Past Medical History:  Diagnosis Date   Anemia    Anxiety and depression    history of SI   Cholelithiasis affecting pregnancy in third trimester, antepartum 05/12/2017   GERD (gastroesophageal reflux disease)    History of drug use    MJ   History of gonorrhea    with G2   Pain in symphysis pubis during pregnancy 01/30/2017   Stroke-like symptoms 03/28/2015   TIA (transient ischemic attack) 03/28/2015   6 days after delivery of baby    Past Surgical History:  Past Surgical History:  Procedure Laterality Date   CESAREAN SECTION N/A 06/18/2017   Procedure: CESAREAN SECTION;  Surgeon: Natale Milch, MD;  Location: [facility] ORS;  Service: Obstetrics;  Laterality: N/A;   CESAREAN SECTION N/A 06/13/2019   Procedure: CESAREAN SECTION;  Surgeon: Natale Milch, MD;  Location: [facility] ORS;  Service: Obstetrics;  Laterality: N/A;   CESAREAN SECTION WITH BILATERAL TUBAL LIGATION  02/13/2021   [facility]   CHOLECYSTECTOMY Right 05/15/2017    Family History:  Family History  Problem Relation Age of Onset   Lupus Mother    Hypertension Mother    Hyperthyroidism Mother    Hyperlipidemia Mother    Hypertension Maternal Grandmother    Diabetes Maternal Grandmother    Transient ischemic attack Maternal Grandmother    Hyperlipidemia Maternal Grandfather     Breast cancer Paternal Grandmother    Transient ischemic attack Paternal Grandmother     Social History:  Social History   Socioeconomic History   Marital status: Single    Spouse name: Tyrone   Number of children: 3   Years of education: 9   Highest education level: 9th grade  Occupational History   Occupation: food Designer, multimedia: [employer]   Occupation: Unemployed  Tobacco Use   Smoking status: Never   Smokeless tobacco: Never  Building services engineer Use: Never used  Substance and Sexual Activity   Alcohol use: No    Alcohol/week: 0.0 standard drinks   Drug use: No    Comment: h/o THC use   Sexual activity: Yes    Birth control/protection: Surgical    Comment: PLANNING SHOT  Other Topics Concern   Not on file  Social History Narrative   Not on file   Social Determinants of Health   Financial Resource Strain: Not on file  Food Insecurity: Not on file  Transportation Needs: Not on file  Physical Activity: Not on file  Stress: Not on file  Social Connections: Not on file  Intimate Partner Violence: Not on file    Allergies:  No Known Allergies  Medications: Prior to Admission medications   Medication Sig Start Date End Date Taking? Authorizing Provider  ferrous sulfate (FERROUSUL) 325 (65 FE) MG tablet Take 1 tablet (325 mg total) by  mouth daily. Patient not taking: Reported on 02/12/2021 02/04/21   Vena Austria, MD  nitrofurantoin, macrocrystal-monohydrate, (MACROBID) 100 MG capsule Take 1 capsule (100 mg total) by mouth every 12 (twelve) hours. Patient not taking: Reported on 02/12/2021 01/29/21   Nadara Mustard, MD  Prenat-FeCbn-FeAsp-Meth-FA-DHA (PRENATE MINI) 18-0.6-0.4-350 MG CAPS Take 1 capsule by mouth daily. Patient not taking: Reported on 03/04/2021 11/12/20   Mirna Mires, CNM  triamcinolone cream (KENALOG) 0.1 % Apply 1 application topically 2 (two) times daily. Patient not taking: Reported on 03/04/2021 01/04/21   Vena Austria, MD   enoxaparin (LOVENOX) 40 MG/0.4ML injection Inject 0.4 mLs (40 mg total) into the skin daily for 21 days. 06/23/19 08/09/19  Natale Milch, MD    Physical Exam Vitals:  Vitals:   03/04/21 1018  BP: 120/70    Physical Exam Constitutional:      Appearance: Normal appearance. She is well-developed.  HENT:     Head: Normocephalic and atraumatic.  Eyes:     Extraocular Movements: Extraocular movements intact.     Pupils: Pupils are equal, round, and reactive to light.  Neck:     Thyroid: No thyromegaly.  Cardiovascular:     Rate and Rhythm: Normal rate and regular rhythm.     Heart sounds: Normal heart sounds.  Pulmonary:     Effort: Pulmonary effort is normal.     Breath sounds: Normal breath sounds.  Abdominal:     General: Bowel sounds are normal. There is no distension.     Palpations: Abdomen is soft. There is no mass.     Comments: Incision is clean, dry, and intact. Steri strips removed  Musculoskeletal:     Cervical back: Neck supple.  Neurological:     Mental Status: She is alert and oriented to person, place, and time.  Skin:    General: Skin is warm and dry.  Psychiatric:        Behavior: Behavior normal.        Thought Content: Thought content normal.        Judgment: Judgment normal.  Vitals reviewed.    Assessment: 26 y.o. [id number] presenting for 6 week postpartum visit  Plan: Problem List Items Addressed This Visit   None Visit Diagnoses     Postpartum care and examination    -  Primary       1) Contraception-  tubal ligation  2)  Pap: up to date  3) Patient underwent screening for postpartum depression with no concerns noted.  - Follow up in 4 weeks   Adelene Idler MD, [facility], [facility] 03/04/2021 11:10 AM

## 2021-03-08 ENCOUNTER — Other Ambulatory Visit: Payer: Medicaid Other

## 2021-03-12 ENCOUNTER — Inpatient Hospital Stay: Admit: 2021-03-12 | Payer: Medicaid Other | Admitting: Obstetrics and Gynecology

## 2021-03-12 SURGERY — Surgical Case
Anesthesia: Choice | Laterality: Bilateral

## 2021-03-20 ENCOUNTER — Encounter: Payer: Medicaid Other | Admitting: Obstetrics and Gynecology

## 2021-04-02 ENCOUNTER — Telehealth: Payer: Self-pay

## 2021-04-02 NOTE — Telephone Encounter (Signed)
Pt states she wants to return to work tomorrow and asks that note be faxed to [phone].  Done.

## 2021-04-02 NOTE — Telephone Encounter (Signed)
Okay to give her a return to work note. No restrictions- thanks

## 2021-04-02 NOTE — Telephone Encounter (Signed)
Pt calling for note to return to work.  [phone]

## 2021-04-21 ENCOUNTER — Other Ambulatory Visit: Payer: Self-pay

## 2021-04-21 ENCOUNTER — Emergency Department: Payer: Medicaid Other

## 2021-04-21 ENCOUNTER — Emergency Department
Admission: EM | Admit: 2021-04-21 | Discharge: 2021-04-22 | Disposition: A | Discharge status: Home / Self Care | Payer: Medicaid Other | Attending: Emergency Medicine | Admitting: Emergency Medicine

## 2021-04-21 DIAGNOSIS — S4991XA Unspecified injury of right shoulder and upper arm, initial encounter: Secondary | ICD-10-CM | POA: Insufficient documentation

## 2021-04-21 DIAGNOSIS — M79644 Pain in right finger(s): Secondary | ICD-10-CM

## 2021-04-21 DIAGNOSIS — Y92410 Unspecified street and highway as the place of occurrence of the external cause: Secondary | ICD-10-CM | POA: Diagnosis not present

## 2021-04-21 DIAGNOSIS — S161XXA Strain of muscle, fascia and tendon at neck level, initial encounter: Secondary | ICD-10-CM | POA: Insufficient documentation

## 2021-04-21 DIAGNOSIS — S6991XA Unspecified injury of right wrist, hand and finger(s), initial encounter: Secondary | ICD-10-CM | POA: Diagnosis not present

## 2021-04-21 DIAGNOSIS — S199XXA Unspecified injury of neck, initial encounter: Secondary | ICD-10-CM | POA: Diagnosis present

## 2021-04-21 DIAGNOSIS — S4992XA Unspecified injury of left shoulder and upper arm, initial encounter: Secondary | ICD-10-CM | POA: Insufficient documentation
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
## 2021-04-22 MED ORDER — CYCLOBENZAPRINE HCL 5 MG PO TABS: ORAL_TABLET | ORAL | 0 refills | Status: DC

## 2021-04-22 MED ORDER — NAPROXEN 500 MG PO TABS: 500.0000 mg | ORAL_TABLET | Freq: Two times a day (BID) | ORAL | 0 refills | Status: DC

## 2021-04-22 MED ORDER — KETOROLAC TROMETHAMINE 60 MG/2ML IM SOLN
30.0000 mg | Freq: Once | INTRAMUSCULAR | Status: AC
  Administered 2021-04-22: 30 mg via INTRAMUSCULAR
  Filled 2021-04-22: qty 2

## 2021-04-22 MED ORDER — CYCLOBENZAPRINE HCL 10 MG PO TABS
5.0000 mg | ORAL_TABLET | Freq: Once | ORAL | Status: AC
  Administered 2021-04-22: 5 mg via ORAL
  Filled 2021-04-22: qty 1

## 2021-04-22 NOTE — ED Notes (Signed)
Discharge instructions, scripts and follow-up info reviewed with patient. Patient verbalized understanding. Patient ambulated with a steady gait out to the waiting room. ?
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
## 2021-04-22 NOTE — Discharge Instructions (Signed)
You may take medicines as needed for pain and muscle spasms (Naprosyn/Flexeril).  Apply moist heat to affected area several times daily for comfort.  Return to the ER for worsening symptoms, persistent vomiting, difficulty breathing, extremity weakness or other concerns. ?

## 2021-04-23 ENCOUNTER — Encounter: Payer: Medicaid Other | Admitting: Obstetrics & Gynecology

## 2021-11-27 ENCOUNTER — Other Ambulatory Visit: Payer: Self-pay

## 2021-11-27 ENCOUNTER — Telehealth: Payer: Self-pay

## 2021-11-27 NOTE — Telephone Encounter (Signed)
Pt calling; delivered in Jan at [facility]; had tubal; were tubes tied or taken out?  [phone]  Adv pt OP report read tubes were 'removed'.  Pt states she is two weeks late; adv to wait it out and see if she is just late starting; if doesn't start this cycle or the next to be evaluated.

## 2022-01-13 ENCOUNTER — Other Ambulatory Visit: Payer: Self-pay

## 2022-01-13 ENCOUNTER — Emergency Department
Admission: EM | Admit: 2022-01-13 | Discharge: 2022-01-13 | Disposition: A | Discharge status: Home / Self Care | Payer: Medicaid Other | Attending: Emergency Medicine | Admitting: Emergency Medicine

## 2022-01-13 ENCOUNTER — Encounter: Payer: Self-pay | Admitting: Emergency Medicine

## 2022-01-13 DIAGNOSIS — Z1152 Encounter for screening for COVID-19: Secondary | ICD-10-CM | POA: Insufficient documentation

## 2022-01-13 DIAGNOSIS — J029 Acute pharyngitis, unspecified: Secondary | ICD-10-CM | POA: Diagnosis present

## 2022-01-13 DIAGNOSIS — B349 Viral infection, unspecified: Secondary | ICD-10-CM | POA: Diagnosis not present

## 2022-01-13 LAB — RESP PANEL BY RT-PCR (RSV, FLU A&B, COVID)  RVPGX2
Influenza A by PCR: NEGATIVE
Influenza B by PCR: NEGATIVE
Resp Syncytial Virus by PCR: NEGATIVE
SARS Coronavirus 2 by RT PCR: NEGATIVE

## 2022-01-13 LAB — GROUP A STREP BY PCR: Group A Strep by PCR: NOT DETECTED

## 2022-01-13 MED ORDER — PSEUDOEPH-BROMPHEN-DM 30-2-10 MG/5ML PO SYRP: 5.0000 mL | ORAL_SOLUTION | Freq: Four times a day (QID) | ORAL | 0 refills | Status: AC | PRN

## 2022-01-13 NOTE — Discharge Instructions (Signed)
Follow-up with your primary care provider if any continued problems or concerns.  A prescription for Bromfed-DM was sent to the pharmacy to help with cough, congestion, posterior drainage.  Drink fluids frequently to stay hydrated which will also help with the drainage in your throat.  You may take Tylenol or ibuprofen as needed for throat pain, fever or bodyaches.

## 2022-01-13 NOTE — ED Provider Notes (Signed)
[facility] Provider Note    Event Date/Time   First MD Initiated Contact with Patient 01/13/22 [phone]     (approximate)   History   Sore Throat   HPI  Jessica Randall is a 26 y.o. female presents to the ED with complaint of nonproductive cough, congestion and sore throat.  Patient denies any known fever.  She is also here with her infant daughter who is recently diagnosed with RSV.  Patient is unaware of any other known sick contacts.  Patient has history of anemia, GERD, anxiety and depression.     Physical Exam   Triage Vital Signs: ED Triage Vitals [01/13/22 0915]  Enc Vitals Group     BP 113/66     Pulse Rate 83     Resp 20     Temp 98.4 F (36.9 C)     Temp Source Oral     SpO2 98 %     Weight 189 lb (85.7 kg)     Height 5' (1.524 m)     Head Circumference      Peak Flow      Pain Score 8     Pain Loc      Pain Edu?      Excl. in GC?     Most recent vital signs: Vitals:   01/13/22 0915  BP: 113/66  Pulse: 83  Resp: 20  Temp: 98.4 F (36.9 C)  SpO2: 98%     General: Awake, no distress.  Able to talk in complete sentences without any difficulty. CV:  Good peripheral perfusion.  Heart regular rate and rhythm. Resp:  Normal effort.  Lungs are clear bilaterally. Abd:  No distention.  Other:  Posterior pharynx without erythema or exudate.  Uvula is midline.  Neck is supple without cervical lymphadenopathy.   ED Results / Procedures / Treatments   Labs (all labs ordered are listed, but only abnormal results are displayed) Labs Reviewed  GROUP A STREP BY PCR  RESP PANEL BY RT-PCR (RSV, FLU A&B, COVID)  [id number]       PROCEDURES:  Critical Care performed:   Procedures   MEDICATIONS ORDERED IN ED: Medications - No data to display   IMPRESSION / MDM / ASSESSMENT AND PLAN / ED COURSE  I reviewed the triage vital signs and the nursing notes.   Differential diagnosis includes, but is not limited to,, strep  pharyngitis, viral pharyngitis, upper respiratory infection, influenza, COVID, RSV.  26 year old female presents to the ED with complaint of cough, congestion, sore throat for several days.  She is also here with her infant daughter who was recently diagnosed with RSV.  Patient was reassured with COVID, influenza, RSV and strep being negative.  A prescription for Bromfed-DM was sent to the pharmacy to take as needed for cough, congestion and posterior drainage.  Patient is strongly encouraged to increase fluids to stay hydrated.  Tylenol or ibuprofen if needed for throat pain and to follow-up with her PCP if any continued problems.      Patient's presentation is most consistent with acute complicated illness / injury requiring diagnostic workup.  FINAL CLINICAL IMPRESSION(S) / ED DIAGNOSES   Final diagnoses:  Viral illness     Rx / DC Orders   ED Discharge Orders          Ordered    brompheniramine-pseudoephedrine-DM 30-2-10 MG/5ML syrup  4 times daily PRN        01/13/22 1041  Note:  This document was prepared using Dragon voice recognition software and may include unintentional dictation errors.   Tommi Rumps, PA-C 01/13/22 1128    Phineas Semen, MD 01/13/22 1226

## 2022-01-13 NOTE — ED Triage Notes (Signed)
Pt in with co sore throat, congestion, no fever. Has had non productive cough.

## 2022-01-13 NOTE — ED Notes (Signed)
See triage note  Presents with body aches ,headache and sore throat  Afebrile on arrival

## 2022-06-05 ENCOUNTER — Other Ambulatory Visit: Payer: Self-pay

## 2022-06-05 ENCOUNTER — Encounter: Payer: Self-pay | Admitting: *Deleted

## 2022-06-05 ENCOUNTER — Emergency Department
Admission: EM | Admit: 2022-06-05 | Discharge: 2022-06-05 | Disposition: A | Discharge status: Home / Self Care | Payer: Medicaid Other | Attending: Emergency Medicine | Admitting: Emergency Medicine

## 2022-06-05 DIAGNOSIS — R200 Anesthesia of skin: Secondary | ICD-10-CM | POA: Diagnosis present

## 2022-06-05 DIAGNOSIS — Z48814 Encounter for surgical aftercare following surgery on the teeth or oral cavity: Secondary | ICD-10-CM | POA: Insufficient documentation

## 2022-06-05 LAB — COMPREHENSIVE METABOLIC PANEL
ALT: 19 U/L (ref 0–44)
AST: 19 U/L (ref 15–41)
Albumin: 4.1 g/dL (ref 3.5–5.0)
Alkaline Phosphatase: 62 U/L (ref 38–126)
Anion gap: 10 (ref 5–15)
BUN: 10 mg/dL (ref 6–20)
CO2: 23 mmol/L (ref 22–32)
Calcium: 9 mg/dL (ref 8.9–10.3)
Chloride: 104 mmol/L (ref 98–111)
Creatinine, Ser: 0.8 mg/dL (ref 0.44–1.00)
GFR, Estimated: >60 mL/min (ref >60)
Glucose, Bld: 88 mg/dL (ref 70–99)
Potassium: 3.2 mmol/L — ABNORMAL LOW (ref 3.5–5.1)
Sodium: 137 mmol/L (ref 135–145)
Total Bilirubin: 0.6 mg/dL (ref 0.3–1.2)
Total Protein: 7.9 g/dL (ref 6.5–8.1)

## 2022-06-05 LAB — CBC WITH DIFFERENTIAL/PLATELET
Abs Immature Granulocytes: 0.02 10*3/uL (ref 0.00–0.07)
Basophils Absolute: 0 10*3/uL (ref 0.0–0.1)
Basophils Relative: 0 %
Eosinophils Absolute: 0.3 10*3/uL (ref 0.0–0.5)
Eosinophils Relative: 3 %
HCT: 36.2 % (ref 36.0–46.0)
Hemoglobin: 11.4 g/dL — ABNORMAL LOW (ref 12.0–15.0)
Immature Granulocytes: 0 %
Lymphocytes Relative: 22 %
Lymphs Abs: 2.2 10*3/uL (ref 0.7–4.0)
MCH: 24.4 pg — ABNORMAL LOW (ref 26.0–34.0)
MCHC: 31.5 g/dL (ref 30.0–36.0)
MCV: 77.5 fL — ABNORMAL LOW (ref 80.0–100.0)
Monocytes Absolute: 0.6 10*3/uL (ref 0.1–1.0)
Monocytes Relative: 6 %
Neutro Abs: 6.8 10*3/uL (ref 1.7–7.7)
Neutrophils Relative %: 69 %
Platelets: 326 10*3/uL (ref 150–400)
RBC: 4.67 MIL/uL (ref 3.87–5.11)
RDW: 14.4 % (ref 11.5–15.5)
WBC: 9.9 10*3/uL (ref 4.0–10.5)
nRBC: 0 % (ref 0.0–0.2)

## 2022-06-05 MED ORDER — ACETAMINOPHEN 325 MG PO TABS
650.0000 mg | ORAL_TABLET | Freq: Once | ORAL | Status: AC
  Administered 2022-06-05: 650 mg via ORAL
  Filled 2022-06-05: qty 2

## 2022-06-05 NOTE — Discharge Instructions (Signed)
As the lidocaine is off your symptoms should resolve

## 2022-06-05 NOTE — ED Triage Notes (Addendum)
Pt brought in via ems from home.  Pt had left lower wisdom tooth pulled today at noon.  Pt reports facial numbness at 1630 today. Pt states hard to talk from numbness of pulling tooth today.    Pt reports slight headache.   Iv in place.  Dr Larinda Buttery in triage with pt.   Labs sent.  Pt  alert

## 2022-06-05 NOTE — ED Provider Notes (Signed)
[facility] Provider Note    Event Date/Time   First MD Initiated Contact with Patient 06/05/22 1832     (approximate)   History   facial numbness   HPI  Jessica Randall is a 27 y.o. female who had a tooth extraction performed over the left upper molar around noon today.  Lidocaine was used.  A couple hours after the procedure she noted that her upper lip would slide to the right making it difficult for her to speak.  She is concerned because in 2017 she had which she says were strokelike symptoms.  Review of records demonstrates the patient had right-sided weakness at that time but normal MRIs     Physical Exam   Triage Vital Signs: ED Triage Vitals  Enc Vitals Group     BP 06/05/22 1747 129/83     Pulse Rate 06/05/22 1747 84     Resp 06/05/22 1747 20     Temp 06/05/22 1747 99.3 F (37.4 C)     Temp Source 06/05/22 1747 Oral     SpO2 06/05/22 1747 98 %     Weight 06/05/22 1745 88.5 kg (195 lb)     Height 06/05/22 1745 1.524 m (5')     Head Circumference --      Peak Flow --      Pain Score 06/05/22 1745 0     Pain Loc --      Pain Edu? --      Excl. in GC? --     Most recent vital signs: Vitals:   06/05/22 1747 06/05/22 1916  BP: 129/83 124/86  Pulse: 84 73  Resp: 20 20  Temp: 99.3 F (37.4 C) 99 F (37.2 C)  SpO2: 98% 99%     General: Awake, no distress.  CV:  Good peripheral perfusion.  Resp:  Normal effort.  Abd:  No distention.  Other:  Patient's upper lip does shift to the right intermittently.  However when she smiles it is midline and normal.  Frequently it slides to the right when she speaks making it somewhat difficult for her to speak.  Cranial nerves otherwise normal.  Neuroexam otherwise normal   ED Results / Procedures / Treatments   Labs (all labs ordered are listed, but only abnormal results are displayed) Labs Reviewed  CBC WITH DIFFERENTIAL/PLATELET - Abnormal; Notable for the following components:       Result Value   Hemoglobin 11.4 (*)    MCV 77.5 (*)    MCH 24.4 (*)    All other components within normal limits  COMPREHENSIVE METABOLIC PANEL - Abnormal; Notable for the following components:   Potassium 3.2 (*)    All other components within normal limits  POC URINE PREG, ED     EKG  ED ECG REPORT I, Jene Every, the attending physician, personally viewed and interpreted this ECG.  Date: 06/05/2022  Rhythm: normal sinus rhythm QRS Axis: normal Intervals: normal ST/T Wave abnormalities: normal Narrative Interpretation: no evidence of acute ischemia    RADIOLOGY     PROCEDURES:  Critical Care performed:   Procedures   MEDICATIONS ORDERED IN ED: Medications  acetaminophen (TYLENOL) tablet 650 mg (650 mg Oral Given 06/05/22 1918)     IMPRESSION / MDM / ASSESSMENT AND PLAN / ED COURSE  I reviewed the triage vital signs and the nursing notes. Patient's presentation is most consistent with acute complicated illness / injury requiring diagnostic workup.  Patient presents with facial changes  as above.  On my exam not consistent with CVA, this is not a facial droop.  When the patient smiles her face is symmetric.  When she speaks her lip slides to the right and only her top lip.  But is then rapidly corrected.  This is likely an impact of the lidocaine used for her extraction.  Given her past I offered MRI but noted that likely only time would help symptoms get better secondary to the lidocaine.  She has opted for discharge at this time        FINAL CLINICAL IMPRESSION(S) / ED DIAGNOSES   Final diagnoses:  Encounter for surgical aftercare following surgery on the teeth or oral cavity     Rx / DC Orders   ED Discharge Orders     None        Note:  This document was prepared using Dragon voice recognition software and may include unintentional dictation errors.   Jene Every, MD 06/05/22 [phone]

## 2022-06-05 NOTE — ED Notes (Signed)
See triage note. Lower wisdom teeth pulled today. Facial numbness started at 1600. Difficulty speaking due to numbness and tooth pain starting to arise. A&Ox4.

## 2023-11-02 ENCOUNTER — Ambulatory Visit

## 2024-01-01 ENCOUNTER — Emergency Department

## 2024-01-01 ENCOUNTER — Encounter: Payer: Self-pay | Admitting: Emergency Medicine

## 2024-01-01 ENCOUNTER — Emergency Department
Admission: EM | Admit: 2024-01-01 | Discharge: 2024-01-01 | Disposition: A | Discharge status: Home / Self Care | Attending: Emergency Medicine | Admitting: Emergency Medicine

## 2024-01-01 ENCOUNTER — Other Ambulatory Visit: Payer: Self-pay

## 2024-01-01 DIAGNOSIS — R1031 Right lower quadrant pain: Secondary | ICD-10-CM | POA: Insufficient documentation

## 2024-01-01 DIAGNOSIS — R1032 Left lower quadrant pain: Secondary | ICD-10-CM | POA: Diagnosis not present

## 2024-01-01 DIAGNOSIS — N939 Abnormal uterine and vaginal bleeding, unspecified: Secondary | ICD-10-CM | POA: Diagnosis present

## 2024-01-01 DIAGNOSIS — R350 Frequency of micturition: Secondary | ICD-10-CM | POA: Diagnosis not present

## 2024-01-01 DIAGNOSIS — R102 Pelvic and perineal pain unspecified side: Secondary | ICD-10-CM | POA: Diagnosis not present

## 2024-01-01 DIAGNOSIS — R10A1 Flank pain, right side: Secondary | ICD-10-CM | POA: Diagnosis not present

## 2024-01-01 LAB — URINALYSIS, ROUTINE W REFLEX MICROSCOPIC
Bilirubin Urine: NEGATIVE
Glucose, UA: NEGATIVE mg/dL
Hgb urine dipstick: NEGATIVE
Ketones, ur: NEGATIVE mg/dL
Leukocytes,Ua: NEGATIVE
Nitrite: NEGATIVE
Protein, ur: NEGATIVE mg/dL
Specific Gravity, Urine: 1.023 (ref 1.005–1.030)
pH: 7 (ref 5.0–8.0)

## 2024-01-01 LAB — POC URINE PREG, ED: Preg Test, Ur: NEGATIVE

## 2024-01-01 MED ORDER — KETOROLAC TROMETHAMINE 15 MG/ML IJ SOLN
15.0000 mg | Freq: Once | INTRAMUSCULAR | Status: DC
  Filled 2024-01-01: qty 1

## 2024-01-01 MED ORDER — ACETAMINOPHEN 500 MG PO TABS
1000.0000 mg | ORAL_TABLET | Freq: Once | ORAL | Status: AC
  Administered 2024-01-01: 1000 mg via ORAL
  Filled 2024-01-01: qty 2

## 2024-01-01 MED ORDER — KETOROLAC TROMETHAMINE 15 MG/ML IJ SOLN
15.0000 mg | Freq: Once | INTRAMUSCULAR | Status: AC
  Administered 2024-01-01: 15 mg via INTRAMUSCULAR

## 2024-01-01 NOTE — ED Triage Notes (Signed)
 Pt reports right lower abd and flank pain x 2 weeks. Reports urinary frequency but denies pain w/ urination.

## 2024-01-01 NOTE — ED Provider Notes (Signed)
 [facility] Provider Note    Event Date/Time   First MD Initiated Contact with Patient 01/01/24 0101     (approximate)   History   Flank Pain and Urinary Frequency   HPI  Jessica Randall is a 28 y.o. female   Past medical history of cholecystectomy, tubal ligation, here with right-sided flank pain and bilateral lower abdominal crampy pain over the last 2 weeks.  Associated urinary frequency but no dysuria.  No fever.  No vomiting.  Bowel movements normal.  No GI bleeding.  She is usually regular with her periods but has had irregular menstrual periods lately, with vaginal spotting today.  She has had no vaginal discharge.  She is sexually active with 1 partner and had STI testing just last week, wants to defer further testing for today.  Denies history of kidney stones.   External Medical Documents Reviewed: Previous hospital notes      Physical Exam   Triage Vital Signs: ED Triage Vitals [01/01/24 0055]  Encounter Vitals Group     BP (!) 158/87     Girls Systolic BP Percentile      Girls Diastolic BP Percentile      Boys Systolic BP Percentile      Boys Diastolic BP Percentile      Pulse Rate 90     Resp 16     Temp 97.8 F (36.6 C)     Temp Source Oral     SpO2 100 %     Weight      Height      Head Circumference      Peak Flow      Pain Score 6     Pain Loc      Pain Education      Exclude from Growth Chart     Most recent vital signs: Vitals:   01/01/24 0055  BP: (!) 158/87  Pulse: 90  Resp: 16  Temp: 97.8 F (36.6 C)  SpO2: 100%    General: Awake, no distress.  CV:  Good peripheral perfusion.  Resp:  Normal effort.  Abd:  No distention.  Other:  No significant CVA tenderness no focal tenderness on abdominal exam, nonperitoneal as well.  Slightly hypertensive otherwise vital signs normal and is well-appearing nontoxic comfortable appearing patient.   ED Results / Procedures / Treatments   Labs (all labs  ordered are listed, but only abnormal results are displayed) Labs Reviewed  URINALYSIS, ROUTINE W REFLEX MICROSCOPIC - Abnormal; Notable for the following components:      Result Value   Color, Urine YELLOW (*)    APPearance HAZY (*)    All other components within normal limits  POC URINE PREG, ED     I ordered and reviewed the above labs they are notable for not pregnant, no bacteria or leukocyte on urine exam   RADIOLOGY I independently reviewed and interpreted CT of the abdomen pelvis and see no obvious obstructive or inflammatory changes I also reviewed radiologist's formal read.   PROCEDURES:  Critical Care performed: No  Procedures   MEDICATIONS ORDERED IN ED: Medications  acetaminophen  (TYLENOL ) tablet 1,000 mg (1,000 mg Oral Given 01/01/24 0215)  ketorolac  (TORADOL ) 15 MG/ML injection 15 mg (15 mg Intramuscular Given 01/01/24 0215)     IMPRESSION / MDM / ASSESSMENT AND PLAN / ED COURSE  I reviewed the triage vital signs and the nursing notes.  Patient's presentation is most consistent with acute presentation with potential threat to life or bodily function.  Differential diagnosis includes, but is not limited to, urinary tract infection/pyelonephritis, pregnancy related complications like ectopic pregnancy, STI, ovarian cyst or torsion, renal colic/kidney stone, appendicitis, choledocholithiasis or biliary problems, menstrual cramping pain   The patient is on the cardiac monitor to evaluate for evidence of arrhythmia and/or significant heart rate changes.  MDM:    28 year old with irregular periods, vaginal spotting today but fortunately not pregnant, rule out ectopic, may be menstrual cramping pain.    No evidence of urinary tract infection on urinalysis .  Consider renal colic given the crampy nature of the pain as well as right flank pain, will obtain CT of the abdomen pelvis without contrast for further  evaluation.  Considered choledocholithiasis but less likely given a very benign right upper quadrant exam and no associated nausea and vomiting.  CT will give further information.  Considered appendicitis but given no focal tenderness, unlikely.  CT will give further information.  Consider torsion but given bilateral nature of pain, subacute nature, less likely as well.  CT will give further information, will follow-up with Doppler only if there is ovarian enlargement noted on CT  Ultimately patient is very well appearing, stable, subacute nature of symptoms will be amenable for discharge if above workup is unremarkable but I will have her follow-up with gynecology clinic to further investigate her irregular periods and pelvic cramping pain.  -- CT unremarkable with no evidence of biliary dilation nor ovarian enlargement noted.  No other acute abnormalities aside from diverticulosis without evidence of diverticulitis.  Patient stable.  Plan will be for discharge with gynecology follow-up and return precautions given.         FINAL CLINICAL IMPRESSION(S) / ED DIAGNOSES   Final diagnoses:  Right flank pain  Pelvic cramping  Vaginal spotting     Rx / DC Orders   ED Discharge Orders     None        Note:  This document was prepared using Dragon voice recognition software and may include unintentional dictation errors.    Cyrena Mylar, MD 01/01/24 [phone]

## 2024-01-01 NOTE — Discharge Instructions (Signed)
 Fortunately we did not find any emergency conditions related to your abdominal pain today.  It may be related to your irregular periods and menstrual cramping pain.  Since you do not have a gynecologist I recommend you call Dr. Lovetta for an appointment.  Take acetaminophen  650 mg and ibuprofen  400 mg every 6 hours for pain.  Take with food. Thank you for choosing us  for your health care today!  Please see your primary doctor this week for a follow up appointment.   If you have any new, worsening, or unexpected symptoms call your doctor right away or come back to the emergency department for reevaluation.  It was my pleasure to care for you today.   Ginnie EDISON Cyrena, MD

## 2024-01-03 ENCOUNTER — Other Ambulatory Visit: Payer: Self-pay

## 2024-01-03 ENCOUNTER — Encounter: Payer: Self-pay | Admitting: Emergency Medicine

## 2024-01-03 ENCOUNTER — Emergency Department
Admission: EM | Admit: 2024-01-03 | Discharge: 2024-01-04 | Disposition: A | Attending: Emergency Medicine | Admitting: Emergency Medicine

## 2024-01-03 DIAGNOSIS — R10A2 Flank pain, left side: Secondary | ICD-10-CM

## 2024-01-03 DIAGNOSIS — N926 Irregular menstruation, unspecified: Secondary | ICD-10-CM | POA: Insufficient documentation

## 2024-01-03 DIAGNOSIS — R1030 Lower abdominal pain, unspecified: Secondary | ICD-10-CM | POA: Diagnosis present

## 2024-01-03 DIAGNOSIS — R1024 Suprapubic pain: Secondary | ICD-10-CM

## 2024-01-03 LAB — CBC
HCT: 39.5 % (ref 36.0–46.0)
Hemoglobin: 12.4 g/dL (ref 12.0–15.0)
MCH: 24.8 pg — ABNORMAL LOW (ref 26.0–34.0)
MCHC: 31.4 g/dL (ref 30.0–36.0)
MCV: 78.8 fL — ABNORMAL LOW (ref 80.0–100.0)
Platelets: 327 K/uL (ref 150–400)
RBC: 5.01 MIL/uL (ref 3.87–5.11)
RDW: 14.6 % (ref 11.5–15.5)
WBC: 7.7 K/uL (ref 4.0–10.5)
nRBC: 0 % (ref 0.0–0.2)

## 2024-01-03 LAB — URINALYSIS, ROUTINE W REFLEX MICROSCOPIC
Bilirubin Urine: NEGATIVE
Glucose, UA: NEGATIVE mg/dL
Hgb urine dipstick: NEGATIVE
Ketones, ur: NEGATIVE mg/dL
Leukocytes,Ua: NEGATIVE
Nitrite: NEGATIVE
Protein, ur: NEGATIVE mg/dL
Specific Gravity, Urine: 1.021 (ref 1.005–1.030)
pH: 5 (ref 5.0–8.0)

## 2024-01-03 LAB — COMPREHENSIVE METABOLIC PANEL WITH GFR
ALT: 25 U/L (ref 0–44)
AST: 19 U/L (ref 15–41)
Albumin: 4.3 g/dL (ref 3.5–5.0)
Alkaline Phosphatase: 82 U/L (ref 38–126)
Anion gap: 12 (ref 5–15)
BUN: 8 mg/dL (ref 6–20)
CO2: 23 mmol/L (ref 22–32)
Calcium: 9.4 mg/dL (ref 8.9–10.3)
Chloride: 102 mmol/L (ref 98–111)
Creatinine, Ser: 0.84 mg/dL (ref 0.44–1.00)
GFR, Estimated: >60 mL/min (ref >60)
Glucose, Bld: 102 mg/dL — ABNORMAL HIGH (ref 70–99)
Potassium: 3.7 mmol/L (ref 3.5–5.1)
Sodium: 137 mmol/L (ref 135–145)
Total Bilirubin: 0.3 mg/dL (ref 0.0–1.2)
Total Protein: 8 g/dL (ref 6.5–8.1)

## 2024-01-03 LAB — POC URINE PREG, ED: Preg Test, Ur: NEGATIVE

## 2024-01-03 LAB — LIPASE, BLOOD: Lipase: 31 U/L (ref 11–51)

## 2024-01-03 NOTE — ED Provider Notes (Signed)
 [facility] Provider Note   Event Date/Time   First MD Initiated Contact with Patient 01/03/24 2258     (approximate) History  Abdominal Pain  HPI Jessica Randall is a 28 y.o. female with a stated past medical history of tubal ligation, anxiety/depression, and cholecystectomy who presents complaining of of suprapubic abdominal pain with left-sided flank pain.  Patient states that the symptoms have been present for the last 3 weeks, intermittent over that time, and nonradiating.  Patient denies any exacerbating or relieving factors.  Patient denies any association with bowel habits, urination, or p.o. intake.  Patient denies any dark tarry stools or bright red blood.  Patient does endorse abnormal vaginal bleeding with a small amount of spotting at the beginning of this month however has not had a period since approximately 10/18.  Patient is sexually active with 1 female partner and was recently tested for STI. ROS: Patient currently denies any vision changes, tinnitus, difficulty speaking, facial droop, sore throat, chest pain, shortness of breath, nausea/vomiting/diarrhea, dysuria, or weakness/numbness/paresthesias in any extremity   Physical Exam  Triage Vital Signs: ED Triage Vitals  Encounter Vitals Group     BP 01/03/24 2016 131/82     Girls Systolic BP Percentile --      Girls Diastolic BP Percentile --      Boys Systolic BP Percentile --      Boys Diastolic BP Percentile --      Pulse Rate 01/03/24 2016 86     Resp 01/03/24 2016 17     Temp 01/03/24 2016 98.3 F (36.8 C)     Temp Source 01/03/24 2016 Oral     SpO2 01/03/24 2016 100 %     Weight --      Height --      Head Circumference --      Peak Flow --      Pain Score 01/03/24 2015 7     Pain Loc --      Pain Education --      Exclude from Growth Chart --    Most recent vital signs: Vitals:   01/03/24 2016  BP: 131/82  Pulse: 86  Resp: 17  Temp: 98.3 F (36.8 C)  SpO2: 100%    General: Awake, oriented x4. CV:  Good peripheral perfusion. Resp:  Normal effort. Abd:  No distention.  Mild suprapubic tenderness to palpation Other:  Middle-aged obese African-American female resting comfortably in no acute distress ED Results / Procedures / Treatments  Labs (all labs ordered are listed, but only abnormal results are displayed) Labs Reviewed  COMPREHENSIVE METABOLIC PANEL WITH GFR - Abnormal; Notable for the following components:      Result Value   Glucose, Bld 102 (*)    All other components within normal limits  CBC - Abnormal; Notable for the following components:   MCV 78.8 (*)    MCH 24.8 (*)    All other components within normal limits  URINALYSIS, ROUTINE W REFLEX MICROSCOPIC - Abnormal; Notable for the following components:   Color, Urine YELLOW (*)    APPearance HAZY (*)    All other components within normal limits  LIPASE, BLOOD  POC URINE PREG, ED   PROCEDURES: Critical Care performed: No Procedures MEDICATIONS ORDERED IN ED: Medications - No data to display IMPRESSION / MDM / ASSESSMENT AND PLAN / ED COURSE  I reviewed the triage vital signs and the nursing notes.  The patient is on the cardiac monitor to evaluate for evidence of arrhythmia and/or significant heart rate changes. Patient's presentation is most consistent with acute presentation with potential threat to life or bodily function. Patient is a 28 year old female that presents for the second time in 3 days complaining of suprapubic abdominal pain as well as left flank pain that is been present over the last 3 weeks. DDx: Abnormal vaginal bleeding, ectopic pregnancy, UTI, diverticulitis Plan: CBC, CMP, UA, lipase, urine pregnancy  Laboratory evaluation does not show any evidence of acute abnormalities.  Patient's urine pregnancy is negative.  At this time I have low suspicion for emergent diagnosis for patient's abdominal pain/flank pain.  I discussed  with patient the need for OB/GYN follow-up for her abnormal uterine bleeding as well as PCP follow-up for continued abdominal pain.  Referrals were placed for each.  Patient agrees with plan for discharge at this time with outpatient follow-up with OB/GYN and PCP.  All questions were answered and patient given strict return precautions prior to discharge  Dispo: Discharge home with PCP and OB/GYN follow-up   FINAL CLINICAL IMPRESSION(S) / ED DIAGNOSES   Final diagnoses:  Suprapubic pain  Left flank pain  Irregular menstrual bleeding   Rx / DC Orders   ED Discharge Orders          Ordered    Ambulatory Referral to Primary Care (Establish Care)        01/03/24 2307    Ambulatory referral to Obstetrics / Gynecology        01/03/24 2307           Note:  This document was prepared using Dragon voice recognition software and may include unintentional dictation errors.   Velicia Dejager K, MD 01/03/24 [phone]

## 2024-01-03 NOTE — ED Triage Notes (Signed)
 Pt reports lower abd pain & flank pain x 3 weeks now. Reports she was seen for same recently. Reports she also has not had a menstrual cycle but has gotten her tubes tied. Reports nausea but no emesis.

## 2024-01-04 ENCOUNTER — Telehealth: Payer: Self-pay

## 2024-01-04 ENCOUNTER — Emergency Department

## 2024-01-04 NOTE — ED Notes (Signed)
 Bedside US completed

## 2024-01-04 NOTE — ED Notes (Signed)
 A/O x4. No N/V. Was concerned about lower abd cramping--US  neg. Understands dc instructions. Gait steady.

## 2024-01-04 NOTE — Telephone Encounter (Signed)
 TRIAGE VOICEMAIL: Patient states she had a missed period and a lot of pain. She had a tubal ~2.5 years ago. She went to ED last night. Urinalysis and pregnancy test were negative. She is still in a lot of pain on the right side which comes and goes.

## 2024-01-12 ENCOUNTER — Ambulatory Visit: Admitting: Obstetrics & Gynecology

## 2024-01-12 ENCOUNTER — Other Ambulatory Visit (HOSPITAL_COMMUNITY)
Admission: RE | Admit: 2024-01-12 | Discharge: 2024-01-12 | Disposition: A | Discharge status: Home / Self Care | Source: Ambulatory Visit | Attending: Obstetrics & Gynecology | Admitting: Obstetrics & Gynecology

## 2024-01-12 ENCOUNTER — Encounter: Payer: Self-pay | Admitting: Obstetrics & Gynecology

## 2024-01-12 VITALS — BP 103/72 | HR 77 | Ht 61.0 in | Wt 211.4 lb

## 2024-01-12 DIAGNOSIS — Z124 Encounter for screening for malignant neoplasm of cervix: Secondary | ICD-10-CM

## 2024-01-12 DIAGNOSIS — N926 Irregular menstruation, unspecified: Secondary | ICD-10-CM

## 2024-01-12 MED ORDER — MEDROXYPROGESTERONE ACETATE 10 MG PO TABS: ORAL_TABLET | ORAL | 2 refills | Status: AC

## 2024-01-12 NOTE — Progress Notes (Signed)
    GYNECOLOGY PROGRESS NOTE  Subjective:    Patient ID: Jessica Randall, female    DOB: 1995-03-11, 28 y.o.   MRN: [id number]  HPI  Patient is a 28 y.o. single  [id number] (11, 8, 6, 4, and 2 yo kids) here today as a new patient. She is following up from ER visits on 11/28 and 01/03/2024. She was there for Right flank and pelvis pain. She had a normal CT and pelvic ultrasound. She had a negative HCG and WBC/HBG. She had a BTL after the birth of her last child 2 years ago. She reports her LMP was 11/18/2023. They are normally every month, not normal for her to skip a period. She reports negative STI screening at Urgent Care in 11/2023.  She is currently sexually active, monogamous for more than a year.  The following portions of the patient's history were reviewed and updated as appropriate: allergies, current medications, past family history, past medical history, past social history, past surgical history, and problem list.  Review of Systems Pertinent items are noted in HPI.  Her last pap was in 2020, normal, denies h/o abnormal paps.   Objective:   There were no vitals taken for this visit. There is no height or weight on file to calculate BMI. Well nourished, well hydrated Black female, no apparent distress She is ambulating and conversing normally. EG- normal Speculum exam normal  Assessment:  Missed period x 2 months Screening for cervical cancer   Plan:  Pap smear Check TSH and prolactin Cyclic provera  10 mg x 10 days x 3 months

## 2024-01-13 LAB — CYTOLOGY - PAP
Adequacy: ABSENT
Diagnosis: NEGATIVE

## 2024-01-13 LAB — TSH: TSH: 1.37 u[IU]/mL (ref 0.450–4.500)

## 2024-01-13 LAB — PROLACTIN: Prolactin: 6 ng/mL (ref 4.8–33.4)

## 2024-01-14 NOTE — Telephone Encounter (Signed)
 Patient was contacted and appointment scheduled. She has been seen.

## 2024-01-18 ENCOUNTER — Ambulatory Visit: Payer: Self-pay | Admitting: Obstetrics & Gynecology
